# Patient Record
Sex: Male | Born: 1950
Health system: Southern US, Community
[De-identification: ages and names within clinical notes are randomized; demographics above are authoritative.]

## PROBLEM LIST (undated history)

## (undated) DIAGNOSIS — E291 Testicular hypofunction: Secondary | ICD-10-CM

## (undated) DIAGNOSIS — C61 Malignant neoplasm of prostate: Secondary | ICD-10-CM

## (undated) DIAGNOSIS — C801 Malignant (primary) neoplasm, unspecified: Secondary | ICD-10-CM

## (undated) DIAGNOSIS — N200 Calculus of kidney: Secondary | ICD-10-CM

## (undated) DIAGNOSIS — E669 Obesity, unspecified: Secondary | ICD-10-CM

## (undated) DIAGNOSIS — G5600 Carpal tunnel syndrome, unspecified upper limb: Secondary | ICD-10-CM

## (undated) DIAGNOSIS — I10 Essential (primary) hypertension: Secondary | ICD-10-CM

## (undated) DIAGNOSIS — N182 Chronic kidney disease, stage 2 (mild): Secondary | ICD-10-CM

## (undated) DIAGNOSIS — Z87442 Personal history of urinary calculi: Secondary | ICD-10-CM

## (undated) DIAGNOSIS — E785 Hyperlipidemia, unspecified: Secondary | ICD-10-CM

## (undated) DIAGNOSIS — E119 Type 2 diabetes mellitus without complications: Secondary | ICD-10-CM

## (undated) HISTORY — DX: Hyperlipidemia, unspecified: E78.5

## (undated) HISTORY — DX: Personal history of urinary calculi: Z87.442

## (undated) HISTORY — DX: Testicular hypofunction: E29.1

## (undated) HISTORY — DX: Essential (primary) hypertension: I10

## (undated) HISTORY — DX: Malignant neoplasm of prostate: C61

## (undated) HISTORY — DX: Malignant (primary) neoplasm, unspecified: C80.1

## (undated) HISTORY — DX: Calculus of kidney: N20.0

## (undated) HISTORY — DX: Obesity, unspecified: E66.9

## (undated) HISTORY — DX: Type 2 diabetes mellitus without complications: E11.9

## (undated) HISTORY — DX: Chronic kidney disease, stage 2 (mild): N18.2

## (undated) HISTORY — PX: COLONOSCOPY: SHX174

## (undated) HISTORY — PX: LITHOTRIPSY: SUR834

---
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
## 2008-05-22 DIAGNOSIS — R6882 Decreased libido: Secondary | ICD-10-CM | POA: Insufficient documentation

## 2008-06-05 DIAGNOSIS — E291 Testicular hypofunction: Secondary | ICD-10-CM | POA: Insufficient documentation

## 2009-06-16 DIAGNOSIS — I10 Essential (primary) hypertension: Secondary | ICD-10-CM | POA: Insufficient documentation

## 2009-09-11 ENCOUNTER — Ambulatory Visit: Payer: Self-pay | Admitting: Surgery

## 2009-09-17 ENCOUNTER — Ambulatory Visit: Payer: Self-pay | Admitting: Surgery

## 2009-09-18 LAB — PATHOLOGY REPORT

## 2012-09-20 ENCOUNTER — Emergency Department: Payer: Self-pay | Admitting: Emergency Medicine

## 2012-09-20 LAB — COMPREHENSIVE METABOLIC PANEL
Albumin: 4.3 g/dL (ref 3.4–5.0)
Alkaline Phosphatase: 65 U/L (ref 50–136)
Anion Gap: 5 — ABNORMAL LOW (ref 7–16)
BUN: 23 mg/dL — ABNORMAL HIGH (ref 7–18)
Bilirubin,Total: 0.5 mg/dL (ref 0.2–1.0)
Calcium, Total: 9.4 mg/dL (ref 8.5–10.1)
Chloride: 104 mmol/L (ref 98–107)
Co2: 29 mmol/L (ref 21–32)
Creatinine: 1.83 mg/dL — ABNORMAL HIGH (ref 0.60–1.30)
EGFR (African American): 46 — ABNORMAL LOW
EGFR (Non-African Amer.): 39 — ABNORMAL LOW
Glucose: 166 mg/dL — ABNORMAL HIGH (ref 65–99)
Osmolality: 283 (ref 275–301)
Potassium: 4.4 mmol/L (ref 3.5–5.1)
SGOT(AST): 43 U/L — ABNORMAL HIGH (ref 15–37)
SGPT (ALT): 30 U/L (ref 12–78)
Sodium: 138 mmol/L (ref 136–145)
Total Protein: 7.8 g/dL (ref 6.4–8.2)

## 2012-09-20 LAB — URINALYSIS, COMPLETE
Bacteria: NONE SEEN
Bilirubin,UR: NEGATIVE
Glucose,UR: NEGATIVE mg/dL (ref 0–75)
Ketone: NEGATIVE
Nitrite: NEGATIVE
Ph: 6 (ref 4.5–8.0)
Protein: 25
RBC,UR: 1018 /HPF (ref 0–5)
Specific Gravity: 1.01 (ref 1.003–1.030)
Squamous Epithelial: <1
WBC UR: 11 /HPF (ref 0–5)

## 2012-09-20 LAB — CBC
HCT: 45.5 % (ref 40.0–52.0)
HGB: 15.2 g/dL (ref 13.0–18.0)
MCH: 27 pg (ref 26.0–34.0)
MCHC: 33.3 g/dL (ref 32.0–36.0)
MCV: 81 fL (ref 80–100)
Platelet: 256 10*3/uL (ref 150–440)
RBC: 5.62 10*6/uL (ref 4.40–5.90)
RDW: 13.8 % (ref 11.5–14.5)
WBC: 11.6 10*3/uL — ABNORMAL HIGH (ref 3.8–10.6)

## 2012-09-20 LAB — LIPASE, BLOOD: Lipase: 71 U/L — ABNORMAL LOW (ref 73–393)

## 2012-09-21 ENCOUNTER — Ambulatory Visit: Payer: Self-pay | Admitting: Urology

## 2012-10-06 ENCOUNTER — Ambulatory Visit: Payer: Self-pay | Admitting: Urology

## 2012-10-08 DIAGNOSIS — N200 Calculus of kidney: Secondary | ICD-10-CM | POA: Insufficient documentation

## 2014-04-26 NOTE — Consult Note (Signed)
PATIENT NAME:  John Benson, John Benson MR#:  [id number] DATE OF BIRTH:  1950-07-31  DATE OF CONSULTATION:  09/20/2012  REFERRING PHYSICIAN:  Dr. Thomasene Lot CONSULTING PHYSICIAN:  Nicki Reaper C. Bernardo Heater, MD  PLACE OF CONSULTATION:  Emergency Department.  REASON FOR CONSULTATION:  Right ureteral calculus.   HISTORY OF PRESENT ILLNESS:  A 64 year old male who states early this morning he had acute onset of severe right flank pain that radiated to the right upper quadrant. Pain was described as intermittent sharp and stabbing and associated with nausea and vomiting. There were no identifiable precipitating, aggravating or alleviating factors. He denies frequency, urgency or dysuria. Due to severity of pain, he presented to the Emergency Department and a stone protocol CT of the abdomen and pelvis was performed which was remarkable for a 7 x 10 mm right UPJ stone with hydronephrosis, renal edema and perinephric stranding. He was also noted to have bilateral, nonobstructing renal calculi. He denies any previous history of urologic problems or stone disease. He has noted since December 2013, intermittent episodes of nausea and vomiting and occasional small amount of hematuria. His pain is presently controlled.   PAST MEDICAL HISTORY: 1.  Hypertension.  2.  Type 2 diabetes mellitus-after each on no medications. 3.  External hemorrhoidectomy 2011.  PAST SURGICAL HISTORY:  None.   MEDICATIONS ON ADMISSION:  None.  ALLERGIES:  TESTOSTERONE GEL (HIVES).   SOCIAL HISTORY:  The patient is married. No tobacco or alcohol use.   REVIEW OF SYSTEMS: GENERAL:  Denies fevers, chills  EYES:  No visual changes.  HEENT:  No tinnitus, compose nasal drip.  RESPIRATORY:  Denies cough, shortness of breath, wheezing.  CARDIOVASCULAR:  History of hypertension, on no medications. No chest pain, palpitations.  GASTROINTESTINAL: Positive nausea, vomiting, abdominal pain as per the HPI.  GENITOURINARY:  As per the HPI.  ENDOCRINE:  Diet-controlled type 2 diabetes. HEMATOLOGIC:  No history of bleeding or clotting disorders. INTEGUMENT:  No rashes.  MUSCULOSKELETAL:  No arthritis, joint pain.  NEUROLOGIC:  No seizure, TIA.  PSYCHIATRIC:  No anxiety or depression.   PHYSICAL EXAMINATION: GENERAL:  The patient is in no acute distress.  HEENT: Oral cavity, oropharynx is clear.  NECK:  Supple without adenopathy.  CARDIOVASCULAR:  Regular rate and rhythm without murmur.  LUNGS:  Clear to auscultation.  ABDOMEN:  Soft with mild right lower quadrant tenderness.  BACK:  Mild right CVA tenderness. NEUROLOGIC:  No focal deficits.  EXTREMITIES:  No edema.   LABORATORY DATA:  WBC 11.6, creatinine 1.83. UA is yellow, 1018 RBCs, 11 WBCs.  CT of the abdomen and pelvis was reviewed.   IMPRESSION: 1.  Right ureteral calculus.  2.  Renal colic secondary to above.   RECOMMENDATION:  The above findings were discussed in detail with Mr. Sharp. He was informed that the stone will be too large to pass. Options were discussed of shockwave lithotripsy and ureteroscopy. He has elected to schedule shockwave lithotripsy and this will be performed on 09/21/2012. He denies use aspirin or nonsteroidal anti-inflammatory drugs or other anticoagulating agents. Potential complications were discussed including incomplete or inadequate stone fragmentation which could require repeat procedure or stent placement, ureteroscopy. Small chance of pararenal bleeding requiring transfusion or embolization was discussed. He indicated all questions were answered to his satisfaction and desires to proceed.     ____________________________ Ronda Fairly Bernardo Heater, MD scs:ce D: 09/20/2012 16:02:05 ET T: 09/20/2012 17:13:32 ET JOB#: [id number]  cc: Nicki Reaper C. Bernardo Heater, MD, <Dictator> Ronda Fairly STOIOFF MD ELECTRONICALLY SIGNED  09/27/2012 10:16 

## 2014-06-24 ENCOUNTER — Encounter: Payer: Self-pay | Admitting: Family Medicine

## 2014-06-24 ENCOUNTER — Ambulatory Visit (INDEPENDENT_AMBULATORY_CARE_PROVIDER_SITE_OTHER): Payer: BLUE CROSS/BLUE SHIELD | Admitting: Family Medicine

## 2014-06-24 VITALS — BP 138/74 | HR 63 | Temp 97.6°F | Resp 18 | Ht 69.0 in | Wt 224.3 lb

## 2014-06-24 DIAGNOSIS — R972 Elevated prostate specific antigen [PSA]: Secondary | ICD-10-CM

## 2014-06-24 DIAGNOSIS — C61 Malignant neoplasm of prostate: Secondary | ICD-10-CM | POA: Insufficient documentation

## 2014-06-24 DIAGNOSIS — E119 Type 2 diabetes mellitus without complications: Secondary | ICD-10-CM | POA: Diagnosis not present

## 2014-06-24 DIAGNOSIS — I10 Essential (primary) hypertension: Secondary | ICD-10-CM | POA: Diagnosis not present

## 2014-06-24 LAB — GLUCOSE, POCT (MANUAL RESULT ENTRY): POC Glucose: 98 mg/dl (ref 70–99)

## 2014-06-24 LAB — POCT GLYCOSYLATED HEMOGLOBIN (HGB A1C): Hemoglobin A1C: 5.7

## 2014-06-24 NOTE — Progress Notes (Signed)
Name: John Benson   MRN: [id number]    DOB: April 06, 1950   Date:06/24/2014       Progress Note  Subjective  Chief Complaint  Chief Complaint  Patient presents with  . Diabetes  . Obesity  . Hypertension    Diabetes He presents for his follow-up diabetic visit. He has type 2 diabetes mellitus. No MedicAlert identification noted. Pertinent negatives for hypoglycemia include no dizziness, headaches, nervousness/anxiousness, seizures or tremors. Pertinent negatives for diabetes include no blurred vision, no chest pain, no fatigue, no polydipsia, no polyphagia, no weakness and no weight loss. Symptoms are stable. There are no diabetic complications. Risk factors for coronary artery disease include diabetes mellitus, dyslipidemia and obesity. His weight is decreasing steadily. He is following a diabetic diet. He participates in exercise weekly. His breakfast blood glucose range is generally 90-110 mg/dl. An ACE inhibitor/angiotensin II receptor blocker is not being taken.  Hypertension This is a chronic problem. The problem has been resolved since onset. The problem is controlled. Associated symptoms include anxiety. Pertinent negatives include no blurred vision, chest pain, headaches, neck pain, orthopnea, palpitations or shortness of breath. There are no associated agents to hypertension. Risk factors for coronary artery disease include diabetes mellitus, dyslipidemia and obesity. Past treatments include nothing. The current treatment provides significant improvement. Compliance problems include diet and exercise.   Hyperlipidemia This is a chronic problem. The current episode started more than 1 year ago. The problem is controlled. Recent lipid tests were reviewed and are high. Factors aggravating his hyperlipidemia include fatty foods. Pertinent negatives include no chest pain, focal weakness, myalgias or shortness of breath. He is currently on no antihyperlipidemic treatment. The current treatment  provides moderate improvement of lipids. Compliance problems include adherence to exercise and adherence to diet.  Risk factors for coronary artery disease include diabetes mellitus, dyslipidemia and hypertension.   Elevated PSA.  Patient has elevated PSA of 4.2. He had a urology appointment but was unable to keep it because of lack of insurance coverage which should begin in July. He currently is asymptomatic.   Past Medical History  Diagnosis Date  . Hypogonadism in male   . Hypertension   . Diabetes mellitus without complication   . Hyperlipidemia   . Obesity     History  Substance Use Topics  . Smoking status: Never Smoker   . Smokeless tobacco: Not on file  . Alcohol Use: No     Current outpatient prescriptions:  .  BAYER CONTOUR NEXT TEST test strip, TEST BLOOD SUGAR once daily or as directed by prescriber, Disp: , Rfl: 1  Allergies  Allergen Reactions  . Testosterone Itching and Rash    Review of Systems  Constitutional: Negative for fever, chills, weight loss and fatigue.  HENT: Negative for congestion, hearing loss, sore throat and tinnitus.   Eyes: Negative for blurred vision, double vision and redness.  Respiratory: Negative for cough, hemoptysis and shortness of breath.   Cardiovascular: Negative for chest pain, palpitations, orthopnea, claudication and leg swelling.  Gastrointestinal: Negative for heartburn, nausea, vomiting, diarrhea, constipation and blood in stool.  Genitourinary: Negative for dysuria, urgency, frequency and hematuria.  Musculoskeletal: Negative for myalgias, back pain, joint pain, falls and neck pain.  Skin: Negative for itching.  Neurological: Negative for dizziness, tingling, tremors, focal weakness, seizures, loss of consciousness, weakness and headaches.  Endo/Heme/Allergies: Negative for polydipsia and polyphagia. Does not bruise/bleed easily.  Psychiatric/Behavioral: Negative for depression and substance abuse. The patient is not  nervous/anxious and does  not have insomnia.      Objective  Filed Vitals:   06/24/14 1148  BP: 138/74  Pulse: 63  Temp: 97.6 F (36.4 C)  Resp: 18  Height: 5\' 9"  (1.753 m)  Weight: 224 lb 5 oz (101.747 kg)  SpO2: 97%     Physical Exam  Constitutional: He is oriented to person, place, and time and well-developed, well-nourished, and in no distress.  Obese  HENT:  Head: Normocephalic.  Eyes: EOM are normal. Pupils are equal, round, and reactive to light.  Neck: Normal range of motion. Neck supple. No thyromegaly present.  Cardiovascular: Normal rate, regular rhythm and normal heart sounds.   No murmur heard. Pulmonary/Chest: Effort normal and breath sounds normal. No respiratory distress. He has no wheezes.  Abdominal: Soft. Bowel sounds are normal.  Musculoskeletal: Normal range of motion. He exhibits no edema.  Lymphadenopathy:    He has no cervical adenopathy.  Neurological: He is alert and oriented to person, place, and time. No cranial nerve deficit. Gait normal. Coordination normal.  Skin: Skin is warm and dry. No rash noted.  Psychiatric: Affect and judgment normal.      Assessment & Plan  1. Type 2 diabetes mellitus without complication Well-controlled with diet and exercise alone - POCT HgB A1C - POCT Glucose (CBG)  2. Essential hypertension Well-controlled with diet and exercise alone  3. PSA elevation Needs referral to urologist  4. Obesity  Again encourage diet and exercise compliance4.

## 2014-06-24 NOTE — Patient Instructions (Signed)
F/U 4 MO 

## 2014-07-15 ENCOUNTER — Encounter: Payer: Self-pay | Admitting: *Deleted

## 2014-07-15 DIAGNOSIS — E291 Testicular hypofunction: Secondary | ICD-10-CM | POA: Insufficient documentation

## 2014-07-15 DIAGNOSIS — E119 Type 2 diabetes mellitus without complications: Secondary | ICD-10-CM | POA: Insufficient documentation

## 2014-07-15 DIAGNOSIS — B351 Tinea unguium: Secondary | ICD-10-CM | POA: Insufficient documentation

## 2014-07-15 DIAGNOSIS — E782 Mixed hyperlipidemia: Secondary | ICD-10-CM | POA: Insufficient documentation

## 2014-07-15 DIAGNOSIS — R739 Hyperglycemia, unspecified: Secondary | ICD-10-CM | POA: Insufficient documentation

## 2014-07-15 DIAGNOSIS — E785 Hyperlipidemia, unspecified: Secondary | ICD-10-CM | POA: Insufficient documentation

## 2014-07-15 DIAGNOSIS — E669 Obesity, unspecified: Secondary | ICD-10-CM | POA: Insufficient documentation

## 2014-07-15 DIAGNOSIS — Z1389 Encounter for screening for other disorder: Secondary | ICD-10-CM | POA: Insufficient documentation

## 2014-07-15 DIAGNOSIS — G5600 Carpal tunnel syndrome, unspecified upper limb: Secondary | ICD-10-CM

## 2014-07-15 DIAGNOSIS — N4340 Spermatocele of epididymis, unspecified: Secondary | ICD-10-CM | POA: Insufficient documentation

## 2014-07-15 HISTORY — DX: Carpal tunnel syndrome, unspecified upper limb: G56.00

## 2014-07-16 ENCOUNTER — Ambulatory Visit (INDEPENDENT_AMBULATORY_CARE_PROVIDER_SITE_OTHER): Payer: 59 | Admitting: Urology

## 2014-07-16 ENCOUNTER — Encounter: Payer: Self-pay | Admitting: Urology

## 2014-07-16 VITALS — BP 143/77 | HR 69 | Ht 69.5 in | Wt 226.2 lb

## 2014-07-16 DIAGNOSIS — R972 Elevated prostate specific antigen [PSA]: Secondary | ICD-10-CM

## 2014-07-16 NOTE — Addendum Note (Signed)
Addended by: Orlene Erm on: 07/16/2014 02:21 PM   Modules accepted: Orders

## 2014-07-16 NOTE — Progress Notes (Signed)
I have been asked to see the patient by Dr. Ashok Norris, for evaluation and management of elevated PSA.  History of present illness: Patient was found to have an elevated PSA which was drawn as part of a prostate cancer screening.  He has no family history of prostate cancer.  The patient denies any bone pain, new back pain, or lower extremity edema.  The patient denies any changes in his voiding symptoms over the last 6 months.  Specifically he denies dysuria or hematuria.  The patient has also been seen and treated for hypogonadism in the past but was allergic to testosterone (itching)  PSA History: 4.2 (within last year)  IPSS: 1, QoL 2 SHIM:  19  Review of systems: A 12 point comprehensive review of systems was obtained and is negative unless otherwise stated in the history of present illness.  Patient Active Problem List   Diagnosis Date Noted  . Carpal tunnel syndrome 07/15/2014  . Screening for gout 07/15/2014  . 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 Well controlled diabetes mellitus 07/15/2014  . Blood glucose elevated 07/15/2014  . HLD (hyperlipidemia) 07/15/2014  . Male hypogonadism 07/15/2014  . Adiposity 07/15/2014  . Spermatocele 07/15/2014  . Type 2 diabetes mellitus without complication 23/55/7322  . HTN (hypertension) 06/24/2014  . PSA elevation 06/24/2014  . Calculus of kidney 10/08/2012  . Benign essential HTN 06/16/2009  . Testicular hypofunction 06/05/2008  . Decreased libido 05/22/2008    Current Outpatient Prescriptions on File Prior to Visit  Medication Sig Dispense Refill  . BAYER CONTOUR NEXT TEST test strip TEST BLOOD SUGAR once daily or as directed by prescriber  1   No current facility-administered medications on file prior to visit.    Past Medical History  Diagnosis Date  . Hypogonadism in male   . Hypertension   . Diabetes mellitus without complication   . Hyperlipidemia   . Obesity     No past surgical history on file.  History   Substance Use Topics  . Smoking status: Never Smoker   . Smokeless tobacco: Not on file  . Alcohol Use: No    Family History  Problem Relation Age of Onset  . Diabetes Mother   . Cancer Mother   . Hypertension Father   . Dementia Father   . Prostate cancer Brother     PE: There were no vitals filed for this visit. Patient appears to be in no acute distress  patient is alert and oriented x3 Atraumatic normocephalic head No cervical or supraclavicular lymphadenopathy appreciated No increased work of breathing, no audible wheezes/rhonchi Abdomen is soft, nontender, nondistended, no CVA or suprapubic tenderness Prostate is normal in size, 40 g approximately, symmetric without nodularity Lower extremities are symmetric without appreciable edema Grossly neurologically intact No identifiable skin lesions  No results for input(s): WBC, HGB, HCT in the last 72 hours. No results for input(s): NA, K, CL, CO2, GLUCOSE, BUN, CREATININE, CALCIUM in the last 72 hours. No results for input(s): LABPT, INR in the last 72 hours. No results for input(s): LABURIN in the last 72 hours. No results found for this or any previous visit.  Imaging: none  Imp: 64 year old otherwise reasonably healthy patient with an elevated PSA. Ideal have the exact date when his last PSA was drawn but the patient thinks that this was an the last 6 months. Concerning to me, is that he also has been treated for testosterone supplementation. If the patient has low testosterone and an elevated PSA this increases  his risk for having prostate cancer. Recommendations: I discussed the patient's elevated PSA and explained to him the implications thereof. I explained to the patient the potential etiology of an elevated PSA of which prostate cancer is one of that is most concerning.  I also explained to him that younger healthier patient's like him are the ones that we treat most aggressively. However, our first step would be  to repeat the PSA today. Which we have done. I have also asked that we check a testosterone level. If the patient's PSA remains elevated, we discussed performing a prostate biopsy. I went over the prostate biopsy procedure in detail as well as the risks and benefits. I explained to the patient the risks of infection and the need for potentially requiring IV antibodies and hospital admission. If the patient's PSA is low then we might be able to repeat the PSA in 3-6 months. We will keep in touch with the patient in regards to his results.  CC: Dr. Starleen Blue, M.D. John Benson W

## 2014-08-21 ENCOUNTER — Telehealth: Payer: Self-pay | Admitting: Obstetrics and Gynecology

## 2014-08-21 LAB — PSA TOTAL (REFLEX TO FREE): Prostate Specific Ag, Serum: 5.3 ng/mL — ABNORMAL HIGH (ref 0.0–4.0)

## 2014-08-21 LAB — FPSA% REFLEX
% FREE PSA: 10.4 %
PSA, FREE: 0.55 ng/mL

## 2014-08-21 LAB — TESTOSTERONE,FREE AND TOTAL
Testosterone, Free: 5 pg/mL — ABNORMAL LOW (ref 6.6–18.1)
Testosterone: 136 ng/dL — ABNORMAL LOW (ref 348–1197)

## 2014-08-21 NOTE — Telephone Encounter (Signed)
Mail pt information. Attempted to contact pt to find out who rx ASA, was unable to reach pt. Made a note on paperwork for pt to contact [facility] with information needed.

## 2014-08-21 NOTE — Telephone Encounter (Signed)
Spoke with patient concerning last PSA result of 5.3 Free 10.4%.  Per Dr. Carlton Adam last visit note patient was recommended to have prostate biopsy if PSA did not decrease.  It has increased since last check.  Significance of results explained to patient and he is agreeable to scheduling biopsy.   I told him that we would mail him information on prostate biopsy, including prep and potential complications. Please send information to patient and forward to [facility] to schedule appt for biopsy. If during work hours call patient's cell#

## 2014-08-23 NOTE — Telephone Encounter (Signed)
LMOM

## 2014-08-23 NOTE — Telephone Encounter (Signed)
Will you please try and call patient again about the ASA.  I don't want to postpone his biopsy any longer than we have to.  Also I just wanted to be sure that you forwarded this message to [facility] to schedule.  Thanks

## 2014-08-27 NOTE — Telephone Encounter (Signed)
Working on Systems developer from PCP to stop ASA for bx.

## 2014-08-27 NOTE — Telephone Encounter (Signed)
Tried to call pt but his cell phone would not let me leave a message   Thanks, Sharyn Lull

## 2014-08-27 NOTE — Telephone Encounter (Signed)
Spoke with pt in reference to ASA. Pt stated Dr. Rutherford Nail prescribed the ASA.  Can you please schedule?

## 2014-08-28 ENCOUNTER — Telehealth: Payer: Self-pay | Admitting: Family Medicine

## 2014-08-28 NOTE — Telephone Encounter (Signed)
John Benson from [facility] (dr Louis Meckel) just giving you heads up that she will be faxing a form for patient to stop aspirin. Requesting that you sign and fax back possibly today. Thank you

## 2014-08-29 ENCOUNTER — Telehealth: Payer: Self-pay | Admitting: Urology

## 2014-08-29 NOTE — Telephone Encounter (Signed)
Called patient to notify him that I was able to get clearance from Dr. Rutherford Nail for him to stop his ASA 81mg  7days prior to a prostate biopsy and we could now get him scheduled for this procedure. Patient states that he does not want to have this done he did research online and does not want to proceed. I explained in detail how the procedure is preformed and that if he though he could not tolerate in the office that we could schedule for this to be done in the OR. I explained the importance of a prostate biopsy and the fact that it was to rule out prostate cancer and the concern we had for cancer based on his elevated PSA. Patient states that he understands the risks involved but would not like to proceed.  Please send a certified letter to the patient in regards to this Thanks

## 2014-08-29 NOTE — Telephone Encounter (Signed)
Has been signed and faxed

## 2014-09-03 ENCOUNTER — Ambulatory Visit: Payer: 59

## 2014-10-28 ENCOUNTER — Encounter: Payer: Self-pay | Admitting: Family Medicine

## 2014-10-28 ENCOUNTER — Other Ambulatory Visit: Payer: Self-pay | Admitting: Family Medicine

## 2014-10-28 ENCOUNTER — Ambulatory Visit (INDEPENDENT_AMBULATORY_CARE_PROVIDER_SITE_OTHER): Payer: 59 | Admitting: Family Medicine

## 2014-10-28 VITALS — BP 110/64 | HR 65 | Temp 97.2°F | Resp 18 | Ht 70.0 in | Wt 237.5 lb

## 2014-10-28 DIAGNOSIS — E669 Obesity, unspecified: Secondary | ICD-10-CM

## 2014-10-28 DIAGNOSIS — E1169 Type 2 diabetes mellitus with other specified complication: Secondary | ICD-10-CM | POA: Diagnosis not present

## 2014-10-28 DIAGNOSIS — E785 Hyperlipidemia, unspecified: Secondary | ICD-10-CM

## 2014-10-28 LAB — POCT UA - MICROALBUMIN: Microalbumin Ur, POC: 20 mg/L

## 2014-10-28 LAB — POCT GLYCOSYLATED HEMOGLOBIN (HGB A1C): Hemoglobin A1C: 5.9

## 2014-10-28 LAB — GLUCOSE, POCT (MANUAL RESULT ENTRY): POC Glucose: 115 mg/dl — AB (ref 70–99)

## 2014-10-28 NOTE — Progress Notes (Signed)
Name: John Benson   MRN: [id number]    DOB: 1950-03-01   Date:10/28/2014       Progress Note  Subjective  Chief Complaint  Chief Complaint  Patient presents with  . Diabetes    4 month follow up   . Hyperlipidemia    HPI  Diabetes  Patient presents for follow-up of diabetes which is present for over 5 years. Is currently on a regimen of diet and exercise . Patient states is compliant with their diet and exercise. There's been no hypoglycemic episodes and there has been no polyuria polydipsia polyphagia. His average fasting glucoses been in the low around low 100s with a high around mid 100s . There is no end organ disease.  Last diabetic eye exam was about a year ago.   Last visit with dietitian was over one year ago. Last microalbumin was obtained October 2015 and was 0 .   Hyperlipidemia  Patient has a history of hyperlipidemia for over 5 years.  Current medical regimen consist of diet and exercise usually. .  Compliance is usually. .  Diet and exercise are currently followed well .  Risk factors for cardiovascular disease include hyperlipidemia and obesity .   There have been no side effects from the medication.      Past Medical History  Diagnosis Date  . Hypogonadism in male   . Hypertension   . Diabetes mellitus without complication ([place])   . Hyperlipidemia   . Obesity   . History of kidney stones     Social History  Substance Use Topics  . Smoking status: Never Smoker   . Smokeless tobacco: Not on file  . Alcohol Use: No     Current outpatient prescriptions:  .  aspirin (ASPIRIN ADULT LOW STRENGTH) 81 MG EC tablet, Take by mouth., Disp: , Rfl:  .  BAYER CONTOUR NEXT TEST test strip, TEST BLOOD SUGAR once daily or as directed by prescriber, Disp: , Rfl: 1  Allergies  Allergen Reactions  . Testosterone Itching and Rash    Review of Systems  Constitutional: Negative for fever, chills and weight loss.  HENT: Negative for congestion, hearing loss, sore  throat and tinnitus.   Eyes: Negative for blurred vision, double vision and redness.  Respiratory: Negative for cough, hemoptysis and shortness of breath.   Cardiovascular: Negative for chest pain, palpitations, orthopnea, claudication and leg swelling.  Gastrointestinal: Negative for heartburn, nausea, vomiting, diarrhea, constipation and blood in stool.  Genitourinary: Negative for dysuria, urgency, frequency and hematuria.  Musculoskeletal: Negative for myalgias, back pain, joint pain, falls and neck pain.  Skin: Negative for itching.  Neurological: Negative for dizziness, tingling, tremors, focal weakness, seizures, loss of consciousness, weakness and headaches.  Endo/Heme/Allergies: Does not bruise/bleed easily.  Psychiatric/Behavioral: Negative for depression and substance abuse. The patient is not nervous/anxious and does not have insomnia.      Objective  Filed Vitals:   10/28/14 0815  BP: 110/64  Pulse: 65  Temp: 97.2 F (36.2 C)  TempSrc: Oral  Resp: 18  Height: 5\' 10"  (1.778 m)  Weight: 237 lb 8 oz (107.729 kg)  SpO2: 96%     Physical Exam  Constitutional: He is oriented to person, place, and time and well-developed, well-nourished, and in no distress.  HENT:  Head: Normocephalic.  Eyes: EOM are normal. Pupils are equal, round, and reactive to light.  Neck: Normal range of motion. Neck supple. No thyromegaly present.  Cardiovascular: Normal rate, regular rhythm and normal heart sounds.  No murmur heard. Pulmonary/Chest: Effort normal and breath sounds normal. No respiratory distress. He has no wheezes.  Abdominal: Soft. Bowel sounds are normal.  Musculoskeletal: Normal range of motion. He exhibits no edema.  Lymphadenopathy:    He has no cervical adenopathy.  Neurological: He is alert and oriented to person, place, and time. No cranial nerve deficit. Gait normal. Coordination normal.  Skin: Skin is warm and dry. No rash noted.  Psychiatric: Affect and judgment  normal.      Assessment & Plan  1. Type 2 diabetes mellitus with other specified complication ([name]) Diet and exercise controlled - POCT HgB A1C - POCT Glucose (CBG) - POCT UA - Microalbumin - Comprehensive Metabolic Panel (CMET) - TSH  2. Obesity Encourage exercise as well as following his diet more stri - TSH  3. Hyperlipemia  - Lipid panel

## 2014-10-29 LAB — COMPREHENSIVE METABOLIC PANEL
ALT: 21 IU/L (ref 0–44)
AST: 33 IU/L (ref 0–40)
Albumin/Globulin Ratio: 1.7 (ref 1.1–2.5)
Albumin: 4.5 g/dL (ref 3.6–4.8)
Alkaline Phosphatase: 51 IU/L (ref 39–117)
BUN/Creatinine Ratio: 12 (ref 10–22)
BUN: 16 mg/dL (ref 8–27)
Bilirubin Total: 0.5 mg/dL (ref 0.0–1.2)
CO2: 24 mmol/L (ref 18–29)
Calcium: 9.7 mg/dL (ref 8.6–10.2)
Chloride: 101 mmol/L (ref 97–106)
Creatinine, Ser: 1.34 mg/dL — ABNORMAL HIGH (ref 0.76–1.27)
GFR calc Af Amer: 66 mL/min/{1.73_m2} (ref >59)
GFR calc non Af Amer: 57 mL/min/{1.73_m2} — ABNORMAL LOW (ref >59)
Globulin, Total: 2.7 g/dL (ref 1.5–4.5)
Glucose: 118 mg/dL — ABNORMAL HIGH (ref 65–99)
Potassium: 5.1 mmol/L (ref 3.5–5.2)
Sodium: 141 mmol/L (ref 136–144)
Total Protein: 7.2 g/dL (ref 6.0–8.5)

## 2014-10-29 LAB — LIPID PANEL
Chol/HDL Ratio: 3.9 ratio units (ref 0.0–5.0)
Cholesterol, Total: 260 mg/dL — ABNORMAL HIGH (ref 100–199)
HDL: 67 mg/dL (ref >39)
LDL Calculated: 169 mg/dL — ABNORMAL HIGH (ref 0–99)
Triglycerides: 120 mg/dL (ref 0–149)
VLDL Cholesterol Cal: 24 mg/dL (ref 5–40)

## 2014-10-29 LAB — TSH: TSH: 1.24 u[IU]/mL (ref 0.450–4.500)

## 2015-04-29 ENCOUNTER — Ambulatory Visit: Payer: 59 | Admitting: Family Medicine

## 2015-05-06 ENCOUNTER — Ambulatory Visit: Payer: 59 | Admitting: Family Medicine

## 2015-06-30 ENCOUNTER — Encounter: Payer: Self-pay | Admitting: Family Medicine

## 2015-06-30 ENCOUNTER — Ambulatory Visit (INDEPENDENT_AMBULATORY_CARE_PROVIDER_SITE_OTHER): Payer: 59 | Admitting: Family Medicine

## 2015-06-30 VITALS — BP 140/70 | HR 69 | Temp 97.9°F | Resp 18 | Ht 70.0 in | Wt 256.9 lb

## 2015-06-30 DIAGNOSIS — E1165 Type 2 diabetes mellitus with hyperglycemia: Secondary | ICD-10-CM

## 2015-06-30 DIAGNOSIS — E669 Obesity, unspecified: Secondary | ICD-10-CM | POA: Diagnosis not present

## 2015-06-30 DIAGNOSIS — E785 Hyperlipidemia, unspecified: Secondary | ICD-10-CM | POA: Diagnosis not present

## 2015-06-30 DIAGNOSIS — I10 Essential (primary) hypertension: Secondary | ICD-10-CM

## 2015-06-30 DIAGNOSIS — R972 Elevated prostate specific antigen [PSA]: Secondary | ICD-10-CM

## 2015-06-30 DIAGNOSIS — IMO0001 Reserved for inherently not codable concepts without codable children: Secondary | ICD-10-CM

## 2015-06-30 LAB — POCT GLYCOSYLATED HEMOGLOBIN (HGB A1C): Hemoglobin A1C: 7.2

## 2015-06-30 LAB — GLUCOSE, POCT (MANUAL RESULT ENTRY): POC Glucose: 133 mg/dl — AB (ref 70–99)

## 2015-07-01 NOTE — Progress Notes (Signed)
Date:  06/30/2015   Name:  John Benson   DOB:  04/01/1950   MRN:  [id number]  PCP:  Ashok Norris, MD    Chief Complaint: Diabetes and Hyperlipidemia   History of Present Illness:  This is a 65 y.o. male seen for 8 month f/u. Hx T2DM with diet control, on asa and mvit only. Hx HTN and HLD on no meds. Weight up 19# since last visit, admits not exercising regularly and eating poorly. Had elevated PSA, saw urology last year.  Review of Systems:  Review of Systems  Constitutional: Negative for fever and fatigue.  Respiratory: Negative for cough and shortness of breath.   Cardiovascular: Negative for chest pain and leg swelling.  Endocrine: Negative for polyuria.  Genitourinary: Negative for difficulty urinating.  Neurological: Negative for syncope and light-headedness.    Patient Active Problem List   Diagnosis Date Noted  . Carpal tunnel syndrome 07/15/2014  . Screening for gout 07/15/2014  . 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 Well controlled diabetes mellitus ([name]) 07/15/2014  . Blood glucose elevated 07/15/2014  . HLD (hyperlipidemia) 07/15/2014  . Male hypogonadism 07/15/2014  . Obesity, Class II, BMI 35-39.9 07/15/2014  . Spermatocele 07/15/2014  . Type 2 diabetes mellitus without complication ([name]) [id number]  . HTN (hypertension) 06/24/2014  . PSA elevation 06/24/2014  . Calculus of kidney 10/08/2012  . Benign essential HTN 06/16/2009  . Testicular hypofunction 06/05/2008  . Decreased libido 05/22/2008    Prior to Admission medications   Medication Sig Start Date End Date Taking? Authorizing Provider  aspirin (ASPIRIN ADULT LOW STRENGTH) 81 MG EC tablet Take by mouth. 11/01/13   Historical Provider, MD  BAYER CONTOUR NEXT TEST test strip TEST BLOOD SUGAR once daily or as directed by prescriber 06/12/14   Historical Provider, MD    Allergies  Allergen Reactions  . Testosterone Itching and Rash    Past Surgical History  Procedure Laterality Date  .  Lithotripsy      Social History  Substance Use Topics  . Smoking status: Never Smoker   . Smokeless tobacco: None  . Alcohol Use: No    Family History  Problem Relation Age of Onset  . Diabetes Mother   . Cancer Mother     breast  . Hypertension Father   . Dementia Father   . Diabetes Brother   . Kidney cancer Neg Hx   . Prostate cancer Neg Hx   . Pancreatic disease Brother     Medication list has been reviewed and updated.  Physical Examination: BP 140/70 mmHg  Pulse 69  Temp(Src) 97.9 F (36.6 C) (Oral)  Resp 18  Ht 5\' 10"  (1.778 m)  Wt 256 lb 14.4 oz (116.529 kg)  BMI 36.86 kg/m2  SpO2 98%  Physical Exam  Constitutional: He appears well-developed and well-nourished.  Cardiovascular: Normal rate, regular rhythm and normal heart sounds.   Pulmonary/Chest: Effort normal and breath sounds normal.  Musculoskeletal: He exhibits no edema.  Neurological: He is alert.  Skin: Skin is warm and dry.  Psychiatric: He has a normal mood and affect. His behavior is normal.  Nursing note and vitals reviewed.   Assessment and Plan:  1. Uncontrolled type 2 diabetes mellitus without complication, without long-term current use of insulin ([name]) A1c 7.2% today (5.9% in October) likely due to weight gain, recommended exercise/weight loss, consider metformin if fails to improve - POCT Glucose (CBG) - POCT HgB A1C  2. Essential hypertension Marginal control off meds, consider ACEI if persists  3. PSA elevation Followed by urology  4. HLD (hyperlipidemia) Marginal control in October, consider Lipitor next visit (10 yr CVR 19%)  5. Obesity, Class II, BMI 35-39.9 Exercise/weight loss discussed  Return in about 3 months (around 09/30/2015).  [name]. [facility]  07/01/2015

## 2015-09-30 ENCOUNTER — Ambulatory Visit (INDEPENDENT_AMBULATORY_CARE_PROVIDER_SITE_OTHER): Payer: 59 | Admitting: Family Medicine

## 2015-09-30 ENCOUNTER — Encounter: Payer: Self-pay | Admitting: Family Medicine

## 2015-09-30 ENCOUNTER — Ambulatory Visit: Payer: 59 | Admitting: Family Medicine

## 2015-09-30 VITALS — BP 144/80 | HR 86 | Temp 98.0°F | Resp 16 | Wt 257.0 lb

## 2015-09-30 DIAGNOSIS — R972 Elevated prostate specific antigen [PSA]: Secondary | ICD-10-CM | POA: Diagnosis not present

## 2015-09-30 DIAGNOSIS — E785 Hyperlipidemia, unspecified: Secondary | ICD-10-CM

## 2015-09-30 DIAGNOSIS — E119 Type 2 diabetes mellitus without complications: Secondary | ICD-10-CM

## 2015-09-30 DIAGNOSIS — I10 Essential (primary) hypertension: Secondary | ICD-10-CM | POA: Diagnosis not present

## 2015-09-30 DIAGNOSIS — E669 Obesity, unspecified: Secondary | ICD-10-CM

## 2015-09-30 DIAGNOSIS — Z23 Encounter for immunization: Secondary | ICD-10-CM | POA: Diagnosis not present

## 2015-09-30 LAB — COMPLETE METABOLIC PANEL WITH GFR
ALT: 30 U/L (ref 9–46)
AST: 28 U/L (ref 10–35)
Albumin: 4.3 g/dL (ref 3.6–5.1)
Alkaline Phosphatase: 45 U/L (ref 40–115)
BUN: 20 mg/dL (ref 7–25)
CO2: 25 mmol/L (ref 20–31)
Calcium: 9.5 mg/dL (ref 8.6–10.3)
Chloride: 104 mmol/L (ref 98–110)
Creat: 1.37 mg/dL — ABNORMAL HIGH (ref 0.70–1.25)
GFR, Est African American: 63 mL/min (ref >=60)
GFR, Est Non African American: 55 mL/min — ABNORMAL LOW (ref >=60)
Glucose, Bld: 155 mg/dL — ABNORMAL HIGH (ref 65–99)
Potassium: 4.8 mmol/L (ref 3.5–5.3)
Sodium: 139 mmol/L (ref 135–146)
Total Bilirubin: 0.5 mg/dL (ref 0.2–1.2)
Total Protein: 6.8 g/dL (ref 6.1–8.1)

## 2015-09-30 LAB — PSA: PSA: 7.9 ng/mL — ABNORMAL HIGH (ref <=4.0)

## 2015-09-30 NOTE — Progress Notes (Signed)
BP (!) 144/80   Pulse 86   Temp 98 F (36.7 C) (Oral)   Resp 16   Wt 257 lb (116.6 kg)   SpO2 97%   BMI 36.88 kg/m    Subjective:    Patient ID: John Benson, male    DOB: 02-Aug-1950, 65 y.o.   MRN: [id number]  HPI: John Benson is a 65 y.o. male  Chief Complaint  Patient presents with  . Follow-up   Patient is new to me; his usual provider is out of the office for an extended time  Borderline diabetes; dealing with that for a few years; trying diet control; mother had diabetes and father had diabetes; brother has diabetes too; no sodas; really limits bread; peach tea unsweetened or half and half, half sweet and half unsweetened  Hypertension; father had hypertension  Obesity; he has started walking, then gained back; started back to warm-up; 30 minutes on treadmill; no chest pain or SHOB with exercise  Elevated PSA noted in problem list; referred to urologist upstairs and everything was fine; check PSA here  Depression screen [facility] 2/9 09/30/2015 06/30/2015 10/28/2014 06/24/2014  Decreased Interest 0 0 0 0  Down, Depressed, Hopeless 0 0 0 0  PHQ - 2 Score 0 0 0 0   Relevant past medical, surgical, family and social history reviewed Past Medical History:  Diagnosis Date  . Diabetes mellitus without complication ([place])   . History of kidney stones   . Hyperlipidemia   . Hypertension   . Hypogonadism in male   . Obesity    Past Surgical History:  Procedure Laterality Date  . LITHOTRIPSY     Family History  Problem Relation Age of Onset  . Diabetes Mother   . Cancer Mother     breast  . Hypertension Father   . Dementia Father   . Diabetes Brother   . Kidney cancer Neg Hx   . Prostate cancer Neg Hx   . Pancreatic disease Brother    Social History  Substance Use Topics  . Smoking status: Never Smoker  . Smokeless tobacco: Not on file  . Alcohol use No   Interim medical history since last visit reviewed. Allergies and medications reviewed  Review of  Systems Per HPI unless specifically indicated above     Objective:    BP (!) 144/80   Pulse 86   Temp 98 F (36.7 C) (Oral)   Resp 16   Wt 257 lb (116.6 kg)   SpO2 97%   BMI 36.88 kg/m   Wt Readings from Last 3 Encounters:  09/30/15 257 lb (116.6 kg)  06/30/15 256 lb 14.4 oz (116.5 kg)  10/28/14 237 lb 8 oz (107.7 kg)    Physical Exam  Constitutional: He appears well-developed and well-nourished. No distress.  HENT:  Head: Normocephalic and atraumatic.  Eyes: EOM are normal. No scleral icterus.  Neck: No thyromegaly present.  Cardiovascular: Normal rate and regular rhythm.   Pulmonary/Chest: Effort normal and breath sounds normal.  Abdominal: Soft. Bowel sounds are normal. He exhibits no distension.  Musculoskeletal: He exhibits no edema.  Neurological: He is alert. Coordination normal.  Skin: Skin is warm and dry. No pallor.  Psychiatric: He has a normal mood and affect. His behavior is normal. Judgment and thought content normal.   Diabetic Foot Form - Detailed   Diabetic Foot Exam - detailed Diabetic Foot exam was performed with the following findings:  Yes 09/30/2015  9:13 AM  Visual Foot Exam  completed.:  Yes  Are the toenails ingrown?:  No Normal Range of Motion:  Yes Pulse Foot Exam completed.:  Yes  Right Dorsalis Pedis:  Present Left Dorsalis Pedis:  Present  Sensory Foot Exam Completed.:  Yes Semmes-Weinstein Monofilament Test R Site 1-Great Toe:  Pos L Site 1-Great Toe:  Pos  R Site 4:  Pos L Site 4:  Pos  R Site 5:  Pos L Site 5:  Pos    Comments:  Scale on soles and sides of both feet c/w athlete's foot infection     Results for orders placed or performed in visit on 06/30/15  POCT Glucose (CBG)  Result Value Ref Range   POC Glucose 133 (A) 70 - 99 mg/dl  POCT HgB A1C  Result Value Ref Range   Hemoglobin A1C 7.2       Assessment & Plan:   Problem List Items Addressed This Visit      Cardiovascular and Mediastinum   HTN (hypertension)     Try DASH guidelines and weight loss        Endocrine   Type 2 diabetes mellitus without complication ([place]) - Primary    Foot exam by MD today; check labs today; encourage weight loss and healthy eating      Relevant Orders   COMPLETE METABOLIC PANEL WITH GFR   Hemoglobin A1c   Microalbumin / creatinine urine ratio     Other   PSA elevation    Evaluated by urologist; will check PSA here      Relevant Orders   PSA   Obesity, Class II, BMI 35-39.9    Aim for 20-30 pounds of weight loss over the coming year      HLD (hyperlipidemia)    Check lipids, avoid saturated fats      Relevant Orders   Lipid Panel w/Direct LDL:HDL Ratio    Other Visit Diagnoses    Needs flu shot       Relevant Orders   Flu Vaccine QUAD 36+ mos PF IM (Fluarix & Fluzone Quad PF) (Completed)      Follow up plan: Return in about 6 months (around 03/29/2016) for follow-up and fasting labs.  An after-visit summary was printed and given to the patient at [place].  Please see the patient instructions which may contain other information and recommendations beyond what is mentioned above in the assessment and plan.  Meds ordered this encounter  Medications  . Multiple Vitamin (MULTIVITAMIN) tablet    Sig: Take 1 tablet by mouth daily.   Orders Placed This Encounter  Procedures  . Flu Vaccine QUAD 36+ mos PF IM (Fluarix & Fluzone Quad PF)  . PSA  . COMPLETE METABOLIC PANEL WITH GFR  . Hemoglobin A1c  . Lipid Panel w/Direct LDL:HDL Ratio  . Microalbumin / creatinine urine ratio

## 2015-09-30 NOTE — Assessment & Plan Note (Signed)
Evaluated by urologist; will check PSA here

## 2015-09-30 NOTE — Patient Instructions (Addendum)
Your goal blood pressure is less than 140 mmHg on top. Try to follow the DASH guidelines (DASH stands for Dietary Approaches to Stop Hypertension) Try to limit the sodium in your diet.  Ideally, consume less than 1.5 grams (less than 1,500mg ) per day. Do not add salt when cooking or at the table.  Check the sodium amount on labels when shopping, and choose items lower in sodium when given a choice. Avoid or limit foods that already contain a lot of sodium. Eat a diet rich in fruits and vegetables and whole grains.  Try to limit saturated fats in your diet (bologna, hot dogs, barbeque, cheeseburgers, hamburgers, steak, bacon, sausage, cheese, etc.) and get more fresh fruits, vegetables, and whole grains  Please do see your eye doctor regularly, and have your eyes examined every year (or more often per his or her recommendation) Check your feet every night and let me know right away of any sores, infections, numbness, etc. Try to limit sweets, white bread, white rice, white potatoes It is okay with me for you to not check your fingerstick blood sugars (per [employer] of Endocrinology Best Practices), unless you are interested and feel it would be helpful for you  Check out the information at familydoctor.org entitled "Nutrition for Weight Loss: What You Need to Know about Fad Diets" Try to lose between 1-2 pounds per week by taking in fewer calories and burning off more calories You can succeed by limiting portions, limiting foods dense in calories and fat, becoming more active, and drinking 8 glasses of water a day (64 ounces) Don't skip meals, especially breakfast, as skipping meals may alter your metabolism Do not use over-the-counter weight loss pills or gimmicks that claim rapid weight loss A healthy BMI (or body mass index) is between 18.5 and 24.9 You can calculate your ideal BMI at the [place] website ClubMonetize.fr   DASH Eating  Plan DASH stands for "Dietary Approaches to Stop Hypertension." The DASH eating plan is a healthy eating plan that has been shown to reduce high blood pressure (hypertension). Additional health benefits may include reducing the risk of type 2 diabetes mellitus, heart disease, and stroke. The DASH eating plan may also help with weight loss. WHAT DO I NEED TO KNOW ABOUT THE DASH EATING PLAN? For the DASH eating plan, you will follow these general guidelines:  Choose foods with a percent daily value for sodium of less than 5% (as listed on the food label).  Use salt-free seasonings or herbs instead of table salt or sea salt.  Check with your health care provider or pharmacist before using salt substitutes.  Eat lower-sodium products, often labeled as "lower sodium" or "no salt added."  Eat fresh foods.  Eat more vegetables, fruits, and low-fat dairy products.  Choose whole grains. Look for the word "whole" as the first word in the ingredient list.  Choose fish and skinless chicken or [place] more often than red meat. Limit fish, poultry, and meat to 6 oz (170 g) each day.  Limit sweets, desserts, sugars, and sugary drinks.  Choose heart-healthy fats.  Limit cheese to 1 oz (28 g) per day.  Eat more home-cooked food and less restaurant, buffet, and fast food.  Limit fried foods.  Cook foods using methods other than frying.  Limit canned vegetables. If you do use them, rinse them well to decrease the sodium.  When eating at a restaurant, ask that your food be prepared with less salt, or no salt if possible. WHAT  FOODS CAN I EAT? Seek help from a dietitian for individual calorie needs. Grains Whole grain or whole wheat bread. Brown rice. Whole grain or whole wheat pasta. Quinoa, bulgur, and whole grain cereals. Low-sodium cereals. Corn or whole wheat flour tortillas. Whole grain cornbread. Whole grain crackers. Low-sodium crackers. Vegetables Fresh or frozen vegetables (raw, steamed,  roasted, or grilled). Low-sodium or reduced-sodium tomato and vegetable juices. Low-sodium or reduced-sodium tomato sauce and paste. Low-sodium or reduced-sodium canned vegetables.  Fruits All fresh, canned (in natural juice), or frozen fruits. Meat and Other Protein Products Ground beef (85% or leaner), grass-fed beef, or beef trimmed of fat. Skinless chicken or [place]. Ground chicken or [place]. Pork trimmed of fat. All fish and seafood. Eggs. Dried beans, peas, or lentils. Unsalted nuts and seeds. Unsalted canned beans. Dairy Low-fat dairy products, such as skim or 1% milk, 2% or reduced-fat cheeses, low-fat ricotta or cottage cheese, or plain low-fat yogurt. Low-sodium or reduced-sodium cheeses. Fats and Oils Tub margarines without trans fats. Light or reduced-fat mayonnaise and salad dressings (reduced sodium). Avocado. Safflower, olive, or canola oils. Natural peanut or almond butter. Other Unsalted popcorn and pretzels. The items listed above may not be a complete list of recommended foods or beverages. Contact your dietitian for more options. WHAT FOODS ARE NOT RECOMMENDED? Grains White bread. White pasta. White rice. Refined cornbread. Bagels and croissants. Crackers that contain trans fat. Vegetables Creamed or fried vegetables. Vegetables in a cheese sauce. Regular canned vegetables. Regular canned tomato sauce and paste. Regular tomato and vegetable juices. Fruits Dried fruits. Canned fruit in light or heavy syrup. Fruit juice. Meat and Other Protein Products Fatty cuts of meat. Ribs, chicken wings, bacon, sausage, bologna, salami, chitterlings, fatback, hot dogs, bratwurst, and packaged luncheon meats. Salted nuts and seeds. Canned beans with salt. Dairy Whole or 2% milk, cream, half-and-half, and cream cheese. Whole-fat or sweetened yogurt. Full-fat cheeses or blue cheese. Nondairy creamers and whipped toppings. Processed cheese, cheese spreads, or cheese curds. Condiments Onion  and garlic salt, seasoned salt, table salt, and sea salt. Canned and packaged gravies. Worcestershire sauce. Tartar sauce. Barbecue sauce. Teriyaki sauce. Soy sauce, including reduced sodium. Steak sauce. Fish sauce. Oyster sauce. Cocktail sauce. Horseradish. Ketchup and mustard. Meat flavorings and tenderizers. Bouillon cubes. Hot sauce. Tabasco sauce. Marinades. Taco seasonings. Relishes. Fats and Oils Butter, stick margarine, lard, shortening, ghee, and bacon fat. Coconut, palm kernel, or palm oils. Regular salad dressings. Other Pickles and olives. Salted popcorn and pretzels. The items listed above may not be a complete list of foods and beverages to avoid. Contact your dietitian for more information. WHERE CAN I FIND MORE INFORMATION? National Heart, Lung, and Blood Institute: travelstabloid.com   This information is not intended to replace advice given to you by your health care provider. Make sure you discuss any questions you have with your health care provider.   Document Released: 12/10/2010 Document Revised: 01/11/2014 Document Reviewed: 10/25/2012 Elsevier Interactive Patient Education [employer].

## 2015-09-30 NOTE — Assessment & Plan Note (Signed)
Try DASH guidelines and weight loss 

## 2015-09-30 NOTE — Assessment & Plan Note (Signed)
Aim for 20-30 pounds of weight loss over the coming year

## 2015-09-30 NOTE — Assessment & Plan Note (Signed)
Foot exam by MD today; check labs today; encourage weight loss and healthy eating

## 2015-09-30 NOTE — Assessment & Plan Note (Signed)
Check lipids, avoid saturated fats 

## 2015-10-01 ENCOUNTER — Telehealth: Payer: Self-pay | Admitting: Family Medicine

## 2015-10-01 DIAGNOSIS — R972 Elevated prostate specific antigen [PSA]: Secondary | ICD-10-CM

## 2015-10-01 LAB — LIPID PANEL W/DIRECT LDL/HDL RATIO
Cholesterol: 231 mg/dL — ABNORMAL HIGH (ref 125–200)
Direct LDL: 164 mg/dL — ABNORMAL HIGH (ref <130)
HDL: 45 mg/dL (ref >=40)
LDL:HDL Ratio: 3.6 Ratio
Total Chol/HDL Ratio: 5.1 Ratio — ABNORMAL HIGH (ref <=5.0)
Triglycerides: 145 mg/dL (ref <150)

## 2015-10-01 LAB — HEMOGLOBIN A1C
Hgb A1c MFr Bld: 7.3 % — ABNORMAL HIGH (ref <5.7)
Mean Plasma Glucose: 163 mg/dL

## 2015-10-01 LAB — MICROALBUMIN / CREATININE URINE RATIO
Creatinine, Urine: 201 mg/dL (ref 20–370)
Microalb Creat Ratio: 5 mcg/mg creat (ref <30)
Microalb, Ur: 1 mg/dL

## 2015-10-01 NOTE — Telephone Encounter (Signed)
I called about labs Left message Discuss: elevated PSA, cholesterol, A1c, renal function

## 2015-10-02 MED ORDER — ATORVASTATIN CALCIUM 10 MG PO TABS: 10.0000 mg | ORAL_TABLET | Freq: Every day | ORAL | 0 refills | Status: DC

## 2015-10-02 MED ORDER — METFORMIN HCL 500 MG PO TABS: 500.0000 mg | ORAL_TABLET | Freq: Every day | ORAL | 0 refills | Status: DC

## 2015-10-02 NOTE — Telephone Encounter (Signed)
I called again, left msg, calling about his labs; asked him to please call me to discuss ------------------------- I tried another number, talked with him at length A1c is too high; dietary changes encouraged; start low-dose med LDL too high; he agrees to dietary changes plus low-dose statin (our compromise) Cr above normal; avoid NSAIDs Return in 3 months instead of 6 months; once numbers are controlled, then 6 month f/u appropriate; he agrees

## 2015-10-02 NOTE — Assessment & Plan Note (Signed)
Refer back to urologist

## 2015-10-08 ENCOUNTER — Ambulatory Visit: Payer: 59

## 2016-03-29 ENCOUNTER — Ambulatory Visit: Payer: 59 | Admitting: Family Medicine

## 2016-04-02 ENCOUNTER — Encounter: Payer: Self-pay | Admitting: Family Medicine

## 2016-04-02 ENCOUNTER — Ambulatory Visit (INDEPENDENT_AMBULATORY_CARE_PROVIDER_SITE_OTHER): Payer: 59 | Admitting: Family Medicine

## 2016-04-02 VITALS — BP 120/72 | HR 72 | Temp 97.7°F | Resp 18 | Ht 70.0 in | Wt 240.0 lb

## 2016-04-02 DIAGNOSIS — Z0000 Encounter for general adult medical examination without abnormal findings: Secondary | ICD-10-CM

## 2016-04-02 LAB — CBC WITH DIFFERENTIAL/PLATELET
Basophils Absolute: 79 cells/uL (ref 0–200)
Basophils Relative: 1 %
Eosinophils Absolute: 79 cells/uL (ref 15–500)
Eosinophils Relative: 1 %
HCT: 46.5 % (ref 38.5–50.0)
Hemoglobin: 15.7 g/dL (ref 13.2–17.1)
Lymphocytes Relative: 29 %
Lymphs Abs: 2291 cells/uL (ref 850–3900)
MCH: 27.5 pg (ref 27.0–33.0)
MCHC: 33.8 g/dL (ref 32.0–36.0)
MCV: 81.4 fL (ref 80.0–100.0)
MPV: 9.3 fL (ref 7.5–12.5)
Monocytes Absolute: 553 cells/uL (ref 200–950)
Monocytes Relative: 7 %
Neutro Abs: 4898 cells/uL (ref 1500–7800)
Neutrophils Relative %: 62 %
Platelets: 272 10*3/uL (ref 140–400)
RBC: 5.71 MIL/uL (ref 4.20–5.80)
RDW: 14.5 % (ref 11.0–15.0)
WBC: 7.9 10*3/uL (ref 3.8–10.8)

## 2016-04-02 LAB — COMPLETE METABOLIC PANEL WITH GFR
ALT: 22 U/L (ref 9–46)
AST: 33 U/L (ref 10–35)
Albumin: 4.3 g/dL (ref 3.6–5.1)
Alkaline Phosphatase: 46 U/L (ref 40–115)
BUN: 13 mg/dL (ref 7–25)
CO2: 27 mmol/L (ref 20–31)
Calcium: 9.7 mg/dL (ref 8.6–10.3)
Chloride: 102 mmol/L (ref 98–110)
Creat: 1.47 mg/dL — ABNORMAL HIGH (ref 0.70–1.25)
GFR, Est African American: 58 mL/min — ABNORMAL LOW (ref >=60)
GFR, Est Non African American: 50 mL/min — ABNORMAL LOW (ref >=60)
Glucose, Bld: 106 mg/dL — ABNORMAL HIGH (ref 65–99)
Potassium: 4.9 mmol/L (ref 3.5–5.3)
Sodium: 138 mmol/L (ref 135–146)
Total Bilirubin: 0.5 mg/dL (ref 0.2–1.2)
Total Protein: 7.2 g/dL (ref 6.1–8.1)

## 2016-04-02 LAB — LIPID PANEL
Cholesterol: 252 mg/dL — ABNORMAL HIGH (ref <200)
HDL: 55 mg/dL (ref >40)
LDL Cholesterol: 169 mg/dL — ABNORMAL HIGH (ref <100)
Total CHOL/HDL Ratio: 4.6 Ratio (ref <5.0)
Triglycerides: 141 mg/dL (ref <150)
VLDL: 28 mg/dL (ref <30)

## 2016-04-02 NOTE — Progress Notes (Signed)
Name: John Benson   MRN: [id number]    DOB: 1950/06/25   Date:04/02/2016       Progress Note  Subjective  Chief Complaint  Chief Complaint  Patient presents with  . Annual Exam    HPI  Pt. Presents for Complete Physical Exam.  He is overall doing well. Colonoscopy was completed at ages 69 and 66 y.o, he was advised to repeat at age 40. He is due for PSA and rectal exam. Last PSA in September 2017 was elevated and was referred to Urology but did not want to follow up on the prostate biopsy.   Past Medical History:  Diagnosis Date  . Diabetes mellitus without complication ([place])   . History of kidney stones   . Hyperlipidemia   . Hypertension   . Hypogonadism in male   . Obesity     Past Surgical History:  Procedure Laterality Date  . LITHOTRIPSY      Family History  Problem Relation Age of Onset  . Diabetes Mother   . Cancer Mother     breast  . Hypertension Father   . Dementia Father   . Diabetes Brother   . Kidney cancer Neg Hx   . Prostate cancer Neg Hx   . Pancreatic disease Brother     Social History   Social History  . Marital status: Married    Spouse name: N/A  . Number of children: N/A  . Years of education: N/A   Occupational History  . Not on file.   Social History Main Topics  . Smoking status: Never Smoker  . Smokeless tobacco: Never Used  . Alcohol use No  . Drug use: No  . Sexual activity: Not on file   Other Topics Concern  . Not on file   Social History Narrative  . No narrative on file     Current Outpatient Prescriptions:  .  aspirin (ASPIRIN ADULT LOW STRENGTH) 81 MG EC tablet, Take by mouth., Disp: , Rfl:  .  BAYER CONTOUR NEXT TEST test strip, TEST BLOOD SUGAR once daily or as directed by prescriber, Disp: , Rfl: 1 .  Multiple Vitamin (MULTIVITAMIN) tablet, Take 1 tablet by mouth daily., Disp: , Rfl:  .  atorvastatin (LIPITOR) 10 MG tablet, Take 1 tablet (10 mg total) by mouth at bedtime. For cholesterol (Patient not  taking: Reported on 04/02/2016), Disp: 90 tablet, Rfl: 0 .  metFORMIN (GLUCOPHAGE) 500 MG tablet, Take 1 tablet (500 mg total) by mouth daily with breakfast. To help control sugars (Patient not taking: Reported on 04/02/2016), Disp: 90 tablet, Rfl: 0  Allergies  Allergen Reactions  . Testosterone Itching and Rash     Review of Systems  Constitutional: Negative for chills, fever, malaise/fatigue and weight loss.  HENT: Negative for congestion, ear pain and sore throat.   Eyes: Negative for blurred vision and double vision.  Respiratory: Negative for cough and shortness of breath.   Cardiovascular: Negative for chest pain and leg swelling.  Gastrointestinal: Negative for abdominal pain, blood in stool, constipation, diarrhea, nausea and vomiting.  Genitourinary: Negative for dysuria, frequency (drinks a lot of coffee and water and goes to the bathroom proportionally) and hematuria.  Musculoskeletal: Negative for back pain and neck pain.  Skin: Negative for rash.  Neurological: Negative for dizziness and headaches.  Psychiatric/Behavioral: Negative for depression. The patient is not nervous/anxious and does not have insomnia.     Objective  Vitals:   04/02/16 1059  BP: 120/72  Pulse:  72  Resp: 18  Temp: 97.7 F (36.5 C)  TempSrc: Oral  SpO2: 99%  Weight: 240 lb (108.9 kg)  Height: 5\' 10"  (1.778 m)    Physical Exam  Constitutional: He is oriented to person, place, and time and well-developed, well-nourished, and in no distress.  HENT:  Head: Normocephalic and atraumatic.  Right Ear: External ear normal.  Left Ear: External ear normal.  Mouth/Throat: Oropharynx is clear and moist. No oropharyngeal exudate.  Eyes: Conjunctivae are normal. Pupils are equal, round, and reactive to light.  Neck: Normal range of motion. Neck supple. No thyromegaly present.  Cardiovascular: Normal rate, regular rhythm and normal heart sounds.   No murmur heard. Pulmonary/Chest: Effort normal and  breath sounds normal. He has no wheezes.  Abdominal: Soft. Bowel sounds are normal. There is no tenderness.  Genitourinary: Prostate normal. Rectal exam shows no external hemorrhoid and no internal hemorrhoid. Prostate is not enlarged and not tender.  Musculoskeletal: He exhibits edema (R>L trace pitting edema in lower extremities).  Neurological: He is alert and oriented to person, place, and time.  Psychiatric: Mood, memory, affect and judgment normal.  Nursing note and vitals reviewed.      Assessment & Plan  1. Annual physical exam Obtain age-appropriate laboratory screenings - CBC with Differential/Platelet - Lipid panel - COMPLETE METABOLIC PANEL WITH GFR - TSH - VITAMIN D 25 Hydroxy (Vit-D Deficiency, Fractures) - PSA   [facility] 04/02/2016 11:48 AM

## 2016-04-03 LAB — TSH: TSH: 0.78 mIU/L (ref 0.40–4.50)

## 2016-04-03 LAB — VITAMIN D 25 HYDROXY (VIT D DEFICIENCY, FRACTURES): Vit D, 25-Hydroxy: 16 ng/mL — ABNORMAL LOW (ref 30–100)

## 2016-04-03 LAB — PSA: PSA: 9.4 ng/mL — ABNORMAL HIGH (ref <=4.0)

## 2016-04-08 ENCOUNTER — Other Ambulatory Visit: Payer: Self-pay | Admitting: Family Medicine

## 2016-04-08 ENCOUNTER — Telehealth: Payer: Self-pay

## 2016-04-08 DIAGNOSIS — R972 Elevated prostate specific antigen [PSA]: Secondary | ICD-10-CM

## 2016-04-08 MED ORDER — VITAMIN D (ERGOCALCIFEROL) 1.25 MG (50000 UNIT) PO CAPS: 50000.0000 [IU] | ORAL_CAPSULE | ORAL | 0 refills | Status: DC

## 2016-04-08 NOTE — Telephone Encounter (Signed)
Patient has been notified of lab results and a prescription for vitamin D3 50,000 units take 1 capsule once a week x12 weeks has been sent to [facility]. [place] per Dr. Manuella Ghazi patient has been notified and verbalized understanding

## 2016-04-16 ENCOUNTER — Encounter: Payer: Self-pay | Admitting: Family Medicine

## 2016-04-16 ENCOUNTER — Ambulatory Visit (INDEPENDENT_AMBULATORY_CARE_PROVIDER_SITE_OTHER): Payer: 59 | Admitting: Family Medicine

## 2016-04-16 VITALS — BP 122/70 | HR 63 | Temp 97.6°F | Resp 16 | Ht 69.0 in | Wt 248.1 lb

## 2016-04-16 DIAGNOSIS — E7800 Pure hypercholesterolemia, unspecified: Secondary | ICD-10-CM | POA: Diagnosis not present

## 2016-04-16 DIAGNOSIS — E119 Type 2 diabetes mellitus without complications: Secondary | ICD-10-CM

## 2016-04-16 LAB — POCT GLYCOSYLATED HEMOGLOBIN (HGB A1C): Hemoglobin A1C: 7.2

## 2016-04-16 MED ORDER — ATORVASTATIN CALCIUM 20 MG PO TABS: 20.0000 mg | ORAL_TABLET | Freq: Every day | ORAL | 0 refills | Status: DC

## 2016-04-16 NOTE — Progress Notes (Signed)
Name: John Benson   MRN: [id number]    DOB: 1950/10/31   Date:04/16/2016       Progress Note  Subjective  Chief Complaint  Chief Complaint  Patient presents with  . Follow-up    2 wk    Diabetes  He presents for his follow-up diabetic visit. He has type 2 diabetes mellitus. His disease course has been stable. Pertinent negatives for diabetes include no chest pain, no fatigue, no foot paresthesias, no polydipsia and no polyuria. There are no diabetic complications. Current diabetic treatment includes diet. He is following a diabetic diet. Frequency home blood tests: pt. not checking his blood glucose at home. An ACE inhibitor/angiotensin II receptor blocker is not being taken.  Hyperlipidemia  This is a chronic problem. The problem is uncontrolled. Recent lipid tests were reviewed and are high. Pertinent negatives include no chest pain, leg pain, myalgias or shortness of breath. He is currently on no antihyperlipidemic treatment (He is not taking ATorvastatin, trying to control his cholesterol with diet alone.).      Past Medical History:  Diagnosis Date  . Diabetes mellitus without complication ([place])   . History of kidney stones   . Hyperlipidemia   . Hypertension   . Hypogonadism in male   . Obesity     Past Surgical History:  Procedure Laterality Date  . LITHOTRIPSY      Family History  Problem Relation Age of Onset  . Diabetes Mother   . Cancer Mother     breast  . Hypertension Father   . Dementia Father   . Diabetes Brother   . Pancreatic disease Brother   . Kidney cancer Neg Hx   . Prostate cancer Neg Hx     Social History   Social History  . Marital status: Married    Spouse name: N/A  . Number of children: N/A  . Years of education: N/A   Occupational History  . Not on file.   Social History Main Topics  . Smoking status: Never Smoker  . Smokeless tobacco: Never Used  . Alcohol use No  . Drug use: No  . Sexual activity: Not on file   Other  Topics Concern  . Not on file   Social History Narrative  . No narrative on file     Current Outpatient Prescriptions:  .  aspirin (ASPIRIN ADULT LOW STRENGTH) 81 MG EC tablet, Take by mouth., Disp: , Rfl:  .  Multiple Vitamin (MULTIVITAMIN) tablet, Take 1 tablet by mouth daily., Disp: , Rfl:  .  Vitamin D, Ergocalciferol, (DRISDOL) 50000 units CAPS capsule, Take 1 capsule (50,000 Units total) by mouth once a week. For 12 weeks, Disp: 12 capsule, Rfl: 0 .  atorvastatin (LIPITOR) 10 MG tablet, Take 1 tablet (10 mg total) by mouth at bedtime. For cholesterol (Patient not taking: Reported on 04/02/2016), Disp: 90 tablet, Rfl: 0 .  BAYER CONTOUR NEXT TEST test strip, TEST BLOOD SUGAR once daily or as directed by prescriber, Disp: , Rfl: 1 .  metFORMIN (GLUCOPHAGE) 500 MG tablet, Take 1 tablet (500 mg total) by mouth daily with breakfast. To help control sugars (Patient not taking: Reported on 04/02/2016), Disp: 90 tablet, Rfl: 0  Allergies  Allergen Reactions  . Testosterone Itching and Rash     Review of Systems  Constitutional: Negative for fatigue.  Respiratory: Negative for shortness of breath.   Cardiovascular: Negative for chest pain.  Musculoskeletal: Negative for myalgias.  Endo/Heme/Allergies: Negative for polydipsia.  Objective  Vitals:   04/16/16 0952  BP: 122/70  Pulse: 63  Resp: 16  Temp: 97.6 F (36.4 C)  TempSrc: Oral  SpO2: 98%  Weight: 248 lb 1.6 oz (112.5 kg)  Height: 5\' 9"  (1.753 m)    Physical Exam  Constitutional: He is oriented to person, place, and time and well-developed, well-nourished, and in no distress.  Cardiovascular: Normal rate, regular rhythm and normal heart sounds.   No murmur heard. Pulmonary/Chest: Effort normal and breath sounds normal. He has no wheezes.  Abdominal: Soft. Bowel sounds are normal. There is no tenderness.  Musculoskeletal: He exhibits no edema.  Neurological: He is alert and oriented to person, place, and time.   Skin: He is not diaphoretic.  Psychiatric: Mood, memory, affect and judgment normal.  Nursing note and vitals reviewed.       Assessment & Plan  1. Type 2 diabetes mellitus without complication, without long-term current use of insulin ([name]) Point-of-care A1c is 7.2%, well-controlled diabetes, he would like to avoid pharmacotherapy at this time, reassess in 3 months - POCT HgB A1C  2. Pure hypercholesterolemia Poorly-controlled total and LDL cholesterol, start on atorvastatin 20 mg at bedtime, reassess in 3 months - atorvastatin (LIPITOR) 20 MG tablet; Take 1 tablet (20 mg total) by mouth daily at 6 PM. For cholesterol  Dispense: 90 tablet; Refill: 0  John Benson [facility] 04/16/2016 10:53 AM

## 2016-05-03 ENCOUNTER — Other Ambulatory Visit: Payer: Self-pay

## 2016-05-03 MED ORDER — BAYER CONTOUR NEXT TEST VI STRP: ORAL_STRIP | 1 refills | Status: DC

## 2016-05-03 NOTE — Telephone Encounter (Signed)
Got a fax from this patient pharmacy requesting a refill on this patient's Contour Next Test Strips.  Refill request was sent to Dr. Okey Dupre A. Manuella Ghazi for approval and submission.

## 2016-07-22 ENCOUNTER — Encounter: Payer: Self-pay | Admitting: Family Medicine

## 2016-07-22 ENCOUNTER — Ambulatory Visit (INDEPENDENT_AMBULATORY_CARE_PROVIDER_SITE_OTHER): Payer: 59 | Admitting: Family Medicine

## 2016-07-22 VITALS — BP 118/74 | HR 62 | Temp 97.6°F | Resp 16 | Ht 69.0 in | Wt 216.7 lb

## 2016-07-22 DIAGNOSIS — E7800 Pure hypercholesterolemia, unspecified: Secondary | ICD-10-CM | POA: Diagnosis not present

## 2016-07-22 DIAGNOSIS — E559 Vitamin D deficiency, unspecified: Secondary | ICD-10-CM | POA: Insufficient documentation

## 2016-07-22 DIAGNOSIS — E119 Type 2 diabetes mellitus without complications: Secondary | ICD-10-CM

## 2016-07-22 LAB — GLUCOSE, POCT (MANUAL RESULT ENTRY): POC Glucose: 96 mg/dl (ref 70–99)

## 2016-07-22 LAB — POCT GLYCOSYLATED HEMOGLOBIN (HGB A1C): Hemoglobin A1C: 6.1

## 2016-07-22 MED ORDER — ATORVASTATIN CALCIUM 20 MG PO TABS: 20.0000 mg | ORAL_TABLET | Freq: Every day | ORAL | 0 refills | Status: DC

## 2016-07-22 NOTE — Progress Notes (Signed)
Name: John Benson   MRN: [id number]    DOB: September 25, 1950   Date:07/22/2016       Progress Note  Subjective  Chief Complaint  Chief Complaint  Patient presents with  . Follow-up    3 mo  . Diabetes  . Hyperlipidemia    Diabetes  He presents for his follow-up diabetic visit. He has type 2 diabetes mellitus. His disease course has been stable. There are no hypoglycemic associated symptoms. Pertinent negatives for hypoglycemia include no dizziness or headaches. Pertinent negatives for diabetes include no fatigue, no foot paresthesias, no polydipsia and no polyuria. Risk factors for coronary artery disease include dyslipidemia. Current diabetic treatment includes diet. His weight is stable. He is following a generally healthy diet. He participates in exercise daily. Frequency home blood tests: does not check his blood glucose. An ACE inhibitor/angiotensin II receptor blocker is not being taken.  Hyperlipidemia  This is a chronic problem. The problem is uncontrolled. Recent lipid tests were reviewed and are high. He is currently on no antihyperlipidemic treatment (stopped taking Atorvastatin wanted to control his cholesterol with diet).   Patient was also noted to be deficient in vitamin D, his last level was 16 ng per mL, he has finished taking 12 weeks of vitamin D 50,000 units, will recheck levels today   Past Medical History:  Diagnosis Date  . Diabetes mellitus without complication ([place])   . History of kidney stones   . Hyperlipidemia   . Hypertension   . Hypogonadism in male   . Obesity     Past Surgical History:  Procedure Laterality Date  . LITHOTRIPSY      Family History  Problem Relation Age of Onset  . Diabetes Mother   . Cancer Mother        breast  . Hypertension Father   . Dementia Father   . Diabetes Brother   . Pancreatic disease Brother   . Kidney cancer Neg Hx   . Prostate cancer Neg Hx     Social History   Social History  . Marital status: Married     Spouse name: N/A  . Number of children: N/A  . Years of education: N/A   Occupational History  . Not on file.   Social History Main Topics  . Smoking status: Never Smoker  . Smokeless tobacco: Never Used  . Alcohol use No  . Drug use: No  . Sexual activity: Not on file   Other Topics Concern  . Not on file   Social History Narrative  . No narrative on file     Current Outpatient Prescriptions:  .  aspirin (ASPIRIN ADULT LOW STRENGTH) 81 MG EC tablet, Take by mouth., Disp: , Rfl:  .  BAYER CONTOUR NEXT TEST test strip, Use as instructed to check BG TID, Disp: 100 each, Rfl: 1 .  Multiple Vitamin (MULTIVITAMIN) tablet, Take 1 tablet by mouth daily., Disp: , Rfl:  .  atorvastatin (LIPITOR) 20 MG tablet, Take 1 tablet (20 mg total) by mouth daily at 6 PM. For cholesterol (Patient not taking: Reported on 07/22/2016), Disp: 90 tablet, Rfl: 0 .  metFORMIN (GLUCOPHAGE) 500 MG tablet, Take 1 tablet (500 mg total) by mouth daily with breakfast. To help control sugars (Patient not taking: Reported on 04/02/2016), Disp: 90 tablet, Rfl: 0 .  Vitamin D, Ergocalciferol, (DRISDOL) 50000 units CAPS capsule, Take 1 capsule (50,000 Units total) by mouth once a week. For 12 weeks (Patient not taking: Reported on 07/22/2016), Disp:  12 capsule, Rfl: 0  Allergies  Allergen Reactions  . Testosterone Itching and Rash     Review of Systems  Constitutional: Negative for fatigue.  Neurological: Negative for dizziness and headaches.  Endo/Heme/Allergies: Negative for polydipsia.      Objective  Vitals:   07/22/16 0825  BP: 118/74  Pulse: 62  Resp: 16  Temp: 97.6 F (36.4 C)  TempSrc: Oral  SpO2: 98%  Weight: 216 lb 11.2 oz (98.3 kg)  Height: 5\' 9"  (1.753 m)    Physical Exam  Constitutional: He is oriented to person, place, and time and well-developed, well-nourished, and in no distress.  Cardiovascular: Normal rate, regular rhythm and normal heart sounds.   No murmur  heard. Pulmonary/Chest: Effort normal and breath sounds normal. He has no wheezes.  Abdominal: Soft. Bowel sounds are normal. There is no tenderness.  Musculoskeletal: He exhibits no edema.       Right ankle: He exhibits no swelling.       Left ankle: He exhibits no swelling.  Neurological: He is alert and oriented to person, place, and time.  Skin: He is not diaphoretic.  Psychiatric: Mood, memory, affect and judgment normal.  Nursing note and vitals reviewed.   Recent Results (from the past 2160 hour(s))  POCT Glucose (CBG)     Status: Normal   Collection Time: 07/22/16  8:35 AM  Result Value Ref Range   POC Glucose 96 70 - 99 mg/dl  POCT HgB A1C     Status: Abnormal   Collection Time: 07/22/16  8:42 AM  Result Value Ref Range   Hemoglobin A1C 6.1      Assessment & Plan  1. Type 2 diabetes mellitus without complication, without long-term current use of insulin ([name]) Point-of-care A1c 6.1%, diet-controlled diabetes, no longer taking metformin. Advised to continue on dietary and lifestyle changes, no pharmacotherapy indicated at this stage - POCT HgB A1C - POCT Glucose (CBG)  2. Pure hypercholesterolemia Advised to restart taking atorvastatin for primary prevention since he has diabetes, obtain FLP - Lipid panel - atorvastatin (LIPITOR) 20 MG tablet; Take 1 tablet (20 mg total) by mouth daily at 6 PM. For cholesterol  Dispense: 90 tablet; Refill: 0  3. Vitamin D deficiency  - VITAMIN D 25 Hydroxy (Vit-D Deficiency, Fractures)   Kaeden Depaz [facility] 07/22/2016 8:46 AM

## 2016-07-23 LAB — VITAMIN D 25 HYDROXY (VIT D DEFICIENCY, FRACTURES): Vit D, 25-Hydroxy: 33 ng/mL (ref 30–100)

## 2016-07-23 LAB — LIPID PANEL
Cholesterol: 206 mg/dL — ABNORMAL HIGH (ref <200)
HDL: 61 mg/dL (ref >40)
LDL Cholesterol: 133 mg/dL — ABNORMAL HIGH (ref <100)
Total CHOL/HDL Ratio: 3.4 Ratio (ref <5.0)
Triglycerides: 58 mg/dL (ref <150)
VLDL: 12 mg/dL (ref <30)

## 2016-10-22 ENCOUNTER — Encounter: Payer: Self-pay | Admitting: Family Medicine

## 2016-10-22 ENCOUNTER — Ambulatory Visit (INDEPENDENT_AMBULATORY_CARE_PROVIDER_SITE_OTHER): Payer: 59 | Admitting: Family Medicine

## 2016-10-22 VITALS — BP 118/64 | HR 60 | Temp 97.4°F | Resp 14 | Ht 69.0 in | Wt 216.3 lb

## 2016-10-22 DIAGNOSIS — E7800 Pure hypercholesterolemia, unspecified: Secondary | ICD-10-CM | POA: Diagnosis not present

## 2016-10-22 DIAGNOSIS — Z23 Encounter for immunization: Secondary | ICD-10-CM | POA: Diagnosis not present

## 2016-10-22 DIAGNOSIS — E119 Type 2 diabetes mellitus without complications: Secondary | ICD-10-CM

## 2016-10-22 LAB — LIPID PANEL
Cholesterol: 198 mg/dL (ref <200)
HDL: 70 mg/dL (ref >40)
LDL Cholesterol (Calc): 114 mg/dL (calc) — ABNORMAL HIGH
Non-HDL Cholesterol (Calc): 128 mg/dL (calc) (ref <130)
Total CHOL/HDL Ratio: 2.8 (calc) (ref <5.0)
Triglycerides: 58 mg/dL (ref <150)

## 2016-10-22 LAB — GLUCOSE, POCT (MANUAL RESULT ENTRY): POC Glucose: 90 mg/dl (ref 70–99)

## 2016-10-22 LAB — POCT GLYCOSYLATED HEMOGLOBIN (HGB A1C): Hemoglobin A1C: 5.4

## 2016-10-22 NOTE — Progress Notes (Signed)
Name: John Benson   MRN: [id number]    DOB: 1950-09-23   Date:10/22/2016       Progress Note  Subjective  Chief Complaint  Chief Complaint  Patient presents with  . Follow-up    3 months     Diabetes  He presents for his follow-up diabetic visit. He has type 2 diabetes mellitus. His disease course has been stable. Pertinent negatives for diabetes include no chest pain, no fatigue, no foot paresthesias, no polydipsia and no polyuria. There are no hypoglycemic complications. Pertinent negatives for diabetic complications include no CVA, heart disease or peripheral neuropathy. Current diabetic treatment includes diet. He is following a diabetic and generally healthy diet. He participates in exercise daily (1 hr of walking). Blood glucose monitoring compliance: does not check his blood glucose at home. An ACE inhibitor/angiotensin II receptor blocker is not being taken.  Hyperlipidemia  This is a recurrent problem. The problem is uncontrolled. Recent lipid tests were reviewed and are high. Pertinent negatives include no chest pain, leg pain, myalgias or shortness of breath. He is currently on no antihyperlipidemic treatment (has stopped taking Atorvastatin).     Past Medical History:  Diagnosis Date  . Diabetes mellitus without complication (John Benson)   . History of kidney stones   . Hyperlipidemia   . Hypertension   . Hypogonadism in male   . Obesity     Past Surgical History:  Procedure Laterality Date  . LITHOTRIPSY      Family History  Problem Relation Age of Onset  . Diabetes Mother   . Cancer Mother        breast  . Hypertension Father   . Dementia Father   . Diabetes Brother   . Pancreatic disease Brother   . Kidney cancer Neg Hx   . Prostate cancer Neg Hx     Social History   Social History  . Marital status: Married    Spouse name: N/A  . Number of children: N/A  . Years of education: N/A   Occupational History  . Not on file.   Social History Main Topics   . Smoking status: Never Smoker  . Smokeless tobacco: Never Used  . Alcohol use No  . Drug use: No  . Sexual activity: No   Other Topics Concern  . Not on file   Social History Narrative  . No narrative on file     Current Outpatient Prescriptions:  .  aspirin (ASPIRIN ADULT LOW STRENGTH) 81 MG EC tablet, Take by mouth., Disp: , Rfl:  .  Garlic 4481 MG CAPS, Take 1,200 capsules by mouth every morning., Disp: , Rfl:  .  Multiple Vitamin (MULTIVITAMIN) tablet, Take 1 tablet by mouth daily., Disp: , Rfl:  .  omega-3 acid ethyl esters (LOVAZA) 1 g capsule, Take 1 g by mouth daily., Disp: , Rfl:  .  atorvastatin (LIPITOR) 20 MG tablet, Take 1 tablet (20 mg total) by mouth daily at 6 PM. For cholesterol (Patient not taking: Reported on 10/22/2016), Disp: 90 tablet, Rfl: 0 .  BAYER CONTOUR NEXT TEST test strip, Use as instructed to check BG TID (Patient not taking: Reported on 10/22/2016), Disp: 100 each, Rfl: 1 .  metFORMIN (GLUCOPHAGE) 500 MG tablet, Take 1 tablet (500 mg total) by mouth daily with breakfast. To help control sugars (Patient not taking: Reported on 04/02/2016), Disp: 90 tablet, Rfl: 0 .  Vitamin D, Ergocalciferol, (DRISDOL) 50000 units CAPS capsule, Take 1 capsule (50,000 Units total) by mouth once  a week. For 12 weeks (Patient not taking: Reported on 07/22/2016), Disp: 12 capsule, Rfl: 0  Allergies  Allergen Reactions  . Testosterone Itching and Rash     Review of Systems  Constitutional: Negative for fatigue.  Respiratory: Negative for shortness of breath.   Cardiovascular: Negative for chest pain.  Musculoskeletal: Negative for myalgias.  Endo/Heme/Allergies: Negative for polydipsia.      Objective  Vitals:   10/22/16 0833  BP: 118/64  Pulse: 60  Resp: 14  Temp: (!) 97.4 F (36.3 C)  TempSrc: Oral  SpO2: 99%  Weight: 216 lb 4.8 oz (98.1 kg)  Height: 5\' 9"  (1.753 m)    Physical Exam  Constitutional: He is oriented to person, place, and time and  well-developed, well-nourished, and in no distress.  HENT:  Head: Normocephalic and atraumatic.  Cardiovascular: Normal rate, regular rhythm and normal heart sounds.   No murmur heard. Pulmonary/Chest: Effort normal and breath sounds normal. He has no wheezes.  Abdominal: Soft. Bowel sounds are normal. There is no tenderness.  Neurological: He is alert and oriented to person, place, and time.  Psychiatric: Mood, memory, affect and judgment normal.  Nursing note and vitals reviewed.      Recent Results (from the past 2160 hour(s))  POCT HgB A1C     Status: Normal   Collection Time: 10/22/16  8:37 AM  Result Value Ref Range   Hemoglobin A1C 5.4   POCT Glucose (CBG)     Status: Normal   Collection Time: 10/22/16  8:37 AM  Result Value Ref Range   POC Glucose 90 70 - 99 mg/dl     Assessment & Plan  1. Needs flu shot  - Flu Vaccine QUAD 6+ mos PF IM (Fluarix Quad PF)  2. Type 2 diabetes mellitus without complication, without long-term current use of insulin ([name]) Point-of-care A1c is 5.4%, diet controlled diabetes, advised to continue on present management - POCT HgB A1C - POCT Glucose (CBG) - Urine Microalbumin w/creat. ratio  3. Pure hypercholesterolemia Although he has stopped atorvastatin, it may be beneficial for primary prevention. We'll obtain FLP and start on statin again - Lipid panel   John Benson [facility] 10/22/2016 8:49 AM

## 2016-10-23 LAB — MICROALBUMIN / CREATININE URINE RATIO
Creatinine, Urine: 127 mg/dL (ref 20–320)
Microalb Creat Ratio: 5 mcg/mg creat (ref <30)
Microalb, Ur: 0.6 mg/dL

## 2016-10-27 ENCOUNTER — Other Ambulatory Visit: Payer: Self-pay

## 2016-10-27 DIAGNOSIS — E7800 Pure hypercholesterolemia, unspecified: Secondary | ICD-10-CM

## 2016-10-27 MED ORDER — ATORVASTATIN CALCIUM 40 MG PO TABS: ORAL_TABLET | ORAL | 0 refills | Status: DC

## 2016-11-01 NOTE — Progress Notes (Signed)
Patient has not called back in to the [facility] 

## 2017-01-25 ENCOUNTER — Encounter: Payer: Self-pay | Admitting: Family Medicine

## 2017-01-25 ENCOUNTER — Ambulatory Visit: Payer: 59 | Admitting: Family Medicine

## 2017-01-25 VITALS — BP 120/70 | HR 62 | Temp 97.5°F | Resp 16 | Ht 69.0 in | Wt 221.6 lb

## 2017-01-25 DIAGNOSIS — E559 Vitamin D deficiency, unspecified: Secondary | ICD-10-CM | POA: Diagnosis not present

## 2017-01-25 DIAGNOSIS — E119 Type 2 diabetes mellitus without complications: Secondary | ICD-10-CM

## 2017-01-25 DIAGNOSIS — E7800 Pure hypercholesterolemia, unspecified: Secondary | ICD-10-CM | POA: Diagnosis not present

## 2017-01-25 DIAGNOSIS — R972 Elevated prostate specific antigen [PSA]: Secondary | ICD-10-CM

## 2017-01-25 LAB — POCT GLYCOSYLATED HEMOGLOBIN (HGB A1C): Hemoglobin A1C: 6

## 2017-01-25 LAB — GLUCOSE, POCT (MANUAL RESULT ENTRY): POC Glucose: 103 mg/dl — AB (ref 70–99)

## 2017-01-25 NOTE — Progress Notes (Signed)
Name: John Benson   MRN: [id number]    DOB: July 21, 1950   Date:01/25/2017       Progress Note  Subjective  Chief Complaint  Chief Complaint  Patient presents with  . Diabetes    follow up  . Hyperlipidemia    HPI  Diabetes  He presents for his follow-up diabetic visit. He has type 2 diabetes mellitus. His disease course has been stable. Pertinent negatives for diabetes include no chest pain, no fatigue, no foot paresthesias, no polydipsia and no polyuria. There are no hypoglycemic complications. Pertinent negatives for diabetic complications include no CVA, heart disease or peripheral neuropathy; does have hyperlipidemia. Current diabetic treatment includes diet controlled only. He is following a diabetic and generally healthy diet - though he did slip a little over the holidays. He participates in exercise daily (1 hr of walking, and some calisthenics). Blood glucose monitoring compliance: does not check his blood glucose at home. Not on ACEI or ARB.  A1C today is 6.0% - up from 5.4% at last visit - still well within goal.  Hyperlipidemia  This is a recurrent problem. The problem is uncontrolled. Recent lipid tests were reviewed and are high. Pertinent negatives include no chest pain, leg pain, myalgias or shortness of breath. He is currently on no antihyperlipidemic treatment - has stopped taking Atorvastatin and wanted to work on controlling this with his diet. He is taking OTC Omega 3 supplement and 81mg  ASA daily. We will recheck today.  Elevated PSA: He has history of elevated PSA - his last visit to urlogy he canceled because he felt that it had to do with his diet of eating too many peanuts.  John Benson had recommended in 2016 that they move to do a prostate biopsy if PSA remained elevated.  Pt would like to recheck PSA with our office, if still elevated, he is willing to see urology again.  Vitamin D Deficiency: Pt has hx vitamin D deficiency - last check was 62 in July 2018, but  this was just after he had completed a several week course of 50,000IU.  He has not been taking OTC daily supplementation.  Patient Active Problem List   Diagnosis Date Noted  . Vitamin D deficiency 07/22/2016  . Carpal tunnel syndrome 07/15/2014  . 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 HLD (hyperlipidemia) 07/15/2014  . Male hypogonadism 07/15/2014  . Obesity, Class II, BMI 35-39.9 07/15/2014  . Spermatocele 07/15/2014  . Type 2 diabetes mellitus without complication ([place]) 82/50/5397  . PSA elevation 06/24/2014  . Calculus of kidney 10/08/2012  . Decreased libido 05/22/2008    Past Surgical History:  Procedure Laterality Date  . LITHOTRIPSY      Family History  Problem Relation Age of Onset  . Diabetes Mother   . Cancer Mother        breast  . Hypertension Father   . Dementia Father   . Diabetes Brother   . Pancreatic disease Brother   . Kidney cancer Neg Hx   . Prostate cancer Neg Hx     Social History   Socioeconomic History  . Marital status: Married    Spouse name: Not on file  . Number of children: Not on file  . Years of education: Not on file  . Highest education level: Not on file  Social Needs  . Financial resource strain: Not on file  . Food insecurity - worry: Not on file  . Food insecurity - inability: Not on file  . Transportation needs -  medical: Not on file  . Transportation needs - non-medical: Not on file  Occupational History  . Not on file  Tobacco Use  . Smoking status: Never Smoker  . Smokeless tobacco: Never Used  Substance and Sexual Activity  . Alcohol use: No    Alcohol/week: 0.0 oz  . Drug use: No  . Sexual activity: No  Other Topics Concern  . Not on file  Social History Narrative  . Not on file     Current Outpatient Medications:  .  aspirin (ASPIRIN ADULT LOW STRENGTH) 81 MG EC tablet, Take by mouth., Disp: , Rfl:  .  atorvastatin (LIPITOR) 40 MG tablet, Take 1 tablet ( 40 mg total ) by mouth daily at 6pm., Disp: 90  tablet, Rfl: 0 .  BAYER CONTOUR NEXT TEST test strip, Use as instructed to check BG TID, Disp: 100 each, Rfl: 1 .  Garlic 2993 MG CAPS, Take 1,200 capsules by mouth every morning., Disp: , Rfl:  .  Multiple Vitamin (MULTIVITAMIN) tablet, Take 1 tablet by mouth daily., Disp: , Rfl:  .  omega-3 acid ethyl esters (LOVAZA) 1 g capsule, Take 1 g by mouth daily., Disp: , Rfl:   Allergies  Allergen Reactions  . Testosterone Itching and Rash    ROS Constitutional: Negative for fever or weight change.  Respiratory: Negative for cough and shortness of breath.   Cardiovascular: Negative for chest pain or palpitations.  Gastrointestinal: Negative for abdominal pain, no bowel changes.  Musculoskeletal: Negative for gait problem or joint swelling.  Skin: Negative for rash.  Neurological: Negative for dizziness or headache.  No other specific complaints in a complete review of systems (except as listed in HPI above).  Objective  Vitals:   01/25/17 0836  BP: 120/70  Pulse: 62  Resp: 16  Temp: (!) 97.5 F (36.4 C)  TempSrc: Oral  SpO2: 97%  Weight: 221 lb 9.6 oz (100.5 kg)  Height: 5\' 9"  (1.753 m)   Body mass index is 32.72 kg/m.  Physical Exam Constitutional: Patient appears well-developed and well-nourished. No distress.  HENT: Head: Normocephalic and atraumatic. Neck: Normal range of motion. Neck supple. No JVD present. No thyromegaly present.  Cardiovascular: Normal rate, regular rhythm and normal heart sounds.  No murmur heard. No BLE edema. Pulmonary/Chest: Effort normal and breath sounds normal. No respiratory distress. Neurological: he is alert and oriented to person, place, and time. No cranial nerve deficit. Coordination, balance, strength, speech and gait are normal.  Skin: Skin is warm and dry. No rash noted. No erythema.  Psychiatric: Patient has a normal mood and affect. behavior is normal. Judgment and thought content normal.  Results for orders placed or performed in  visit on 01/25/17 (from the past 72 hour(s))  POCT Glucose (CBG)     Status: Abnormal   Collection Time: 01/25/17  8:39 AM  Result Value Ref Range   POC Glucose 103 (A) 70 - 99 mg/dl  POCT HgB A1C     Status: None   Collection Time: 01/25/17  8:40 AM  Result Value Ref Range   Hemoglobin A1C 6.0    PHQ2/9: Depression screen [facility] 2/9 10/22/2016 07/22/2016 04/16/2016 09/30/2015 06/30/2015  Decreased Interest 0 0 0 0 0  Down, Depressed, Hopeless 0 0 0 0 0  PHQ - 2 Score 0 0 0 0 0   Fall Risk: Fall Risk  10/22/2016 07/22/2016 04/16/2016 09/30/2015 06/30/2015  Falls in the past year? No No No Yes No  Number falls in past yr: - - -  1 -  Injury with Fall? - - - No -   Assessment & Plan  1. Type 2 diabetes mellitus without complication, without long-term current use of insulin ([name]) - POCT Glucose (CBG) - POCT HgB A1C - COMPLETE METABOLIC PANEL WITH GFR - Diet controlled, stable.  2. Pure hypercholesterolemia - Lipid panel  3. PSA elevation - PSA - we will check today, if still elevated he is willing to see urology again.  4. Vitamin D deficiency - VITAMIN D 25 Hydroxy (Vit-D Deficiency, Fractures)

## 2017-01-26 ENCOUNTER — Other Ambulatory Visit: Payer: Self-pay | Admitting: Family Medicine

## 2017-01-26 DIAGNOSIS — R972 Elevated prostate specific antigen [PSA]: Secondary | ICD-10-CM

## 2017-01-26 DIAGNOSIS — E7800 Pure hypercholesterolemia, unspecified: Secondary | ICD-10-CM

## 2017-01-26 LAB — LIPID PANEL
Cholesterol: 193 mg/dL (ref <200)
HDL: 76 mg/dL (ref >40)
LDL Cholesterol (Calc): 101 mg/dL (calc) — ABNORMAL HIGH
Non-HDL Cholesterol (Calc): 117 mg/dL (calc) (ref <130)
Total CHOL/HDL Ratio: 2.5 (calc) (ref <5.0)
Triglycerides: 74 mg/dL (ref <150)

## 2017-01-26 LAB — VITAMIN D 25 HYDROXY (VIT D DEFICIENCY, FRACTURES): Vit D, 25-Hydroxy: 32 ng/mL (ref 30–100)

## 2017-01-26 LAB — COMPLETE METABOLIC PANEL WITH GFR
AG Ratio: 1.6 (calc) (ref 1.0–2.5)
ALT: 20 U/L (ref 9–46)
AST: 24 U/L (ref 10–35)
Albumin: 4.1 g/dL (ref 3.6–5.1)
Alkaline phosphatase (APISO): 44 U/L (ref 40–115)
BUN/Creatinine Ratio: 13 (calc) (ref 6–22)
BUN: 18 mg/dL (ref 7–25)
CO2: 29 mmol/L (ref 20–32)
Calcium: 9.7 mg/dL (ref 8.6–10.3)
Chloride: 105 mmol/L (ref 98–110)
Creat: 1.34 mg/dL — ABNORMAL HIGH (ref 0.70–1.25)
GFR, Est African American: 64 mL/min/{1.73_m2} (ref >=60)
GFR, Est Non African American: 56 mL/min/{1.73_m2} — ABNORMAL LOW (ref >=60)
Globulin: 2.5 g/dL (calc) (ref 1.9–3.7)
Glucose, Bld: 106 mg/dL — ABNORMAL HIGH (ref 65–99)
Potassium: 5.1 mmol/L (ref 3.5–5.3)
Sodium: 140 mmol/L (ref 135–146)
Total Bilirubin: 0.5 mg/dL (ref 0.2–1.2)
Total Protein: 6.6 g/dL (ref 6.1–8.1)

## 2017-01-26 LAB — PSA: PSA: 14.6 ng/mL — ABNORMAL HIGH (ref <=4.0)

## 2017-01-26 MED ORDER — ATORVASTATIN CALCIUM 20 MG PO TABS: 20.0000 mg | ORAL_TABLET | Freq: Every day | ORAL | 0 refills | Status: DC

## 2017-02-17 ENCOUNTER — Encounter: Payer: Self-pay | Admitting: Urology

## 2017-02-17 ENCOUNTER — Ambulatory Visit: Payer: 59 | Admitting: Urology

## 2017-02-21 ENCOUNTER — Telehealth: Payer: Self-pay | Admitting: Family Medicine

## 2017-02-21 NOTE — Telephone Encounter (Signed)
Patient refused Urological appointment. He stated he do not want to go. He stated he explained to them that he had researched his options from previous appointment and he do not want anything done. Per patient, please do not make any more Urological appointments. He is doing well.

## 2017-02-21 NOTE — Telephone Encounter (Signed)
Received a message from [place] at [facility] that pt no-showed again for his appointment - please call and ask how he is doing and how he is feeling about going to see the urologist. I strongly recommend he go as I am concerned that he could possibly have prostate cancer due to the continued elevation/worsening elevation of his PSA levels.  If he feels strongly that he does not want to go, or if he would like a referral to a different urology office, please let me know so that we can work together to get him the care that he needs.

## 2017-02-22 NOTE — Telephone Encounter (Signed)
Documentation reviewed 

## 2017-04-13 ENCOUNTER — Ambulatory Visit (INDEPENDENT_AMBULATORY_CARE_PROVIDER_SITE_OTHER): Payer: 59 | Admitting: Family Medicine

## 2017-04-13 ENCOUNTER — Encounter: Payer: Self-pay | Admitting: Family Medicine

## 2017-04-13 DIAGNOSIS — Z0000 Encounter for general adult medical examination without abnormal findings: Secondary | ICD-10-CM | POA: Diagnosis not present

## 2017-04-13 NOTE — Patient Instructions (Addendum)
Check out the information at familydoctor.org entitled "Nutrition for Weight Loss: What You Need to Know about Fad Diets" Try to lose between 1-2 pounds per week by taking in fewer calories and burning off more calories You can succeed by limiting portions, limiting foods dense in calories and fat, becoming more active, and drinking 8 glasses of water a day (64 ounces) Don't skip meals, especially breakfast, as skipping meals may alter your metabolism Do not use over-the-counter weight loss pills or gimmicks that claim rapid weight loss A healthy BMI (or body mass index) is between 18.5 and 24.9 You can calculate your ideal BMI at the [place] website ClubMonetize.fr A weight of 171 pounds gets you into the "normal" BMI range 205 pounds is in the overweight category Please do consider seeing the urologist, as you may have prostate cancer; consider if you would want to know and if you would want treatment; at the very least you could see the doctor and discuss your concerns and what options are available   Health Maintenance, Male A healthy lifestyle and preventive care is important for your health and wellness. Ask your health care provider about what schedule of regular examinations is right for you. What should I know about weight and diet? Eat a Healthy Diet  Eat plenty of vegetables, fruits, whole grains, low-fat dairy products, and lean protein.  Do not eat a lot of foods high in solid fats, added sugars, or salt.  Maintain a Healthy Weight Regular exercise can help you achieve or maintain a healthy weight. You should:  Do at least 150 minutes of exercise each week. The exercise should increase your heart rate and make you sweat (moderate-intensity exercise).  Do strength-training exercises at least twice a week.  Watch Your Levels of Cholesterol and Blood Lipids  Have your blood tested for lipids and cholesterol every 5 years starting  at 65 years of age. If you are at high risk for heart disease, you should start having your blood tested when you are 65 years old. You may need to have your cholesterol levels checked more often if: ? Your lipid or cholesterol levels are high. ? You are older than 65 years of age. ? You are at high risk for heart disease.  What should I know about cancer screening? Many types of cancers can be detected early and may often be prevented. Lung Cancer  You should be screened every year for lung cancer if: ? You are a current smoker who has smoked for at least 30 years. ? You are a former smoker who has quit within the past 15 years.  Talk to your health care provider about your screening options, when you should start screening, and how often you should be screened.  Colorectal Cancer  Routine colorectal cancer screening usually begins at 65 years of age and should be repeated every 5-10 years until you are 65 years old. You may need to be screened more often if early forms of precancerous polyps or small growths are found. Your health care provider may recommend screening at an earlier age if you have risk factors for colon cancer.  Your health care provider may recommend using home test kits to check for hidden blood in the stool.  A small camera at the end of a tube can be used to examine your colon (sigmoidoscopy or colonoscopy). This checks for the earliest forms of colorectal cancer.  Prostate and Testicular Cancer  Depending on your age and overall health, your  health care provider may do certain tests to screen for prostate and testicular cancer.  Talk to your health care provider about any symptoms or concerns you have about testicular or prostate cancer.  Skin Cancer  Check your skin from head to toe regularly.  Tell your health care provider about any new moles or changes in moles, especially if: ? There is a change in a mole's size, shape, or color. ? You have a mole that  is larger than a pencil eraser.  Always use sunscreen. Apply sunscreen liberally and repeat throughout the day.  Protect yourself by wearing long sleeves, pants, a wide-brimmed hat, and sunglasses when outside.  What should I know about heart disease, diabetes, and high blood pressure?  If you are 66-24 years of age, have your blood pressure checked every 3-5 years. If you are 59 years of age or older, have your blood pressure checked every year. You should have your blood pressure measured twice-once when you are at a hospital or clinic, and once when you are not at a hospital or clinic. Record the average of the two measurements. To check your blood pressure when you are not at a hospital or clinic, you can use: ? An automated blood pressure machine at a pharmacy. ? A home blood pressure monitor.  Talk to your health care provider about your target blood pressure.  If you are between 61-69 years old, ask your health care provider if you should take aspirin to prevent heart disease.  Have regular diabetes screenings by checking your fasting blood sugar level. ? If you are at a normal weight and have a low risk for diabetes, have this test once every three years after the age of 36. ? If you are overweight and have a high risk for diabetes, consider being tested at a younger age or more often.  A one-time screening for abdominal aortic aneurysm (AAA) by ultrasound is recommended for men aged 37-75 years who are current or former smokers. What should I know about preventing infection? Hepatitis B If you have a higher risk for hepatitis B, you should be screened for this virus. Talk with your health care provider to find out if you are at risk for hepatitis B infection. Hepatitis C Blood testing is recommended for:  Everyone born from [birth year] through 1963.  Anyone with known risk factors for hepatitis C.  Sexually Transmitted Diseases (STDs)  You should be screened each year for STDs  including gonorrhea and chlamydia if: ? You are sexually active and are younger than 65 years of age. ? You are older than 65 years of age and your health care provider tells you that you are at risk for this type of infection. ? Your sexual activity has changed since you were last screened and you are at an increased risk for chlamydia or gonorrhea. Ask your health care provider if you are at risk.  Talk with your health care provider about whether you are at high risk of being infected with HIV. Your health care provider may recommend a prescription medicine to help prevent HIV infection.  What else can I do?  Schedule regular health, dental, and eye exams.  Stay current with your vaccines (immunizations).  Do not use any tobacco products, such as cigarettes, chewing tobacco, and e-cigarettes. If you need help quitting, ask your health care provider.  Limit alcohol intake to no more than 2 drinks per day. One drink equals 12 ounces of beer, 5 ounces of  wine, or 1 ounces of hard liquor.  Do not use street drugs.  Do not share needles.  Ask your health care provider for help if you need support or information about quitting drugs.  Tell your health care provider if you often feel depressed.  Tell your health care provider if you have ever been abused or do not feel safe at home. This information is not intended to replace advice given to you by your health care provider. Make sure you discuss any questions you have with your health care provider. Document Released: 06/19/2007 Document Revised: 08/20/2015 Document Reviewed: 09/24/2014 Elsevier Interactive Patient Education  [employer].

## 2017-04-13 NOTE — Progress Notes (Signed)
Patient ID: John Benson, male   DOB: March 08, 1950, 67 y.o.   MRN: [id number]   Subjective:   John Benson is a 67 y.o. male here for a complete physical exam  Interim issues since last visit: no issues; working on feet; no flu  USPSTF grade A and B recommendations Depression:  Depression screen [facility] 2/9 04/13/2017 10/22/2016 07/22/2016 04/16/2016 09/30/2015  Decreased Interest 0 0 0 0 0  Down, Depressed, Hopeless 0 0 0 0 0  PHQ - 2 Score 0 0 0 0 0   Hypertension: BP Readings from Last 3 Encounters:  04/13/17 118/80  01/25/17 120/70  10/22/16 118/64   Obesity: Wt Readings from Last 3 Encounters:  04/13/17 214 lb 14.4 oz (97.5 kg)  01/25/17 221 lb 9.6 oz (100.5 kg)  10/22/16 216 lb 4.8 oz (98.1 kg)   BMI Readings from Last 3 Encounters:  04/13/17 31.28 kg/m  01/25/17 32.72 kg/m  10/22/16 31.94 kg/m    Immunizations: UTD Skin cancer: nothing worrisome Lung cancer:  Never smoker Prostate cancer: he feels fine and has no issues; he has seen urology 3-4 times; he does not want prostate biopsy; even if he has prostate cancer, he would not want to treat; he does not want a referral back to the urologist; it was the one that was upstairs; referred to Dr. Louis Meckel in January but patient does not want to go back Lab Results  Component Value Date   PSA 14.6 (H) 01/25/2017   PSA 9.4 (H) 04/02/2016   PSA 7.9 (H) 09/30/2015   Colorectal cancer: no fam hx; next due in 2025 AAA: n/a Aspirin: taking daily Diet: cutting sodas and bread; eating better, one cheat day; just sea salt if needed Exercise: every morning, walking 1 hour on the treadmill; over a year continuously Alcohol: no Tobacco use: never HIV, hep B, hep C: not intersted STD testing and prevention (chl/gon/syphilis): not intersted Glucose: less than 3 months Glucose  Date Value Ref Range Status  09/20/2012 166 (H) 65 - 99 mg/dL Final   Glucose, Bld  Date Value Ref Range Status  01/25/2017 106 (H) 65 - 99 mg/dL  Final    Comment:    .            Fasting reference interval . For someone without known diabetes, a glucose value between 100 and 125 mg/dL is consistent with prediabetes and should be confirmed with a follow-up test. .   04/02/2016 106 (H) 65 - 99 mg/dL Final  09/30/2015 155 (H) 65 - 99 mg/dL Final   Lipids:  Lab Results  Component Value Date   CHOL 193 01/25/2017   CHOL 198 10/22/2016   CHOL 206 (H) 07/22/2016   Lab Results  Component Value Date   HDL 76 01/25/2017   HDL 70 10/22/2016   HDL 61 07/22/2016   Lab Results  Component Value Date   LDLCALC 101 (H) 01/25/2017   LDLCALC 114 (H) 10/22/2016   LDLCALC 133 (H) 07/22/2016   Lab Results  Component Value Date   TRIG 74 01/25/2017   TRIG 58 10/22/2016   TRIG 58 07/22/2016   Lab Results  Component Value Date   CHOLHDL 2.5 01/25/2017   CHOLHDL 2.8 10/22/2016   CHOLHDL 3.4 07/22/2016   Lab Results  Component Value Date   LDLDIRECT 164 (H) 09/30/2015     Past Medical History:  Diagnosis Date  . Diabetes mellitus without complication ([place])   . History of kidney stones   .  Hyperlipidemia   . Hypogonadism in male   . Obesity    Past Surgical History:  Procedure Laterality Date  . LITHOTRIPSY     Family History  Problem Relation Age of Onset  . Diabetes Mother   . Cancer Mother        breast  . Hypertension Father   . Dementia Father   . Diabetes Brother   . Pancreatic disease Brother   . Kidney cancer Neg Hx   . Prostate cancer Neg Hx    Social History   Tobacco Use  . Smoking status: Never Smoker  . Smokeless tobacco: Never Used  Substance Use Topics  . Alcohol use: No    Alcohol/week: 0.0 oz  . Drug use: No   Review of Systems  Objective:   Vitals:   04/13/17 0819  BP: 118/80  Pulse: 70  Temp: 97.7 F (36.5 C)  TempSrc: Oral  SpO2: 97%  Weight: 214 lb 14.4 oz (97.5 kg)  Height: 5' 9.5" (1.765 m)   Body mass index is 31.28 kg/m. Wt Readings from Last 3 Encounters:   04/13/17 214 lb 14.4 oz (97.5 kg)  01/25/17 221 lb 9.6 oz (100.5 kg)  10/22/16 216 lb 4.8 oz (98.1 kg)   Physical Exam  Constitutional: He appears well-developed and well-nourished. No distress.  HENT:  Head: Normocephalic and atraumatic.  Nose: Nose normal.  Mouth/Throat: Oropharynx is clear and moist.  Eyes: EOM are normal. No scleral icterus.  Neck: No JVD present. No thyromegaly present.  Cardiovascular: Normal rate, regular rhythm and normal heart sounds.  Pulmonary/Chest: Effort normal and breath sounds normal. No respiratory distress. He has no wheezes. He has no rales.  Abdominal: Soft. Bowel sounds are normal. He exhibits no distension. There is no tenderness. There is no guarding.  Musculoskeletal: Normal range of motion. He exhibits no edema.  Lymphadenopathy:    He has no cervical adenopathy.  Neurological: He is alert. He displays normal reflexes. He exhibits normal muscle tone. Coordination normal.  Skin: Skin is warm and dry. No rash noted. He is not diaphoretic. No erythema. No pallor.  Psychiatric: He has a normal mood and affect. His behavior is normal. Judgment and thought content normal.   Diabetic Foot Form - Detailed   Diabetic Foot Exam - detailed Diabetic Foot exam was performed with the following findings:  Yes 04/13/2017  9:35 AM  Visual Foot Exam completed.:  Yes  Pulse Foot Exam completed.:  Yes  Sensory Foot Exam Completed.:  Yes Semmes-Weinstein Monofilament Test        Assessment/Plan:   Problem List Items Addressed This Visit      Other   Preventative health care    USPSTF grade A and B recommendations reviewed with patient; age-appropriate recommendations, preventive care, screening tests, etc discussed and encouraged; healthy living encouraged; see AVS for patient education given to patient         No orders of the defined types were placed in this encounter.  No orders of the defined types were placed in this encounter.   Follow up  plan: Return in about 1 year (around 04/14/2018) for [facility] to [employer] [facility] Visit.  An After Visit Summary was printed and given to the patient.

## 2017-04-25 DIAGNOSIS — Z0000 Encounter for general adult medical examination without abnormal findings: Secondary | ICD-10-CM | POA: Insufficient documentation

## 2017-04-25 NOTE — Assessment & Plan Note (Signed)
USPSTF grade A and B recommendations reviewed with patient; age-appropriate recommendations, preventive care, screening tests, etc discussed and encouraged; healthy living encouraged; see AVS for patient education given to patient  

## 2017-04-27 ENCOUNTER — Encounter: Payer: Self-pay | Admitting: Family Medicine

## 2017-04-27 ENCOUNTER — Ambulatory Visit: Payer: 59 | Admitting: Family Medicine

## 2017-04-27 DIAGNOSIS — N183 Chronic kidney disease, stage 3 unspecified: Secondary | ICD-10-CM

## 2017-04-27 DIAGNOSIS — R972 Elevated prostate specific antigen [PSA]: Secondary | ICD-10-CM | POA: Diagnosis not present

## 2017-04-27 DIAGNOSIS — E669 Obesity, unspecified: Secondary | ICD-10-CM | POA: Diagnosis not present

## 2017-04-27 DIAGNOSIS — E7800 Pure hypercholesterolemia, unspecified: Secondary | ICD-10-CM

## 2017-04-27 DIAGNOSIS — B353 Tinea pedis: Secondary | ICD-10-CM | POA: Insufficient documentation

## 2017-04-27 DIAGNOSIS — E119 Type 2 diabetes mellitus without complications: Secondary | ICD-10-CM | POA: Diagnosis not present

## 2017-04-27 DIAGNOSIS — N182 Chronic kidney disease, stage 2 (mild): Secondary | ICD-10-CM | POA: Insufficient documentation

## 2017-04-27 LAB — BASIC METABOLIC PANEL
BUN/Creatinine Ratio: 14 (calc) (ref 6–22)
BUN: 20 mg/dL (ref 7–25)
CO2: 32 mmol/L (ref 20–32)
Calcium: 9.7 mg/dL (ref 8.6–10.3)
Chloride: 104 mmol/L (ref 98–110)
Creat: 1.44 mg/dL — ABNORMAL HIGH (ref 0.70–1.25)
Glucose, Bld: 118 mg/dL — ABNORMAL HIGH (ref 65–99)
Potassium: 4.9 mmol/L (ref 3.5–5.3)
Sodium: 140 mmol/L (ref 135–146)

## 2017-04-27 LAB — LIPID PANEL
Cholesterol: 226 mg/dL — ABNORMAL HIGH (ref <200)
HDL: 70 mg/dL (ref >40)
LDL Cholesterol (Calc): 138 mg/dL (calc) — ABNORMAL HIGH
Non-HDL Cholesterol (Calc): 156 mg/dL (calc) — ABNORMAL HIGH (ref <130)
Total CHOL/HDL Ratio: 3.2 (calc) (ref <5.0)
Triglycerides: 83 mg/dL (ref <150)

## 2017-04-27 LAB — PSA: PSA: 15.5 ng/mL — ABNORMAL HIGH (ref <=4.0)

## 2017-04-27 NOTE — Patient Instructions (Addendum)
Please do see your eye doctor in May Work on 10 pounds of weight loss Check out the information at [employer] entitled "Nutrition for Weight Loss: What You Need to Know about Fad Diets" Try to lose between 1-2 pounds per week by taking in fewer calories and burning off more calories You can succeed by limiting portions, limiting foods dense in calories and fat, becoming more active, and drinking 8 glasses of water a day (64 ounces) Don't skip meals, especially breakfast, as skipping meals may alter your metabolism Do not use over-the-counter weight loss pills or gimmicks that claim rapid weight loss A healthy BMI (or body mass index) is between 18.5 and 24.9 You can calculate your ideal BMI at the [place] website ClubMonetize.fr Try tolnaftate daily for about four weeks on the feet Try to limit saturated fats in your diet (bologna, hot dogs, barbeque, cheeseburgers, hamburgers, steak, bacon, sausage, cheese, etc.) and get more fresh fruits, vegetables, and whole grains

## 2017-04-27 NOTE — Assessment & Plan Note (Signed)
Recheck today; patient has not seen urologist by choice; he is aware this might be prostate cancer

## 2017-04-27 NOTE — Progress Notes (Signed)
BP 118/78   Pulse 68   Temp (!) 97.5 F (36.4 C) (Oral)   Resp 14   Ht 5' 9.5" (1.765 m)   Wt 215 lb 9.6 oz (97.8 kg)   SpO2 97%   BMI 31.38 kg/m    Subjective:    Patient ID: [name], male    DOB: 05-17-1950, 67 y.o.   MRN: [id number]  HPI: JEROME VIGLIONE is a 67 y.o. male  Chief Complaint  Patient presents with  . Follow-up    HPI Type 2 diabetes; not checking sugars with my blessing; no dry mouth and no blurred vision; eye exam May 2018; no problems with eyes; tiny cataract but that's normal per eye doctor; avoiding bread  Hypertension; controlled; not checking BP away from [place]; not using table salt any more; using sea salt; not eating salt like he used to only when he eats out; stays away from regular salt  Obesity; walk every morning on the treadmill, about an hour  High cholesterol; avoiding fried foods; using Nutrum like the oats in oatmeal; using that for over a month, almost two months  PSA is very high; he is waiting on that he says; he wants [place] to check it again; has not seen urologist; he is aware this might be prostate cancer; no weak stream, no dribbling; getting up at night only if drinking fluids late  Depression screen [facility] 2/9 04/27/2017 04/13/2017 10/22/2016 07/22/2016 04/16/2016  Decreased Interest 0 0 0 0 0  Down, Depressed, Hopeless 0 0 0 0 0  PHQ - 2 Score 0 0 0 0 0    Relevant past medical, surgical, family and social history reviewed Past Medical History:  Diagnosis Date  . Diabetes mellitus without complication ([place])   . History of kidney stones   . Hyperlipidemia   . Hypogonadism in male   . Obesity    Past Surgical History:  Procedure Laterality Date  . LITHOTRIPSY     Family History  Problem Relation Age of Onset  . Diabetes Mother   . Cancer Mother        breast  . Hypertension Father   . Dementia Father   . Diabetes Brother   . Pancreatic disease Brother   . Kidney cancer Neg Hx   . Prostate cancer Neg Hx    Social  History   Tobacco Use  . Smoking status: Never Smoker  . Smokeless tobacco: Never Used  Substance Use Topics  . Alcohol use: No    Alcohol/week: 0.0 oz  . Drug use: No    Interim medical history since last visit reviewed. Allergies and medications reviewed  Review of Systems Per HPI unless specifically indicated above     Objective:    BP 118/78   Pulse 68   Temp (!) 97.5 F (36.4 C) (Oral)   Resp 14   Ht 5' 9.5" (1.765 m)   Wt 215 lb 9.6 oz (97.8 kg)   SpO2 97%   BMI 31.38 kg/m   Wt Readings from Last 3 Encounters:  04/27/17 215 lb 9.6 oz (97.8 kg)  04/13/17 214 lb 14.4 oz (97.5 kg)  01/25/17 221 lb 9.6 oz (100.5 kg)    Physical Exam  Constitutional: He appears well-developed and well-nourished. No distress.  HENT:  Head: Normocephalic and atraumatic.  Right Ear: External ear normal.  Left Ear: External ear normal.  Nose: Nose normal.  Mouth/Throat: No oropharyngeal exudate.  Eyes: Conjunctivae and EOM are normal. No scleral  icterus.  Neck: Normal range of motion. No thyromegaly present.  Cardiovascular: Normal rate, regular rhythm and normal heart sounds. Exam reveals no gallop and no friction rub.  No murmur heard. Pulmonary/Chest: Effort normal and breath sounds normal. No respiratory distress. He has no wheezes. He has no rales.  Abdominal: Soft. Bowel sounds are normal. He exhibits no distension. There is no tenderness. There is no guarding.  Musculoskeletal: Normal range of motion. He exhibits no edema.  Lymphadenopathy:    He has no cervical adenopathy.  Neurological: He is alert. He has normal reflexes.  Skin: Skin is warm and dry. No rash noted. He is not diaphoretic.  Psychiatric: He has a normal mood and affect. His behavior is normal. Judgment and thought content normal.   Diabetic Foot Form - Detailed   Diabetic Foot Exam - detailed Diabetic Foot exam was performed with the following findings:  Yes 04/27/2017  9:15 AM  Visual Foot Exam  completed.:  Yes  Pulse Foot Exam completed.:  Yes  Right Dorsalis Pedis:  Present Left Dorsalis Pedis:  Present  Sensory Foot Exam Completed.:  Yes Semmes-Weinstein Monofilament Test R Site 1-Great Toe:  Pos L Site 1-Great Toe:  Pos        Results for orders placed or performed in visit on 01/25/17  COMPLETE METABOLIC PANEL WITH GFR  Result Value Ref Range   Glucose, Bld 106 (H) 65 - 99 mg/dL   BUN 18 7 - 25 mg/dL   Creat 1.34 (H) 0.70 - 1.25 mg/dL   GFR, Est Non African American 56 (L) > OR = 60 mL/min/1.57m2   GFR, Est African American 64 > OR = 60 mL/min/1.74m2   BUN/Creatinine Ratio 13 6 - 22 (calc)   Sodium 140 135 - 146 mmol/L   Potassium 5.1 3.5 - 5.3 mmol/L   Chloride 105 98 - 110 mmol/L   CO2 29 20 - 32 mmol/L   Calcium 9.7 8.6 - 10.3 mg/dL   Total Protein 6.6 6.1 - 8.1 g/dL   Albumin 4.1 3.6 - 5.1 g/dL   Globulin 2.5 1.9 - 3.7 g/dL (calc)   AG Ratio 1.6 1.0 - 2.5 (calc)   Total Bilirubin 0.5 0.2 - 1.2 mg/dL   Alkaline phosphatase (APISO) 44 40 - 115 U/L   AST 24 10 - 35 U/L   ALT 20 9 - 46 U/L  VITAMIN D 25 Hydroxy (Vit-D Deficiency, Fractures)  Result Value Ref Range   Vit D, 25-Hydroxy 32 30 - 100 ng/mL  PSA  Result Value Ref Range   PSA 14.6 (H) < OR = 4.0 ng/mL  Lipid panel  Result Value Ref Range   Cholesterol 193 <200 mg/dL   HDL 76 >40 mg/dL   Triglycerides 74 <150 mg/dL   LDL Cholesterol (Calc) 101 (H) mg/dL (calc)   Total CHOL/HDL Ratio 2.5 <5.0 (calc)   Non-HDL Cholesterol (Calc) 117 <130 mg/dL (calc)  POCT Glucose (CBG)  Result Value Ref Range   POC Glucose 103 (A) 70 - 99 mg/dl  POCT HgB A1C  Result Value Ref Range   Hemoglobin A1C 6.0       Assessment & Plan:   Problem List Items Addressed This Visit      Endocrine   Type 2 diabetes mellitus without complication ([place])    Recheck A1c today; foot exam done by MD; eye exam due in May      Relevant Orders   Hemoglobin C5Y   Basic metabolic panel  Musculoskeletal and Integument     Tinea pedis    Try tolnaftate daily for abour 4 weeks        Genitourinary   Chronic kidney disease, stage III (moderate) ([name])    Check Cr        Other   PSA elevation    Recheck today; patient has not seen urologist by choice; he is aware this might be prostate cancer      Relevant Orders   PSA   Obesity, Class II, BMI 35-39.9    Working on weight loss, down 6 pounds since January      HLD (hyperlipidemia)    Limit foods from cows and pigs and saturated fats; will see how the OTC product has helped      Relevant Orders   Lipid panel       Follow up plan: Return in about 3 months (around 07/27/2017).  An after-visit summary was printed and given to the patient at [place].  Please see the patient instructions which may contain other information and recommendations beyond what is mentioned above in the assessment and plan.  No orders of the defined types were placed in this encounter.   Orders Placed This Encounter  Procedures  . Lipid panel  . Hemoglobin A1c  . Basic metabolic panel  . PSA

## 2017-04-27 NOTE — Assessment & Plan Note (Signed)
Limit foods from cows and pigs and saturated fats; will see how the OTC product has helped

## 2017-04-27 NOTE — Assessment & Plan Note (Signed)
Check Cr 

## 2017-04-27 NOTE — Assessment & Plan Note (Signed)
Working on weight loss, down 6 pounds since January

## 2017-04-27 NOTE — Assessment & Plan Note (Signed)
Try tolnaftate daily for abour 4 weeks

## 2017-04-27 NOTE — Assessment & Plan Note (Signed)
Recheck A1c today; foot exam done by MD; eye exam due in May

## 2017-04-28 ENCOUNTER — Other Ambulatory Visit: Payer: Self-pay | Admitting: Family Medicine

## 2017-04-28 DIAGNOSIS — R972 Elevated prostate specific antigen [PSA]: Secondary | ICD-10-CM

## 2017-04-28 LAB — HEMOGLOBIN A1C
Hgb A1c MFr Bld: 6.1 % of total Hgb — ABNORMAL HIGH (ref <5.7)
Mean Plasma Glucose: 128 (calc)
eAG (mmol/L): 7.1 (calc)

## 2017-04-28 NOTE — Progress Notes (Signed)
Referral entered for urologist

## 2017-07-27 ENCOUNTER — Encounter: Payer: Self-pay | Admitting: Family Medicine

## 2017-07-27 ENCOUNTER — Ambulatory Visit: Payer: 59 | Admitting: Family Medicine

## 2017-07-27 VITALS — BP 126/80 | HR 70 | Temp 97.8°F | Resp 16 | Ht 69.5 in | Wt 218.5 lb

## 2017-07-27 DIAGNOSIS — B353 Tinea pedis: Secondary | ICD-10-CM | POA: Diagnosis not present

## 2017-07-27 DIAGNOSIS — N182 Chronic kidney disease, stage 2 (mild): Secondary | ICD-10-CM

## 2017-07-27 DIAGNOSIS — E119 Type 2 diabetes mellitus without complications: Secondary | ICD-10-CM

## 2017-07-27 DIAGNOSIS — E669 Obesity, unspecified: Secondary | ICD-10-CM | POA: Insufficient documentation

## 2017-07-27 DIAGNOSIS — R972 Elevated prostate specific antigen [PSA]: Secondary | ICD-10-CM | POA: Diagnosis not present

## 2017-07-27 DIAGNOSIS — E66811 Obesity, class 1: Secondary | ICD-10-CM

## 2017-07-27 DIAGNOSIS — E7800 Pure hypercholesterolemia, unspecified: Secondary | ICD-10-CM | POA: Diagnosis not present

## 2017-07-27 NOTE — Assessment & Plan Note (Addendum)
Patient has seen urologists twice; not anxious to have a biopsy done; willing to check PSA again today; I discussed again today that this may be cancer; he does not want to go see urologist at this time to even discuss options or likelihood

## 2017-07-27 NOTE — Assessment & Plan Note (Addendum)
Limiting saturated fats; check lipids today; he's reluctant to start statin, trying diet first; I explained today that high LDL leads to plaque and heart attacks and strokes

## 2017-07-27 NOTE — Assessment & Plan Note (Signed)
Well-controlled; check A1c today

## 2017-07-27 NOTE — Assessment & Plan Note (Signed)
Try tolnaftate and vicks

## 2017-07-27 NOTE — Progress Notes (Signed)
BP 126/80 (BP Location: Right Arm, Patient Position: Sitting, Cuff Size: Normal)   Pulse 70   Temp 97.8 F (36.6 C) (Oral)   Resp 16   Ht 5' 9.5" (1.765 m)   Wt 218 lb 8 oz (99.1 kg)   SpO2 98%   BMI 31.80 kg/m    Subjective:    Patient ID: John Benson, male    DOB: 25-Jan-1950, 67 y.o.   MRN: [id number]  HPI: John Benson is a 67 y.o. male  Chief Complaint  Patient presents with  . Diabetes    HPI Patient is here for f/u Type 2 DM; watching diet; eye exam UTD, next due in November; no trouble seeing; athlete's foot; no frequent urination, just if drinking a lot of water before bed Lab Results  Component Value Date   HGBA1C 6.1 (H) 04/27/2017  on no medicines  High cholesterol; cut down on fatty meats since the last visit; trying to avoid sausage and bacon, just once a week; avoiding fried foods for the most part; not interested in cholesterol medicine, wants to see what it looks next time and not get labs today; yes, do get labs Lab Results  Component Value Date   CHOL 226 (H) 04/27/2017   HDL 70 04/27/2017   LDLCALC 138 (H) 04/27/2017   LDLDIRECT 164 (H) 09/30/2015   TRIG 83 04/27/2017   CHOLHDL 3.2 04/27/2017   Prostate issues; he is aware that this could be prostate cancer; does not want a biopsy; he has seen urologist already twice; does not want to address this right now   Obesity; has been doing more exercises and walking; every other day at the least, usually every single day; staying hydrated  Kidney stones; no recent stones; staying hydrated  Stage 2 CKD; staying hdyrated; avoiding NSAIDs  Depression screen [facility] 2/9 07/27/2017 04/27/2017 04/13/2017 10/22/2016 07/22/2016  Decreased Interest 0 0 0 0 0  Down, Depressed, Hopeless 0 0 0 0 0  PHQ - 2 Score 0 0 0 0 0    Relevant past medical, surgical, family and social history reviewed Past Medical History:  Diagnosis Date  . Diabetes mellitus without complication ([place])   . History of kidney stones     . Hyperlipidemia   . Hypogonadism in male   . Obesity    Past Surgical History:  Procedure Laterality Date  . LITHOTRIPSY     Family History  Problem Relation Age of Onset  . Diabetes Mother   . Cancer Mother        breast  . Hypertension Father   . Dementia Father   . Diabetes Brother   . Pancreatic disease Brother   . Kidney cancer Neg Hx   . Prostate cancer Neg Hx    Social History   Tobacco Use  . Smoking status: Never Smoker  . Smokeless tobacco: Never Used  Substance Use Topics  . Alcohol use: No    Alcohol/week: 0.0 oz  . Drug use: No    Interim medical history since last visit reviewed. Allergies and medications reviewed  Review of Systems Per HPI unless specifically indicated above     Objective:    BP 126/80 (BP Location: Right Arm, Patient Position: Sitting, Cuff Size: Normal)   Pulse 70   Temp 97.8 F (36.6 C) (Oral)   Resp 16   Ht 5' 9.5" (1.765 m)   Wt 218 lb 8 oz (99.1 kg)   SpO2 98%   BMI 31.80 kg/m  Wt Readings from Last 3 Encounters:  07/27/17 218 lb 8 oz (99.1 kg)  04/27/17 215 lb 9.6 oz (97.8 kg)  04/13/17 214 lb 14.4 oz (97.5 kg)    Physical Exam  Constitutional: He appears well-developed and well-nourished. No distress.  HENT:  Head: Normocephalic and atraumatic.  Eyes: EOM are normal. No scleral icterus.  Neck: No thyromegaly present.  Cardiovascular: Normal rate and regular rhythm.  Pulmonary/Chest: Effort normal and breath sounds normal.  Abdominal: Soft. Bowel sounds are normal. He exhibits no distension.  Musculoskeletal: He exhibits no edema.  Neurological: Coordination normal.  Skin: Skin is warm and dry. No pallor.  Scale on both feet, soles and lateral aspects; thickened great toenails  Psychiatric: He has a normal mood and affect. His behavior is normal. Judgment and thought content normal. His mood appears not anxious. He does not exhibit a depressed mood.   Diabetic Foot Form - Detailed   Diabetic Foot Exam -  detailed Diabetic Foot exam was performed with the following findings:  Yes 07/27/2017  9:02 AM  Visual Foot Exam completed.:  Yes  Pulse Foot Exam completed.:  Yes  Right Dorsalis Pedis:  Present Left Dorsalis Pedis:  Present  Sensory Foot Exam Completed.:  Yes Semmes-Weinstein Monofilament Test R Site 1-Great Toe:  Pos L Site 1-Great Toe:  Pos        Results for orders placed or performed in visit on 04/27/17  Lipid panel  Result Value Ref Range   Cholesterol 226 (H) <200 mg/dL   HDL 70 >40 mg/dL   Triglycerides 83 <150 mg/dL   LDL Cholesterol (Calc) 138 (H) mg/dL (calc)   Total CHOL/HDL Ratio 3.2 <5.0 (calc)   Non-HDL Cholesterol (Calc) 156 (H) <130 mg/dL (calc)  Hemoglobin A1c  Result Value Ref Range   Hgb A1c MFr Bld 6.1 (H) <5.7 % of total Hgb   Mean Plasma Glucose 128 (calc)   eAG (mmol/L) 7.1 (calc)  Basic metabolic panel  Result Value Ref Range   Glucose, Bld 118 (H) 65 - 99 mg/dL   BUN 20 7 - 25 mg/dL   Creat 1.44 (H) 0.70 - 1.25 mg/dL   BUN/Creatinine Ratio 14 6 - 22 (calc)   Sodium 140 135 - 146 mmol/L   Potassium 4.9 3.5 - 5.3 mmol/L   Chloride 104 98 - 110 mmol/L   CO2 32 20 - 32 mmol/L   Calcium 9.7 8.6 - 10.3 mg/dL  PSA  Result Value Ref Range   PSA 15.5 (H) < OR = 4.0 ng/mL      Assessment & Plan:   Problem List Items Addressed This Visit      Endocrine   Type 2 diabetes mellitus without complication ([place]) - Primary    Well-controlled; check A1c today      Relevant Orders   Hemoglobin A1c     Musculoskeletal and Integument   Tinea pedis    Try tolnaftate and vicks        Genitourinary   Chronic kidney disease, stage II (mild)    Stay hydrated, avoid NSAIDs        Other   PSA elevation    Patient has seen urologists twice; not anxious to have a biopsy done; willing to check PSA again today; I discussed again today that this may be cancer; he does not want to go see urologist at this time to even discuss options or likelihood       Relevant Orders   PSA   Obesity (  BMI 30.0-34.9)    Praise given for weight loss; working out; 205 pounds is next target      HLD (hyperlipidemia)    Limiting saturated fats; check lipids today; he's reluctant to start statin, trying diet first; I explained today that high LDL leads to plaque and heart attacks and strokes      Relevant Orders   Lipid panel       Follow up plan: Return in about 3 months (around 10/27/2017) for follow-up visit with Dr. Sanda Klein.  An after-visit summary was printed and given to the patient at [place].  Please see the patient instructions which may contain other information and recommendations beyond what is mentioned above in the assessment and plan.  No orders of the defined types were placed in this encounter.   Orders Placed This Encounter  Procedures  . Lipid panel  . PSA  . Hemoglobin A1c

## 2017-07-27 NOTE — Patient Instructions (Signed)
Check out the information at familydoctor.org entitled "Nutrition for Weight Loss: What You Need to Know about Fad Diets" Try to lose between 1-2 pounds per week by taking in fewer calories and burning off more calories You can succeed by limiting portions, limiting foods dense in calories and fat, becoming more active, and drinking 8 glasses of water a day (64 ounces) Don't skip meals, especially breakfast, as skipping meals may alter your metabolism Do not use over-the-counter weight loss pills or gimmicks that claim rapid weight loss A healthy BMI (or body mass index) is between 18.5 and 24.9 You can calculate your ideal BMI at the NIH website http://www.nhlbi.nih.gov/health/educational/lose_wt/BMI/bmicalc.htm  

## 2017-07-27 NOTE — Assessment & Plan Note (Addendum)
Praise given for weight loss; working out; 205 pounds is next target

## 2017-07-27 NOTE — Assessment & Plan Note (Signed)
Stay hydrated, avoid NSAIDs

## 2017-07-28 LAB — LIPID PANEL
Cholesterol: 260 mg/dL — ABNORMAL HIGH (ref <200)
HDL: 67 mg/dL (ref >40)
LDL Cholesterol (Calc): 172 mg/dL (calc) — ABNORMAL HIGH
Non-HDL Cholesterol (Calc): 193 mg/dL (calc) — ABNORMAL HIGH (ref <130)
Total CHOL/HDL Ratio: 3.9 (calc) (ref <5.0)
Triglycerides: 95 mg/dL (ref <150)

## 2017-07-28 LAB — HEMOGLOBIN A1C
Hgb A1c MFr Bld: 6.2 % of total Hgb — ABNORMAL HIGH (ref <5.7)
Mean Plasma Glucose: 131 (calc)
eAG (mmol/L): 7.3 (calc)

## 2017-07-28 LAB — PSA: PSA: 16.6 ng/mL — ABNORMAL HIGH (ref <=4.0)

## 2017-08-01 ENCOUNTER — Other Ambulatory Visit: Payer: Self-pay | Admitting: Family Medicine

## 2017-08-01 DIAGNOSIS — E7800 Pure hypercholesterolemia, unspecified: Secondary | ICD-10-CM

## 2017-08-01 MED ORDER — ATORVASTATIN CALCIUM 40 MG PO TABS: 40.0000 mg | ORAL_TABLET | Freq: Every day | ORAL | 0 refills | Status: DC

## 2017-08-01 NOTE — Progress Notes (Signed)
Start back on statin 

## 2017-10-27 ENCOUNTER — Ambulatory Visit (INDEPENDENT_AMBULATORY_CARE_PROVIDER_SITE_OTHER): Payer: 59 | Admitting: Family Medicine

## 2017-10-27 ENCOUNTER — Encounter: Payer: Self-pay | Admitting: Family Medicine

## 2017-10-27 VITALS — BP 120/74 | HR 64 | Temp 98.0°F | Ht 70.0 in | Wt 219.3 lb

## 2017-10-27 DIAGNOSIS — R972 Elevated prostate specific antigen [PSA]: Secondary | ICD-10-CM

## 2017-10-27 DIAGNOSIS — Z5181 Encounter for therapeutic drug level monitoring: Secondary | ICD-10-CM | POA: Diagnosis not present

## 2017-10-27 DIAGNOSIS — E119 Type 2 diabetes mellitus without complications: Secondary | ICD-10-CM

## 2017-10-27 DIAGNOSIS — E7800 Pure hypercholesterolemia, unspecified: Secondary | ICD-10-CM | POA: Diagnosis not present

## 2017-10-27 DIAGNOSIS — E669 Obesity, unspecified: Secondary | ICD-10-CM

## 2017-10-27 NOTE — Assessment & Plan Note (Signed)
Patient has not wanted to take medicine; declined statin; wants to see what today's numbers are first; more discussion after labs are back; limit saturated fats

## 2017-10-27 NOTE — Progress Notes (Signed)
BP 120/74   Pulse 64   Temp 98 F (36.7 C)   Ht 5\' 10"  (1.778 m)   Wt 219 lb 4.8 oz (99.5 kg)   SpO2 98%   BMI 31.47 kg/m    Subjective:    Patient ID: [name], male    DOB: 15-Jun-1950, 67 y.o.   MRN: [id number]  HPI: CREW GOREN is a 67 y.o. male  Chief Complaint  Patient presents with  . Follow-up    HPI Here for f/u Type 2 diabetes; dilutes the white mocha with just black coffee; cheat day on Sundays; no dry mouth or blurred; not checking sugars with my blessing; he cut the bread out; eye exam coming up next month  Lab Results  Component Value Date   HGBA1C 6.2 (H) 07/27/2017   High cholesterol; total and LDL up; he has been working on his diet; limiting meat skins; more conscious; trying to limit; not as much cheese as before, just in salads or pimento cheese; fewer eggsl no medicines, trying to do with diet and exercise; one hour a day most days, does the track or treadmill daily in the early morning; taking nutreum oat supplement to see if that helps and RYRl; limiting bacon and sausage; no bologna or hot dogs  Elevated PSA; patient still opts to just watch for now; opts for labs today   Depression screen [facility] 2/9 10/27/2017 07/27/2017 04/27/2017 04/13/2017 10/22/2016  Decreased Interest 0 0 0 0 0  Down, Depressed, Hopeless 0 0 0 0 0  PHQ - 2 Score 0 0 0 0 0  Altered sleeping 0 - - - -  Tired, decreased energy 0 - - - -  Change in appetite 0 - - - -  Feeling bad or failure about yourself  0 - - - -  Trouble concentrating 0 - - - -  Moving slowly or fidgety/restless 0 - - - -  Suicidal thoughts 0 - - - -  PHQ-9 Score 0 - - - -   Fall Risk  10/27/2017 07/27/2017 04/27/2017 04/13/2017 10/22/2016  Falls in the past year? No No No No No  Number falls in past yr: - - - - -  Injury with Fall? - - - - -    Relevant past medical, surgical, family and social history reviewed Past Medical History:  Diagnosis Date  . Diabetes mellitus without complication  ([place])   . History of kidney stones   . Hyperlipidemia   . Hypogonadism in male   . Obesity    Past Surgical History:  Procedure Laterality Date  . LITHOTRIPSY     Family History  Problem Relation Age of Onset  . Diabetes Mother   . Cancer Mother        breast  . Hypertension Father   . Dementia Father   . Diabetes Brother   . Pancreatic disease Brother   . Kidney cancer Neg Hx   . Prostate cancer Neg Hx    Social History   Tobacco Use  . Smoking status: Never Smoker  . Smokeless tobacco: Never Used  Substance Use Topics  . Alcohol use: No    Alcohol/week: 0.0 standard drinks  . Drug use: No     Office Visit from 10/27/2017 in [facility]  AUDIT-C Score  0      Interim medical history since last visit reviewed. Allergies and medications reviewed  Review of Systems Per HPI unless specifically indicated above  Objective:    BP 120/74   Pulse 64   Temp 98 F (36.7 C)   Ht 5\' 10"  (1.778 m)   Wt 219 lb 4.8 oz (99.5 kg)   SpO2 98%   BMI 31.47 kg/m   Wt Readings from Last 3 Encounters:  10/27/17 219 lb 4.8 oz (99.5 kg)  07/27/17 218 lb 8 oz (99.1 kg)  04/27/17 215 lb 9.6 oz (97.8 kg)    Physical Exam  Constitutional: He appears well-developed and well-nourished. No distress.  HENT:  Head: Normocephalic and atraumatic.  Eyes: EOM are normal. No scleral icterus.  Neck: No thyromegaly present.  Cardiovascular: Normal rate and regular rhythm.  Pulmonary/Chest: Effort normal and breath sounds normal.  Abdominal: Soft. Bowel sounds are normal. He exhibits no distension.  Musculoskeletal: He exhibits no edema.  Neurological: Coordination normal.  Skin: Skin is warm and dry. No pallor.  Psychiatric: He has a normal mood and affect. His behavior is normal. Judgment and thought content normal.   Diabetic Foot Form - Detailed   Diabetic Foot Exam - detailed Diabetic Foot exam was performed with the following findings:  Yes 10/27/2017   8:04 AM  Visual Foot Exam completed.:  Yes  Pulse Foot Exam completed.:  Yes  Right Dorsalis Pedis:  Present Left Dorsalis Pedis:  Present  Sensory Foot Exam Completed.:  Yes Semmes-Weinstein Monofilament Test R Site 1-Great Toe:  Pos L Site 1-Great Toe:  Pos        Results for orders placed or performed in visit on 07/27/17  Lipid panel  Result Value Ref Range   Cholesterol 260 (H) <200 mg/dL   HDL 67 >40 mg/dL   Triglycerides 95 <150 mg/dL   LDL Cholesterol (Calc) 172 (H) mg/dL (calc)   Total CHOL/HDL Ratio 3.9 <5.0 (calc)   Non-HDL Cholesterol (Calc) 193 (H) <130 mg/dL (calc)  PSA  Result Value Ref Range   PSA 16.6 (H) < OR = 4.0 ng/mL  Hemoglobin A1c  Result Value Ref Range   Hgb A1c MFr Bld 6.2 (H) <5.7 % of total Hgb   Mean Plasma Glucose 131 (calc)   eAG (mmol/L) 7.3 (calc)      Assessment & Plan:   Problem List Items Addressed This Visit      Endocrine   Type 2 diabetes mellitus without complication ([place]) - Primary    Foot exam by MD; check labs today; continue aspirin; BP is well-controlled      Relevant Orders   Hemoglobin A1c   Microalbumin / creatinine urine ratio     Other   PSA elevation    Patient is not interested at this time in seeing urologist; he does want to check the PSA again today; politely declined DRE      Relevant Orders   PSA   Obesity (BMI 30.0-34.9)    I think some of his weight is muscle from working out; 214.6 this morning but down from 227 pounds at one point; suggested he try to lose 8-10 pounds; praise given for exercise and better eating      HLD (hyperlipidemia)    Patient has not wanted to take medicine; declined statin; wants to see what today's numbers are first; more discussion after labs are back; limit saturated fats      Relevant Orders   Lipid panel    Other Visit Diagnoses    Medication monitoring encounter       Relevant Orders   COMPLETE METABOLIC PANEL WITH GFR  Follow up plan: Return in about 3  months (around 01/27/2018) for twenty minute follow-up with fasting labs.  An after-visit summary was printed and given to the patient at [place].  Please see the patient instructions which may contain other information and recommendations beyond what is mentioned above in the assessment and plan.  No orders of the defined types were placed in this encounter.   Orders Placed This Encounter  Procedures  . PSA  . COMPLETE METABOLIC PANEL WITH GFR  . Hemoglobin A1c  . Lipid panel  . Microalbumin / creatinine urine ratio

## 2017-10-27 NOTE — Assessment & Plan Note (Signed)
Foot exam by MD; check labs today; continue aspirin; BP is well-controlled

## 2017-10-27 NOTE — Assessment & Plan Note (Signed)
Patient is not interested at this time in seeing urologist; he does want to check the PSA again today; politely declined DRE

## 2017-10-27 NOTE — Assessment & Plan Note (Signed)
I think some of his weight is muscle from working out; 214.6 this morning but down from 227 pounds at one point; suggested he try to lose 8-10 pounds; praise given for exercise and better eating

## 2017-10-27 NOTE — Patient Instructions (Signed)
Please do see your eye doctor regularly, and have your eyes examined every year (or more often per his or her recommendation) Check your feet every night and let me know right away of any sores, infections, numbness, etc. Try to limit sweets, white bread, white rice, white potatoes It is okay with me for you to not check your fingerstick blood sugars (per [employer] of [employer]ocrinology Best Practices), unless you are interested and feel it would be helpful for you  Try to limit saturated fats in your diet (bologna, hot dogs, barbeque, cheeseburgers, hamburgers, steak, bacon, sausage, cheese, etc.) and get more fresh fruits, vegetables, and whole grains  Try to lose 8-10 pounds of body fat

## 2017-10-28 LAB — HEMOGLOBIN A1C
Hgb A1c MFr Bld: 6.4 % of total Hgb — ABNORMAL HIGH (ref <5.7)
Mean Plasma Glucose: 137 (calc)
eAG (mmol/L): 7.6 (calc)

## 2017-10-28 LAB — COMPLETE METABOLIC PANEL WITH GFR
AG Ratio: 1.7 (calc) (ref 1.0–2.5)
ALT: 23 U/L (ref 9–46)
AST: 28 U/L (ref 10–35)
Albumin: 4.5 g/dL (ref 3.6–5.1)
Alkaline phosphatase (APISO): 41 U/L (ref 40–115)
BUN/Creatinine Ratio: 13 (calc) (ref 6–22)
BUN: 18 mg/dL (ref 7–25)
CO2: 28 mmol/L (ref 20–32)
Calcium: 9.7 mg/dL (ref 8.6–10.3)
Chloride: 105 mmol/L (ref 98–110)
Creat: 1.42 mg/dL — ABNORMAL HIGH (ref 0.70–1.25)
GFR, Est African American: 60 mL/min/{1.73_m2} (ref >=60)
GFR, Est Non African American: 52 mL/min/{1.73_m2} — ABNORMAL LOW (ref >=60)
Globulin: 2.6 g/dL (calc) (ref 1.9–3.7)
Glucose, Bld: 107 mg/dL — ABNORMAL HIGH (ref 65–99)
Potassium: 4.7 mmol/L (ref 3.5–5.3)
Sodium: 141 mmol/L (ref 135–146)
Total Bilirubin: 0.5 mg/dL (ref 0.2–1.2)
Total Protein: 7.1 g/dL (ref 6.1–8.1)

## 2017-10-28 LAB — MICROALBUMIN / CREATININE URINE RATIO
Creatinine, Urine: 194 mg/dL (ref 20–320)
Microalb Creat Ratio: 6 mcg/mg creat (ref <30)
Microalb, Ur: 1.2 mg/dL

## 2017-10-28 LAB — LIPID PANEL
Cholesterol: 212 mg/dL — ABNORMAL HIGH (ref <200)
HDL: 59 mg/dL (ref >40)
LDL Cholesterol (Calc): 133 mg/dL (calc) — ABNORMAL HIGH
Non-HDL Cholesterol (Calc): 153 mg/dL (calc) — ABNORMAL HIGH (ref <130)
Total CHOL/HDL Ratio: 3.6 (calc) (ref <5.0)
Triglycerides: 102 mg/dL (ref <150)

## 2017-10-28 LAB — PSA: PSA: 19.3 ng/mL — ABNORMAL HIGH (ref <=4.0)

## 2017-11-03 ENCOUNTER — Other Ambulatory Visit: Payer: Self-pay | Admitting: Nurse Practitioner

## 2017-11-03 DIAGNOSIS — E7849 Other hyperlipidemia: Secondary | ICD-10-CM

## 2017-11-03 DIAGNOSIS — R972 Elevated prostate specific antigen [PSA]: Secondary | ICD-10-CM

## 2017-11-03 MED ORDER — ATORVASTATIN CALCIUM 10 MG PO TABS: 10.0000 mg | ORAL_TABLET | Freq: Every day | ORAL | 0 refills | Status: DC

## 2018-01-27 ENCOUNTER — Ambulatory Visit: Payer: 59 | Admitting: Family Medicine

## 2018-01-27 LAB — HM DIABETES EYE EXAM

## 2018-02-02 ENCOUNTER — Encounter: Payer: Self-pay | Admitting: Family Medicine

## 2018-02-02 ENCOUNTER — Ambulatory Visit (INDEPENDENT_AMBULATORY_CARE_PROVIDER_SITE_OTHER): Payer: PPO | Admitting: Family Medicine

## 2018-02-02 VITALS — BP 110/65 | HR 56 | Temp 97.4°F | Resp 18 | Ht 70.0 in | Wt 220.0 lb

## 2018-02-02 DIAGNOSIS — E7800 Pure hypercholesterolemia, unspecified: Secondary | ICD-10-CM | POA: Diagnosis not present

## 2018-02-02 DIAGNOSIS — E119 Type 2 diabetes mellitus without complications: Secondary | ICD-10-CM | POA: Diagnosis not present

## 2018-02-02 DIAGNOSIS — L602 Onychogryphosis: Secondary | ICD-10-CM | POA: Diagnosis not present

## 2018-02-02 DIAGNOSIS — R972 Elevated prostate specific antigen [PSA]: Secondary | ICD-10-CM

## 2018-02-02 DIAGNOSIS — E669 Obesity, unspecified: Secondary | ICD-10-CM

## 2018-02-02 NOTE — Progress Notes (Addendum)
BP 110/65   Pulse (!) 56   Temp (!) 97.4 F (36.3 C)   Resp 18   Ht 5\' 10"  (1.778 m)   Wt 220 lb (99.8 kg)   SpO2 98%   BMI 31.57 kg/m    Subjective:    Patient ID: John Benson, male    DOB: 24-Oct-1950, 68 y.o.   MRN: [id number]  HPI: John Benson is a 68 y.o. male  Chief Complaint  Patient presents with  . Follow-up    HPI Here for follow-up  Type 2 diabetes; avoiding sweets for the most part, just once a week; no problems with feet Did not splurge over the holidays Eye exam January 24th; eyes looked good Not necessary to check FSBS; no dry mouth; no nocturia; no new blurred vision, wears readers Urine microalb:Cr just done in October and normal  Lab Results  Component Value Date   HGBA1C 6.4 (H) 10/27/2017   High cholesterol; trying to eat nuts and fruits and veggies; trying to control diet; walking for an hour on the treadmill; eating red meat about 1x a week  Lab Results  Component Value Date   CHOL 212 (H) 10/27/2017   HDL 59 10/27/2017   LDLCALC 133 (H) 10/27/2017   LDLDIRECT 164 (H) 09/30/2015   TRIG 102 10/27/2017   CHOLHDL 3.6 10/27/2017   Elevated PSA; patient does not want to be evaluated, does not want to see urologist; I asked why; he says it's not bothering him right now and doesn't want to do anything, even if it's cancer  CKD stage 2; taking aspirin but no other NSAIDs; good water drinker  Depression screen [facility] 2/9 02/02/2018 10/27/2017 07/27/2017 04/27/2017 04/13/2017  Decreased Interest 0 0 0 0 0  Down, Depressed, Hopeless 0 0 0 0 0  PHQ - 2 Score 0 0 0 0 0  Altered sleeping 0 0 - - -  Tired, decreased energy 0 0 - - -  Change in appetite 0 0 - - -  Feeling bad or failure about yourself  0 0 - - -  Trouble concentrating 0 0 - - -  Moving slowly or fidgety/restless 0 0 - - -  Suicidal thoughts 0 0 - - -  PHQ-9 Score 0 0 - - -  Difficult doing work/chores Not difficult at all - - - -   Fall Risk  02/02/2018 10/27/2017 07/27/2017  04/27/2017 04/13/2017  Falls in the past year? 0 No No No No  Number falls in past yr: 0 - - - -  Injury with Fall? 0 - - - -    Relevant past medical, surgical, family and social history reviewed Past Medical History:  Diagnosis Date  . Diabetes mellitus without complication ([place])   . History of kidney stones   . Hyperlipidemia   . Hypogonadism in male   . Obesity    Past Surgical History:  Procedure Laterality Date  . LITHOTRIPSY     Family History  Problem Relation Age of Onset  . Diabetes Mother   . Cancer Mother        breast  . Hypertension Father   . Dementia Father   . Diabetes Brother   . Pancreatic disease Brother   . Kidney cancer Neg Hx   . Prostate cancer Neg Hx    Social History   Tobacco Use  . Smoking status: Never Smoker  . Smokeless tobacco: Never Used  Substance Use Topics  . Alcohol use: No  Alcohol/week: 0.0 standard drinks  . Drug use: No     Office Visit from 02/02/2018 in [facility]  AUDIT-C Score  0      Interim medical history since last visit reviewed. Allergies and medications reviewed  Review of Systems Per HPI unless specifically indicated above     Objective:    BP 110/65   Pulse (!) 56   Temp (!) 97.4 F (36.3 C)   Resp 18   Ht 5\' 10"  (1.778 m)   Wt 220 lb (99.8 kg)   SpO2 98%   BMI 31.57 kg/m   Wt Readings from Last 3 Encounters:  02/02/18 220 lb (99.8 kg)  10/27/17 219 lb 4.8 oz (99.5 kg)  07/27/17 218 lb 8 oz (99.1 kg)    Physical Exam Constitutional:      General: He is not in acute distress.    Appearance: He is well-developed. He is obese.  HENT:     Head: Normocephalic and atraumatic.  Eyes:     General: No scleral icterus. Neck:     Thyroid: No thyromegaly.  Cardiovascular:     Rate and Rhythm: Regular rhythm. Bradycardia present.  No extrasystoles are present. Pulmonary:     Effort: Pulmonary effort is normal.     Breath sounds: Normal breath sounds.  Abdominal:      General: Bowel sounds are normal. There is no distension.     Palpations: Abdomen is soft.  Skin:    General: Skin is warm and dry.     Coloration: Skin is not pale.  Neurological:     Mental Status: He is alert.     Coordination: Coordination normal.  Psychiatric:        Behavior: Behavior normal.        Thought Content: Thought content normal.        Judgment: Judgment normal.    Diabetic Foot Form - Detailed   Diabetic Foot Exam - detailed Diabetic Foot exam was performed with the following findings:  Yes 02/02/2018  8:47 AM  Visual Foot Exam completed.:  Yes  Pulse Foot Exam completed.:  Yes  Right Dorsalis Pedis:  Present Left Dorsalis Pedis:  Present  Sensory Foot Exam Completed.:  Yes Semmes-Weinstein Monofilament Test R Site 1-Great Toe:  Pos L Site 1-Great Toe:  Pos         Results for orders placed or performed in visit on 10/27/17  PSA  Result Value Ref Range   PSA 19.3 (H) < OR = 4.0 ng/mL  COMPLETE METABOLIC PANEL WITH GFR  Result Value Ref Range   Glucose, Bld 107 (H) 65 - 99 mg/dL   BUN 18 7 - 25 mg/dL   Creat 1.42 (H) 0.70 - 1.25 mg/dL   GFR, Est Non African American 52 (L) > OR = 60 mL/min/1.21m2   GFR, Est African American 60 > OR = 60 mL/min/1.70m2   BUN/Creatinine Ratio 13 6 - 22 (calc)   Sodium 141 135 - 146 mmol/L   Potassium 4.7 3.5 - 5.3 mmol/L   Chloride 105 98 - 110 mmol/L   CO2 28 20 - 32 mmol/L   Calcium 9.7 8.6 - 10.3 mg/dL   Total Protein 7.1 6.1 - 8.1 g/dL   Albumin 4.5 3.6 - 5.1 g/dL   Globulin 2.6 1.9 - 3.7 g/dL (calc)   AG Ratio 1.7 1.0 - 2.5 (calc)   Total Bilirubin 0.5 0.2 - 1.2 mg/dL   Alkaline phosphatase (APISO) 41 40 - 115  U/L   AST 28 10 - 35 U/L   ALT 23 9 - 46 U/L  Hemoglobin A1c  Result Value Ref Range   Hgb A1c MFr Bld 6.4 (H) <5.7 % of total Hgb   Mean Plasma Glucose 137 (calc)   eAG (mmol/L) 7.6 (calc)  Lipid panel  Result Value Ref Range   Cholesterol 212 (H) <200 mg/dL   HDL 59 >40 mg/dL   Triglycerides 102  <150 mg/dL   LDL Cholesterol (Calc) 133 (H) mg/dL (calc)   Total CHOL/HDL Ratio 3.6 <5.0 (calc)   Non-HDL Cholesterol (Calc) 153 (H) <130 mg/dL (calc)  Microalbumin / creatinine urine ratio  Result Value Ref Range   Creatinine, Urine 194 20 - 320 mg/dL   Microalb, Ur 1.2 mg/dL   Microalb Creat Ratio 6 <30 mcg/mg creat      Assessment & Plan:   Problem List Items Addressed This Visit      Endocrine   Type 2 diabetes mellitus without complication ([name]) - Primary (Chronic)    Foot exam by MD; eye exam UTD; so glad he is watching his diet; check labs      Relevant Orders   Hemoglobin J8I   BASIC METABOLIC PANEL WITH GFR   Ambulatory referral to Podiatry     Other   PSA elevation    Patient is aware that he may have cancer; he does not want to see urologist even after discussion about cancer and mets; honor his wishes; he declined DRE but does agree to see what his PSA is; I explained prostate cancer is the #2 leading cause of death in men from cancer      Relevant Orders   PSA   Obesity (BMI 30.0-34.9)    So proud of patient for getting on the treadmill and watching his diet; ongoing weight loss encouraged      HLD (hyperlipidemia)    Limiting saturated fats; check lipids; LDL goal less than 70; limit egg yolks to no more than 3 per week      Relevant Orders   Lipid panel    Other Visit Diagnoses    Thick nail       refer to podiatry   Relevant Orders   Ambulatory referral to Podiatry       Follow up plan: Return in about 6 months (around 08/03/2018) for follow-up visit with Dr. Sanda Klein; Welcome to Medicare visit soon.  An after-visit summary was printed and given to the patient at [place].  Please see the patient instructions which may contain other information and recommendations beyond what is mentioned above in the assessment and plan.  No orders of the defined types were placed in this encounter.   Orders Placed This Encounter  Procedures  . Hemoglobin A1C    . Lipid panel  . BASIC METABOLIC PANEL WITH GFR  . PSA  . Ambulatory referral to Podiatry

## 2018-02-02 NOTE — Assessment & Plan Note (Signed)
Foot exam by MD; eye exam UTD; so glad he is watching his diet; check labs

## 2018-02-02 NOTE — Patient Instructions (Addendum)
Check out the information at familydoctor.org entitled "Nutrition for Weight Loss: What You Need to Know about Fad Diets" Try to lose between 1-2 pounds per week by taking in fewer calories and burning off more calories You can succeed by limiting portions, limiting foods dense in calories and fat, becoming more active, and drinking 8 glasses of water a day (64 ounces) Don't skip meals, especially breakfast, as skipping meals may alter your metabolism Do not use over-the-counter weight loss pills or gimmicks that claim rapid weight loss A healthy BMI (or body mass index) is between 18.5 and 24.9 You can calculate your ideal BMI at the NIH website http://www.nhlbi.nih.gov/health/educational/lose_wt/BMI/bmicalc.htm  Obesity, Adult Obesity is the condition of having too much total body fat. Being overweight or obese means that your weight is greater than what is considered healthy for your body size. Obesity is determined by a measurement called BMI. BMI is an estimate of body fat and is calculated from height and weight. For adults, a BMI of 30 or higher is considered obese. Obesity can eventually lead to other health concerns and major illnesses, including:  Stroke.  Coronary artery disease (CAD).  Type 2 diabetes.  Some types of cancer, including cancers of the colon, breast, uterus, and gallbladder.  Osteoarthritis.  High blood pressure (hypertension).  High cholesterol.  Sleep apnea.  Gallbladder stones.  Infertility problems. What are the causes? The main cause of obesity is taking in (consuming) more calories than your body uses for energy. Other factors that contribute to this condition may include:  Being born with genes that make you more likely to become obese.  Having a medical condition that causes obesity. These conditions include: ? Hypothyroidism. ? Polycystic ovarian syndrome (PCOS). ? Binge-eating disorder. ? Cushing syndrome.  Taking certain medicines, such  as steroids, antidepressants, and seizure medicines.  Not being physically active (sedentary lifestyle).  Living where there are limited places to exercise safely or buy healthy foods.  Not getting enough sleep. What increases the risk? The following factors may increase your risk of this condition:  Having a family history of obesity.  Being a woman of African-American descent.  Being a man of Hispanic descent. What are the signs or symptoms? Having excessive body fat is the main symptom of this condition. How is this diagnosed? This condition may be diagnosed based on:  Your symptoms.  Your medical history.  A physical exam. Your health care provider may measure: ? Your BMI. If you are an adult with a BMI between 25 and less than 30, you are considered overweight. If you are an adult with a BMI of 30 or higher, you are considered obese. ? The distances around your hips and your waist (circumferences). These may be compared to each other to help diagnose your condition. ? Your skinfold thickness. Your health care provider may gently pinch a fold of your skin and measure it. How is this treated? Treatment for this condition often includes changing your lifestyle. Treatment may include some or all of the following:  Dietary changes. Work with your health care provider and a dietitian to set a weight-loss goal that is healthy and reasonable for you. Dietary changes may include eating: ? Smaller portions. A portion size is the amount of a particular food that is healthy for you to eat at one time. This varies from person to person. ? Low-calorie or low-fat options. ? More whole grains, fruits, and vegetables.  Regular physical activity. This may include aerobic activity (cardio) and   strength training.  Medicine to help you lose weight. Your health care provider may prescribe medicine if you are unable to lose 1 pound a week after 6 weeks of eating more healthily and doing more  physical activity.  Surgery. Surgical options may include gastric banding and gastric bypass. Surgery may be done if: ? Other treatments have not helped to improve your condition. ? You have a BMI of 40 or higher. ? You have life-threatening health problems related to obesity. Follow these instructions at home:  Eating and drinking   Follow recommendations from your health care provider about what you eat and drink. Your health care provider may advise you to: ? Limit fast foods, sweets, and processed snack foods. ? Choose low-fat options, such as low-fat milk instead of whole milk. ? Eat 5 or more servings of fruits or vegetables every day. ? Eat at home more often. This gives you more control over what you eat. ? Choose healthy foods when you eat out. ? Learn what a healthy portion size is. ? Keep low-fat snacks on hand. ? Avoid sugary drinks, such as soda, fruit juice, iced tea sweetened with sugar, and flavored milk. ? Eat a healthy breakfast.  Drink enough water to keep your urine clear or pale yellow.  Do not go without eating for long periods of time (do not fast) or follow a fad diet. Fasting and fad diets can be unhealthy and even dangerous. Physical Activity  Exercise regularly, as told by your health care provider. Ask your health care provider what types of exercise are safe for you and how often you should exercise.  Warm up and stretch before being active.  Cool down and stretch after being active.  Rest between periods of activity. Lifestyle  Limit the time that you spend in front of your TV, computer, or video game system.  Find ways to reward yourself that do not involve food.  Limit alcohol intake to no more than 1 drink a day for nonpregnant women and 2 drinks a day for men. One drink equals 12 oz of beer, 5 oz of wine, or 1 oz of hard liquor. General instructions  Keep a weight loss journal to keep track of the food you eat and how much you exercise you  get.  Take over-the-counter and prescription medicines only as told by your health care provider.  Take vitamins and supplements only as told by your health care provider.  Consider joining a support group. Your health care provider may be able to recommend a support group.  Keep all follow-up visits as told by your health care provider. This is important. Contact a health care provider if:  You are unable to meet your weight loss goal after 6 weeks of dietary and lifestyle changes. This information is not intended to replace advice given to you by your health care provider. Make sure you discuss any questions you have with your health care provider. Document Released: 01/29/2004 Document Revised: 05/26/2015 Document Reviewed: 10/09/2014 Elsevier Interactive Patient Education  2019 Elsevier Inc.  Preventing Unhealthy Weight Gain, Adult Staying at a healthy weight is important to your overall health. When fat builds up in your body, you may become overweight or obese. Being overweight or obese increases your risk of developing certain health problems, such as heart disease, diabetes, sleeping problems, joint problems, and some types of cancer. Unhealthy weight gain is often the result of making unhealthy food choices or not getting enough exercise. You can make changes   to your lifestyle to prevent obesity and stay as healthy as possible. What nutrition changes can be made?   Eat only as much as your body needs. To do this: ? Pay attention to signs that you are hungry or full. Stop eating as soon as you feel full. ? If you feel hungry, try drinking water first before eating. Drink enough water so your urine is clear or pale yellow. ? Eat smaller portions. Pay attention to portion sizes when eating out. ? Look at serving sizes on food labels. Most foods contain more than one serving per container. ? Eat the recommended number of calories for your gender and activity level. For most active  people, a daily total of 2,000 calories is appropriate. If you are trying to lose weight or are not very active, you may need to eat fewer calories. Talk with your health care provider or a diet and nutrition specialist (dietitian) about how many calories you need each day.  Choose healthy foods, such as: ? Fruits and vegetables. At each meal, try to fill at least half of your plate with fruits and vegetables. ? Whole grains, such as whole-wheat bread, brown rice, and quinoa. ? Lean meats, such as chicken or fish. ? Other healthy proteins, such as beans, eggs, or tofu. ? Healthy fats, such as nuts, seeds, fatty fish, and olive oil. ? Low-fat or fat-free dairy products.  Check food labels, and avoid food and drinks that: ? Are high in calories. ? Have added sugar. ? Are high in sodium. ? Have saturated fats or trans fats.  Cook foods in healthier ways, such as by baking, broiling, or grilling.  Make a meal plan for the week, and shop with a grocery list to help you stay on track with your purchases. Try to avoid going to the grocery store when you are hungry.  When grocery shopping, try to shop around the outside of the store first, where the fresh foods are. Doing this helps you to avoid prepackaged foods, which can be high in sugar, salt (sodium), and fat. What lifestyle changes can be made?   Exercise for 30 or more minutes on 5 or more days each week. Exercising may include brisk walking, yard work, biking, running, swimming, and team sports like basketball and soccer. Ask your health care provider which exercises are safe for you.  Do muscle-strengthening activities, such as lifting weights or using resistance bands, on 2 or more days a week.  Do not use any products that contain nicotine or tobacco, such as cigarettes and e-cigarettes. If you need help quitting, ask your health care provider.  Limit alcohol intake to no more than 1 drink a day for nonpregnant women and 2 drinks a  day for men. One drink equals 12 oz of beer, 5 oz of wine, or 1 oz of hard liquor.  Try to get 7-9 hours of sleep each night. What other changes can be made?  Keep a food and activity journal to keep track of: ? What you ate and how many calories you had. Remember to count the calories in sauces, dressings, and side dishes. ? Whether you were active, and what exercises you did. ? Your calorie, weight, and activity goals.  Check your weight regularly. Track any changes. If you notice you have gained weight, make changes to your diet or activity routine.  Avoid taking weight-loss medicines or supplements. Talk to your health care provider before starting any new medicine or supplement.  Talk   to your health care provider before trying any new diet or exercise plan. Why are these changes important? Eating healthy, staying active, and having healthy habits can help you to prevent obesity. Those changes also:  Help you manage stress and emotions.  Help you connect with friends and family.  Improve your self-esteem.  Improve your sleep.  Prevent long-term health problems. What can happen if changes are not made? Being obese or overweight can cause you to develop joint or bone problems, which can make it hard for you to stay active or do activities you enjoy. Being obese or overweight also puts stress on your heart and lungs and can lead to health problems like diabetes, heart disease, and some cancers. Where to find more information Talk with your health care provider or a dietitian about healthy eating and healthy lifestyle choices. You may also find information from:  U.S. Department of Agriculture, MyPlate: www.choosemyplate.gov  American Heart Association: www.heart.org  Centers for Disease Control and Prevention: www.cdc.gov Summary  Staying at a healthy weight is important to your overall health. It helps you to prevent certain diseases and health problems, such as heart  disease, diabetes, joint problems, sleep disorders, and some types of cancer.  Being obese or overweight can cause you to develop joint or bone problems, which can make it hard for you to stay active or do activities you enjoy.  You can prevent unhealthy weight gain by eating a healthy diet, exercising regularly, not smoking, limiting alcohol, and getting enough sleep.  Talk with your health care provider or a dietitian for guidance about healthy eating and healthy lifestyle choices. This information is not intended to replace advice given to you by your health care provider. Make sure you discuss any questions you have with your health care provider. Document Released: 12/23/2015 Document Revised: 10/01/2016 Document Reviewed: 01/28/2016 Elsevier Interactive Patient Education  2019 Elsevier Inc.  

## 2018-02-02 NOTE — Addendum Note (Signed)
Addended by: LADA, [name] on: 02/02/2018 09:59 AM   Modules accepted: Orders

## 2018-02-02 NOTE — Assessment & Plan Note (Signed)
Limiting saturated fats; check lipids; LDL goal less than 70; limit egg yolks to no more than 3 per week

## 2018-02-02 NOTE — Assessment & Plan Note (Signed)
So proud of patient for getting on the treadmill and watching his diet; ongoing weight loss encouraged

## 2018-02-02 NOTE — Assessment & Plan Note (Addendum)
Patient is aware that he may have cancer; he does not want to see urologist even after discussion about cancer and mets; honor his wishes; he declined DRE but does agree to see what his PSA is; I explained prostate cancer is the #2 leading cause of death in men from cancer

## 2018-02-03 ENCOUNTER — Other Ambulatory Visit: Payer: Self-pay | Admitting: Family Medicine

## 2018-02-03 DIAGNOSIS — E875 Hyperkalemia: Secondary | ICD-10-CM

## 2018-02-03 DIAGNOSIS — E7849 Other hyperlipidemia: Secondary | ICD-10-CM

## 2018-02-03 LAB — PSA: PSA: 20.2 ng/mL — ABNORMAL HIGH (ref <=4.0)

## 2018-02-03 LAB — BASIC METABOLIC PANEL WITH GFR
BUN/Creatinine Ratio: 15 (calc) (ref 6–22)
BUN: 19 mg/dL (ref 7–25)
CO2: 27 mmol/L (ref 20–32)
Calcium: 10 mg/dL (ref 8.6–10.3)
Chloride: 107 mmol/L (ref 98–110)
Creat: 1.29 mg/dL — ABNORMAL HIGH (ref 0.70–1.25)
GFR, Est African American: 67 mL/min/{1.73_m2} (ref >=60)
GFR, Est Non African American: 58 mL/min/{1.73_m2} — ABNORMAL LOW (ref >=60)
Glucose, Bld: 112 mg/dL — ABNORMAL HIGH (ref 65–99)
Potassium: 5.5 mmol/L — ABNORMAL HIGH (ref 3.5–5.3)
Sodium: 143 mmol/L (ref 135–146)

## 2018-02-03 LAB — HEMOGLOBIN A1C
Hgb A1c MFr Bld: 6.3 % of total Hgb — ABNORMAL HIGH (ref <5.7)
Mean Plasma Glucose: 134 (calc)
eAG (mmol/L): 7.4 (calc)

## 2018-02-03 LAB — LIPID PANEL
Cholesterol: 171 mg/dL (ref <200)
HDL: 61 mg/dL (ref >40)
LDL Cholesterol (Calc): 95 mg/dL (calc)
Non-HDL Cholesterol (Calc): 110 mg/dL (calc) (ref <130)
Total CHOL/HDL Ratio: 2.8 (calc) (ref <5.0)
Triglycerides: 67 mg/dL (ref <150)

## 2018-02-03 MED ORDER — ATORVASTATIN CALCIUM 10 MG PO TABS: 10.0000 mg | ORAL_TABLET | Freq: Every day | ORAL | 0 refills | Status: DC

## 2018-02-03 NOTE — Progress Notes (Signed)
Return in one week for [facility] urology referral, concern for prostate cancer

## 2018-02-17 ENCOUNTER — Telehealth: Payer: Self-pay | Admitting: Family Medicine

## 2018-02-17 DIAGNOSIS — B353 Tinea pedis: Secondary | ICD-10-CM

## 2018-02-17 DIAGNOSIS — R972 Elevated prostate specific antigen [PSA]: Secondary | ICD-10-CM

## 2018-02-17 DIAGNOSIS — B351 Tinea unguium: Secondary | ICD-10-CM

## 2018-02-17 NOTE — Assessment & Plan Note (Signed)
Refer to podiatry

## 2018-02-17 NOTE — Assessment & Plan Note (Signed)
Refer to urologist 

## 2018-02-17 NOTE — Telephone Encounter (Signed)
See his note about requesting urologist and foot doctor I will respond directly to him, but did not know how to convert that into a phone note to enter referrals

## 2018-03-16 ENCOUNTER — Ambulatory Visit (INDEPENDENT_AMBULATORY_CARE_PROVIDER_SITE_OTHER): Payer: PPO | Admitting: Urology

## 2018-03-16 ENCOUNTER — Other Ambulatory Visit: Payer: Self-pay

## 2018-03-16 DIAGNOSIS — R972 Elevated prostate specific antigen [PSA]: Secondary | ICD-10-CM | POA: Diagnosis not present

## 2018-03-16 NOTE — Progress Notes (Signed)
03/16/2018 11:37 AM   John Benson 1950/12/15 [id number]  Referring provider: Arnetha Courser, MD [address] [place], [place] [address]  CC: Elevated PSA (20.2)  HPI: I saw John Benson in urology clinic today in consultation for persistently elevated PSA from Dr. Sanda Klein.  Briefly, he is a 68 year old relatively healthy African-American male with past medical history notable for diabetes well controlled with diet who presents with persistently rising PSA.  He was originally seen by Dr. Louis Benson in urology in July 2016 for mildly elevated PSA of 4.2, and Dr. Louis Benson had recommended repeat PSA in biopsy if PSA remains elevated.  His DRE was normal at that time.  He has never undergone prostate biopsy previously.  There is no recent cross-sectional imaging to review.  He has no family history of prostate cancer.  He has erectile dysfunction that did not improve with PDE 5 inhibitors.  He has mild urinary frequency when he drinks coffee, but denies any dysuria, urgency, incontinence, or weak stream.  He denies gross hematuria, flank pain, weight loss, or back pain.  He does have right-sided hip pain for the last few years.  There are no aggravating or alleviating factors.  Severity is severe.  His PSA trend is very concerning as follows: 01/2018: 20.2 10/2017: 19.3 07/2017: 16.6 04/2017: 15.5 01/2017: 14.6 03/2016: 9.4 09/2015: 7.9 07/2014: 5.3  It is unclear why he never followed up with urology, however from our conversation today it sounds like he was in denial regarding his concerning rising PSA.   PMH: Past Medical History:  Diagnosis Date  . Diabetes mellitus without complication ([place])   . History of kidney stones   . Hyperlipidemia   . Hypogonadism in male   . Obesity     Surgical History: Past Surgical History:  Procedure Laterality Date  . LITHOTRIPSY      Allergies:  Allergies  Allergen Reactions  . Testosterone Itching and Rash    Family History: Family  History  Problem Relation Age of Onset  . Diabetes Mother   . Cancer Mother        breast  . Hypertension Father   . Dementia Father   . Diabetes Brother   . Pancreatic disease Brother   . Kidney cancer Neg Hx   . Prostate cancer Neg Hx     Social History:  reports that he has never smoked. He has never used smokeless tobacco. He reports that he does not drink alcohol or use drugs.  ROS: Please see flowsheet from today's date for complete review of systems.  Physical Exam:  Constitutional:  Alert and oriented, No acute distress. Cardiovascular: No clubbing, cyanosis, or edema. Respiratory: Normal respiratory effort, no increased work of breathing. GI: Abdomen is soft, nontender, nondistended, no abdominal masses GU: No CVA tenderness DRE: Grossly abnormal, nodular and firm, 50 g Lymph: No cervical or inguinal lymphadenopathy. Skin: No rashes, bruises or suspicious lesions. Neurologic: Grossly intact, no focal deficits, moving all 4 extremities. Psychiatric: Normal mood and affect.  Laboratory Data: See HPI for PSA history Serum creatinine 1.29, eGFR 67  Pertinent Imaging: None to review  Assessment & Plan:   In summary, the patient is a 68 year old African-American male with a persistently rising PSA over the last 4 years with peak of 20 in January 2020.  His DRE is grossly abnormal and concerning for prostate cancer.  We reviewed the implications of an elevated PSA and the uncertainty surrounding it. In general, a man's PSA increases  with age and is produced by both normal and cancerous prostate tissue. The differential diagnosis for elevated PSA includes BPH, prostate cancer, infection, recent intercourse/ejaculation, recent urethroscopic manipulation (foley placement/cystoscopy) or trauma, and prostatitis.   Management of an elevated PSA can include observation or prostate biopsy and we discussed this in detail. Our goal is to detect clinically significant prostate  cancers, and manage with either active surveillance, surgery, or radiation for localized disease. Risks of prostate biopsy include bleeding, infection (including life threatening sepsis), pain, and lower urinary symptoms. Hematuria, hematospermia, and blood in the stool are all common after biopsy and can persist up to 4 weeks.   I was very honest with the patient that I have a very high suspicion that he has prostate cancer, possibly even locally advanced or metastatic.  I recommended proceeding with prostate biopsy ASAP for tissue diagnosis, and I discussed the likely need for imaging to rule out metastatic disease with CT scan and bone scan pending pathology results.  We briefly reviewed treatment strategies for prostate cancer depending on on staging, and we reviewed that surgery and radiation are typically used for localized disease, however patients with metastatic disease are typically treated with hormonal therapies, antiandrogens, and sometimes chemotherapy.  RTC for prostate biopsy  Billey Co, MD  [facility] [address] [place], [place] [address] [phone]

## 2018-03-17 ENCOUNTER — Ambulatory Visit (INDEPENDENT_AMBULATORY_CARE_PROVIDER_SITE_OTHER): Payer: PPO | Admitting: Nurse Practitioner

## 2018-03-17 ENCOUNTER — Encounter: Payer: Self-pay | Admitting: Nurse Practitioner

## 2018-03-17 ENCOUNTER — Telehealth: Payer: Self-pay | Admitting: Urology

## 2018-03-17 VITALS — BP 132/70 | HR 62 | Temp 97.7°F | Resp 14 | Ht 69.5 in | Wt 229.8 lb

## 2018-03-17 DIAGNOSIS — R0981 Nasal congestion: Secondary | ICD-10-CM | POA: Diagnosis not present

## 2018-03-17 DIAGNOSIS — J0100 Acute maxillary sinusitis, unspecified: Secondary | ICD-10-CM | POA: Diagnosis not present

## 2018-03-17 MED ORDER — FLUTICASONE PROPIONATE 50 MCG/ACT NA SUSP: 2.0000 | Freq: Every day | NASAL | 6 refills | Status: DC

## 2018-03-17 MED ORDER — LORATADINE 10 MG PO TABS: 10.0000 mg | ORAL_TABLET | Freq: Every day | ORAL | 11 refills | Status: DC

## 2018-03-17 MED ORDER — AMOXICILLIN-POT CLAVULANATE 875-125 MG PO TABS: 1.0000 | ORAL_TABLET | Freq: Two times a day (BID) | ORAL | 0 refills | Status: DC

## 2018-03-17 NOTE — Telephone Encounter (Signed)
lm for pt to cb to confirm BX and results app we need to go over the [place] instructions with the patient appts and instrustions have been mailed 03-17-18 [facility]

## 2018-03-17 NOTE — Patient Instructions (Addendum)
- Take claritin daily for one week, flonase 1 spray in each nostril twice a day as needed for congestion- if your symptoms do not improve with this in three days pick up antibiotic and take it for the entire course.  Please take your antibiotic as prescribed with food on your stomach to prevent nausea and upset stomach. Take the antibiotic for the entire course, even if your symptoms resolve before the course if completed. Using antibiotics inappropriately can make it harder to treat future infections. If you have any concerning side effects please stop the medication and let us know immediately. I do recommend taking a probiotic to replenish your good gut health anytime you take an antibiotic. You can get probiotics in types of yogurt like activia, or other food/drinks such as kimchi, kombucha , sauerkraut, or you can take an over the counter supplement.    It  drink plenty of fluids, rest. Cover your nose/mouth when you cough or sneeze and wash your hands well and often. Here are some helpful things you can use or pick up over the counter from the pharmacy to help with your symptoms:   For Fever/Pain: Acetaminophen every 6 hours as needed (maximum of 3000mg  a day). If you are still uncomfortable you can add ibuprofen OR naproxen  For coughing: try dextromethorphan for a cough suppressant, and/or a cool mist humidifier, lozenges  For sore throat: saline gargles, honey herbal tea, lozenges, throat spray  To dry out your nose: try an antihistamine like loratadine (non-sedating) or diphenhydramine (sedating) or others To relieve a stuffy nose: try a decongestant like flonase neti pot To make blowing your nose easier and relieve chest congestion: guaifenesin 400mg  every 4-6 hours of guaifenesin ER [phone] mg every 12 hours. Do not take more than 2,400mg  a day.    Sinusitis, Adult Sinusitis is inflammation of your sinuses. Sinuses are hollow spaces in the bones around your face. Your sinuses are  located:  Around your eyes.  In the middle of your forehead.  Behind your nose.  In your cheekbones. Mucus normally drains out of your sinuses. When your nasal tissues become inflamed or swollen, mucus can become trapped or blocked. This allows bacteria, viruses, and fungi to grow, which leads to infection. Most infections of the sinuses are caused by a virus. Sinusitis can develop quickly. It can last for up to 4 weeks (acute) or for more than 12 weeks (chronic). Sinusitis often develops after a cold. What are the causes? This condition is caused by anything that creates swelling in the sinuses or stops mucus from draining. This includes:  Allergies.  Asthma.  Infection from bacteria or viruses.  Deformities or blockages in your nose or sinuses.  Abnormal growths in the nose (nasal polyps).  Pollutants, such as chemicals or irritants in the air.  Infection from fungi (rare). What increases the risk? You are more likely to develop this condition if you:  Have a weak body defense system (immune system).  Do a lot of swimming or diving.  Overuse nasal sprays.  Smoke. What are the signs or symptoms? The main symptoms of this condition are pain and a feeling of pressure around the affected sinuses. Other symptoms include:  Stuffy nose or congestion.  Thick drainage from your nose.  Swelling and warmth over the affected sinuses.  Headache.  Upper toothache.  A cough that may get worse at night.  Extra mucus that collects in the throat or the back of the nose (postnasal drip).  Decreased sense of smell and taste.  Fatigue.  A fever.  Sore throat.  Bad breath. How is this diagnosed? This condition is diagnosed based on:  Your symptoms.  Your medical history.  A physical exam.  Tests to find out if your condition is acute or chronic. This may include: ? Checking your nose for nasal polyps. ? Viewing your sinuses using a device that has a light  (endoscope). ? Testing for allergies or bacteria. ? Imaging tests, such as an MRI or CT scan. In rare cases, a bone biopsy may be done to rule out more serious types of fungal sinus disease. How is this treated? Treatment for sinusitis depends on the cause and whether your condition is chronic or acute.  If caused by a virus, your symptoms should go away on their own within 10 days. You may be given medicines to relieve symptoms. They include: ? Medicines that shrink swollen nasal passages (topical intranasal decongestants). ? Medicines that treat allergies (antihistamines). ? A spray that eases inflammation of the nostrils (topical intranasal corticosteroids). ? Rinses that help get rid of thick mucus in your nose (nasal saline washes).  If caused by bacteria, your health care provider may recommend waiting to see if your symptoms improve. Most bacterial infections will get better without antibiotic medicine. You may be given antibiotics if you have: ? A severe infection. ? A weak immune system.  If caused by narrow nasal passages or nasal polyps, you may need to have surgery. Follow these instructions at home: Medicines  Take, use, or apply over-the-counter and prescription medicines only as told by your health care provider. These may include nasal sprays.  If you were prescribed an antibiotic medicine, take it as told by your health care provider. Do not stop taking the antibiotic even if you start to feel better. Hydrate and humidify   Drink enough fluid to keep your urine pale yellow. Staying hydrated will help to thin your mucus.  Use a cool mist humidifier to keep the humidity level in your home above 50%.  Inhale steam for 10-15 minutes, 3-4 times a day, or as told by your health care provider. You can do this in the bathroom while a hot shower is running.  Limit your exposure to cool or dry air. Rest  Rest as much as possible.  Sleep with your head raised  (elevated).  Make sure you get enough sleep each night. General instructions   Apply a warm, moist washcloth to your face 3-4 times a day or as told by your health care provider. This will help with discomfort.  Wash your hands often with soap and water to reduce your exposure to germs. If soap and water are not available, use hand sanitizer.  Do not smoke. Avoid being around people who are smoking (secondhand smoke).  Keep all follow-up visits as told by your health care provider. This is important. Contact a health care provider if:  You have a fever.  Your symptoms get worse.  Your symptoms do not improve within 10 days. Get help right away if:  You have a severe headache.  You have persistent vomiting.  You have severe pain or swelling around your face or eyes.  You have vision problems.  You develop confusion.  Your neck is stiff.  You have trouble breathing. Summary  Sinusitis is soreness and inflammation of your sinuses. Sinuses are hollow spaces in the bones around your face.  This condition is caused by nasal tissues that  become inflamed or swollen. The swelling traps or blocks the flow of mucus. This allows bacteria, viruses, and fungi to grow, which leads to infection.  If you were prescribed an antibiotic medicine, take it as told by your health care provider. Do not stop taking the antibiotic even if you start to feel better.  Keep all follow-up visits as told by your health care provider. This is important. This information is not intended to replace advice given to you by your health care provider. Make sure you discuss any questions you have with your health care provider. Document Released: 12/21/2004 Document Revised: 05/23/2017 Document Reviewed: 05/23/2017 Elsevier Interactive Patient Education  2019 [employer].

## 2018-03-17 NOTE — Progress Notes (Signed)
Name: John Benson   MRN: [id number]    DOB: 05/14/1950   Date:03/17/2018       Progress Note  Subjective  Chief Complaint  Chief Complaint  Patient presents with  . Sinusitis    onset 2 weeks. Symptoms include: pressure, coughing up phlegm and congestion/stuffy    HPI  Patient states 2 weeks ago has sneezing, dry cough and itchy throat. He took mucinex and sudafed with mild relief. States has had increase nasal congestion and sinus fullness and hoarse voice.   Denies fevers, chills, shortness of breath  Patient Active Problem List   Diagnosis Date Noted  . Obesity (BMI 30.0-34.9) 07/27/2017  . Tinea pedis 04/27/2017  . Chronic kidney disease, stage II (mild) 04/27/2017  . Preventative health care 04/25/2017  . Vitamin D deficiency 07/22/2016  . Carpal tunnel syndrome 07/15/2014  . 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 HLD (hyperlipidemia) 07/15/2014  . Male hypogonadism 07/15/2014  . Spermatocele 07/15/2014  . Type 2 diabetes mellitus without complication ([place]) 73/71/0626  . PSA elevation 06/24/2014  . Calculus of kidney 10/08/2012  . Decreased libido 05/22/2008    Past Medical History:  Diagnosis Date  . Diabetes mellitus without complication ([place])   . History of kidney stones   . Hyperlipidemia   . Hypogonadism in male   . Obesity     Past Surgical History:  Procedure Laterality Date  . LITHOTRIPSY      Social History   Tobacco Use  . Smoking status: Never Smoker  . Smokeless tobacco: Never Used  Substance Use Topics  . Alcohol use: No    Alcohol/week: 0.0 standard drinks     Current Outpatient Medications:  .  aspirin (ASPIRIN ADULT LOW STRENGTH) 81 MG EC tablet, Take 81 mg by mouth daily. , Disp: , Rfl:  .  Multiple Vitamin (MULTIVITAMIN) tablet, Take 1 tablet by mouth daily., Disp: , Rfl:  .  Omega 3-6-9 Fatty Acids (OMEGA DHA PO), Take 1 capsule by mouth daily., Disp: , Rfl:  .  atorvastatin (LIPITOR) 10 MG tablet, Take 1 tablet (10 mg total) by  mouth daily. (Patient not taking: Reported on 03/17/2018), Disp: 90 tablet, Rfl: 0  Allergies  Allergen Reactions  . Testosterone Itching and Rash    ROS   No other specific complaints in a complete review of systems (except as listed in HPI above).  Objective  Vitals:   03/17/18 0834  BP: 132/70  Pulse: 62  Resp: 14  Temp: 97.7 F (36.5 C)  TempSrc: Oral  SpO2: 97%  Weight: 229 lb 12.8 oz (104.2 kg)  Height: 5' 9.5" (1.765 m)    Body mass index is 33.45 kg/m.  Nursing Note and Vital Signs reviewed.  Physical Exam HENT:     Head: Normocephalic and atraumatic.     Right Ear: Hearing, tympanic membrane, ear canal and external ear normal.     Left Ear: Hearing, tympanic membrane, ear canal and external ear normal.     Nose: Congestion present.     Right Sinus: Maxillary sinus tenderness present. No frontal sinus tenderness.     Left Sinus: Maxillary sinus tenderness present. No frontal sinus tenderness.     Mouth/Throat:     Mouth: Mucous membranes are moist.     Pharynx: Uvula midline. No oropharyngeal exudate or posterior oropharyngeal erythema.  Eyes:     General:        Right eye: No discharge.        Left eye: No discharge.  Conjunctiva/sclera: Conjunctivae normal.  Neck:     Musculoskeletal: Normal range of motion.  Cardiovascular:     Rate and Rhythm: Normal rate.  Pulmonary:     Effort: Pulmonary effort is normal.     Breath sounds: Normal breath sounds.  Lymphadenopathy:     Cervical: No cervical adenopathy.  Skin:    General: Skin is warm and dry.     Findings: No rash.  Neurological:     Mental Status: He is alert.  Psychiatric:        Judgment: Judgment normal.        No results found for this or any previous visit (from the past 48 hour(s)).  Assessment & Plan  1. Nasal congestion - loratadine (CLARITIN) 10 MG tablet; Take 1 tablet (10 mg total) by mouth daily.  Dispense: 30 tablet; Refill: 11 - fluticasone (FLONASE) 50 MCG/ACT  nasal spray; Place 2 sprays into both nostrils daily.  Dispense: 16 g; Refill: 6  2. Acute non-recurrent maxillary sinusitis Delayed antibiotic prescribring employed; not to fill unless symptoms not improving in 3 days.  - amoxicillin-clavulanate (AUGMENTIN) 875-125 MG tablet; Take 1 tablet by mouth 2 (two) times daily.  Dispense: 20 tablet; Refill: 0   -Red flags and when to present for emergency care or RTC including fever >101.52F, chest pain, shortness of breath, new/worsening/un-resolving symptoms,  reviewed with patient at time of visit. Follow up and care instructions discussed and provided in AVS.

## 2018-03-24 ENCOUNTER — Other Ambulatory Visit: Payer: Self-pay

## 2018-03-24 ENCOUNTER — Ambulatory Visit: Payer: PPO | Admitting: Podiatry

## 2018-03-24 ENCOUNTER — Encounter: Payer: Self-pay | Admitting: Podiatry

## 2018-03-24 VITALS — BP 150/66 | HR 65

## 2018-03-24 DIAGNOSIS — B351 Tinea unguium: Secondary | ICD-10-CM | POA: Diagnosis not present

## 2018-03-24 DIAGNOSIS — M79676 Pain in unspecified toe(s): Secondary | ICD-10-CM | POA: Diagnosis not present

## 2018-03-24 DIAGNOSIS — E0843 Diabetes mellitus due to underlying condition with diabetic autonomic (poly)neuropathy: Secondary | ICD-10-CM

## 2018-03-28 ENCOUNTER — Other Ambulatory Visit: Payer: PPO | Admitting: Urology

## 2018-03-28 NOTE — Progress Notes (Signed)
   SUBJECTIVE Patient with a history of diabetes mellitus presents to office today complaining of elongated, thickened nails that cause pain while ambulating in shoes. He is unable to trim his own nails. Patient is here for further evaluation and treatment.   Past Medical History:  Diagnosis Date  . Diabetes mellitus without complication ([place])   . History of kidney stones   . Hyperlipidemia   . Hypogonadism in male   . Obesity     OBJECTIVE General Patient is awake, alert, and oriented x 3 and in no acute distress. Derm Skin is dry and supple bilateral. Negative open lesions or macerations. Remaining integument unremarkable. Nails are tender, long, thickened and dystrophic with subungual debris, consistent with onychomycosis, 1-5 bilateral. No signs of infection noted. Vasc  DP and PT pedal pulses palpable bilaterally. Temperature gradient within normal limits.  Neuro Epicritic and protective threshold sensation diminished bilaterally.  Musculoskeletal Exam No symptomatic pedal deformities noted bilateral. Muscular strength within normal limits.  ASSESSMENT 1. Diabetes Mellitus w/ peripheral neuropathy 2. Onychomycosis of nail due to dermatophyte bilateral 3. Pain in foot bilateral  PLAN OF CARE 1. Patient evaluated today. 2. Instructed to maintain good pedal hygiene and foot care. Stressed importance of controlling blood sugar.  3. Mechanical debridement of nails 1-5 bilaterally performed using a nail nipper. Filed with dremel without incident.  4. Return to clinic in 3 mos.     Edrick Kins, DPM [facility]  Dr. Edrick Kins, [place]                                        [place], [place] [address]                Office [phone]  Fax [phone]

## 2018-04-12 ENCOUNTER — Ambulatory Visit: Payer: PPO | Admitting: Urology

## 2018-04-18 ENCOUNTER — Ambulatory Visit: Payer: 59

## 2018-04-20 ENCOUNTER — Ambulatory Visit: Payer: PPO | Admitting: Family Medicine

## 2018-04-27 ENCOUNTER — Other Ambulatory Visit: Payer: PPO | Admitting: Urology

## 2018-05-11 ENCOUNTER — Ambulatory Visit: Payer: PPO | Admitting: Urology

## 2018-05-18 ENCOUNTER — Other Ambulatory Visit: Payer: Self-pay | Admitting: Urology

## 2018-05-18 ENCOUNTER — Ambulatory Visit (INDEPENDENT_AMBULATORY_CARE_PROVIDER_SITE_OTHER): Payer: PPO | Admitting: Urology

## 2018-05-18 ENCOUNTER — Other Ambulatory Visit: Payer: Self-pay

## 2018-05-18 ENCOUNTER — Encounter: Payer: Self-pay | Admitting: Urology

## 2018-05-18 VITALS — BP 158/77 | HR 60 | Ht 69.5 in | Wt 230.2 lb

## 2018-05-18 DIAGNOSIS — N4289 Other specified disorders of prostate: Secondary | ICD-10-CM | POA: Diagnosis not present

## 2018-05-18 DIAGNOSIS — C61 Malignant neoplasm of prostate: Secondary | ICD-10-CM | POA: Diagnosis not present

## 2018-05-18 DIAGNOSIS — R972 Elevated prostate specific antigen [PSA]: Secondary | ICD-10-CM

## 2018-05-18 MED ORDER — GENTAMICIN SULFATE 40 MG/ML IJ SOLN
80.0000 mg | Freq: Once | INTRAMUSCULAR | Status: AC
  Administered 2018-05-18: 80 mg via INTRAMUSCULAR

## 2018-05-18 MED ORDER — LEVOFLOXACIN 500 MG PO TABS
500.0000 mg | ORAL_TABLET | Freq: Once | ORAL | Status: AC
  Administered 2018-05-18: 500 mg via ORAL

## 2018-05-18 MED ORDER — LIDOCAINE HCL URETHRAL/MUCOSAL 2 % EX GEL
1.0000 "application " | Freq: Once | CUTANEOUS | Status: AC
  Administered 2018-05-18: 1 via URETHRAL

## 2018-05-18 NOTE — Progress Notes (Signed)
   05/18/18  Indication: Elevated PSA, 20  Prostate Biopsy Procedure   Informed consent was obtained, and we discussed the risks of bleeding and infection/sepsis. A time out was performed to ensure correct patient identity.  Pre-Procedure - Gentamicin and levaquin given for antibiotic prophylaxis - Transrectal Ultrasound performed revealing a 35 gm prostate, PSA density 0.57 - Some irregularity at base of right SV - Irregular border at posterior bladder wall concerning for possible bladder tumor.   CYSTOSCOPY: At this point, in standard sterile fashion sterile flexible cystoscopy was performed to rule out bladder mass. Urethra was widely patent and normal appearing. Small prostate. Bladder empty and the infoldings of the posterior bladder wall likely caused the above findings. The bladder was filled and thorough cystoscopy revealed no abnormalities. No median lobe.  Procedure: - Prostate block performed using 10 cc 1% lidocaine and biopsies taken from sextant areas, a total of 12 under ultrasound guidance.  Post-Procedure: - Patient tolerated the procedure well - He was counseled to seek immediate medical attention if experiences significant bleeding, fevers, or severe pain - Return in one week to discuss biopsy results  Assessment/ Plan: Will follow up in 1-2 weeks to discuss pathology  Nickolas Madrid, MD 05/18/2018

## 2018-05-18 NOTE — Addendum Note (Signed)
Addended by: Donalee Citrin on: 05/18/2018 12:29 PM   Modules accepted: Orders

## 2018-05-24 ENCOUNTER — Other Ambulatory Visit: Payer: Self-pay | Admitting: Urology

## 2018-05-24 DIAGNOSIS — C61 Malignant neoplasm of prostate: Secondary | ICD-10-CM

## 2018-05-24 LAB — PATHOLOGY REPORT

## 2018-05-24 NOTE — Progress Notes (Signed)
UROLOGY TELEPHONE NOTE  68 year old African-American male with PSA of 20 and grossly abnormal DRE who underwent prostate biopsy 05/18/2018.  Biopsy shows high risk prostate cancer with 8/12 cores positive for Gleason score 5+3 = 8 and 4+4 = 8 disease.   I discussed these results over the phone with the patient today.  We discussed the need for staging imaging with bone scan and CT abdomen pelvis before deciding on a treatment strategy, as the management of localized prostate cancer is very different than metastatic prostate cancer.    We will try to obtain these imaging studies before his follow-up visit with me on 06/01/2018 so that we can discuss imaging results and treatment options at that time.  Nickolas Madrid, MD 05/24/2018

## 2018-05-25 ENCOUNTER — Other Ambulatory Visit: Payer: Self-pay

## 2018-05-25 ENCOUNTER — Ambulatory Visit (INDEPENDENT_AMBULATORY_CARE_PROVIDER_SITE_OTHER): Payer: PPO

## 2018-05-25 VITALS — BP 134/72 | HR 58 | Temp 98.0°F | Resp 16 | Ht 70.0 in | Wt 228.5 lb

## 2018-05-25 DIAGNOSIS — Z0000 Encounter for general adult medical examination without abnormal findings: Secondary | ICD-10-CM | POA: Diagnosis not present

## 2018-05-25 NOTE — Patient Instructions (Signed)
John Benson , Thank you for taking time to come for your Medicare Wellness Visit. I appreciate your ongoing commitment to your health goals. Please review the following plan we discussed and let me know if I can assist you in the future.   Screening recommendations/referrals: Colonoscopy: done 12/25/13. Repeat in 2025. Recommended yearly ophthalmology/optometry visit for glaucoma screening and checkup Recommended yearly dental visit for hygiene and checkup  Vaccinations: Influenza vaccine: done 09/29/17 Pneumococcal vaccine: done 04/30/13 Tdap vaccine: done 07/21/10 Shingles vaccine: Shingrix discussed. Please contact your pharmacy for coverage information.   Advanced directives: Advance directive discussed with you today. I have provided a copy for you to complete at home and have notarized. Once this is complete please bring a copy in to our office so we can scan it into your chart.  Conditions/risks identified: Continue healthy eating and physical activity for desired weight loss.   Next appointment: Please follow up in one year for your Medicare Annual Wellness visit.    Preventive Care 66 Years and Older, Male Preventive care refers to lifestyle choices and visits with your health care provider that can promote health and wellness. What does preventive care include?  A yearly physical exam. This is also called an annual well check.  Dental exams once or twice a year.  Routine eye exams. Ask your health care provider how often you should have your eyes checked.  Personal lifestyle choices, including:  Daily care of your teeth and gums.  Regular physical activity.  Eating a healthy diet.  Avoiding tobacco and drug use.  Limiting alcohol use.  Practicing safe sex.  Taking low doses of aspirin every day.  Taking vitamin and mineral supplements as recommended by your health care provider. What happens during an annual well check? The services and screenings done by your  health care provider during your annual well check will depend on your age, overall health, lifestyle risk factors, and family history of disease. Counseling  Your health care provider may ask you questions about your:  Alcohol use.  Tobacco use.  Drug use.  Emotional well-being.  Home and relationship well-being.  Sexual activity.  Eating habits.  History of falls.  Memory and ability to understand (cognition).  Work and work Statistician. Screening  You may have the following tests or measurements:  Height, weight, and BMI.  Blood pressure.  Lipid and cholesterol levels. These may be checked every 5 years, or more frequently if you are over 28 years old.  Skin check.  Lung cancer screening. You may have this screening every year starting at age 55 if you have a 30-pack-year history of smoking and currently smoke or have quit within the past 15 years.  Fecal occult blood test (FOBT) of the stool. You may have this test every year starting at age 58.  Flexible sigmoidoscopy or colonoscopy. You may have a sigmoidoscopy every 5 years or a colonoscopy every 10 years starting at age 11.  Prostate cancer screening. Recommendations will vary depending on your family history and other risks.  Hepatitis C blood test.  Hepatitis B blood test.  Sexually transmitted disease (STD) testing.  Diabetes screening. This is done by checking your blood sugar (glucose) after you have not eaten for a while (fasting). You may have this done every 1-3 years.  Abdominal aortic aneurysm (AAA) screening. You may need this if you are a current or former smoker.  Osteoporosis. You may be screened starting at age 35 if you are at high risk.  Talk with your health care provider about your test results, treatment options, and if necessary, the need for more tests. Vaccines  Your health care provider may recommend certain vaccines, such as:  Influenza vaccine. This is recommended every year.   Tetanus, diphtheria, and acellular pertussis (Tdap, Td) vaccine. You may need a Td booster every 10 years.  Zoster vaccine. You may need this after age 47.  Pneumococcal 13-valent conjugate (PCV13) vaccine. One dose is recommended after age 45.  Pneumococcal polysaccharide (PPSV23) vaccine. One dose is recommended after age 56. Talk to your health care provider about which screenings and vaccines you need and how often you need them. This information is not intended to replace advice given to you by your health care provider. Make sure you discuss any questions you have with your health care provider. Document Released: 01/17/2015 Document Revised: 09/10/2015 Document Reviewed: 10/22/2014 Elsevier Interactive Patient Education  2017 [place] Prevention in the Home Falls can cause injuries. They can happen to people of all ages. There are many things you can do to make your home safe and to help prevent falls. What can I do on the outside of my home?  Regularly fix the edges of walkways and driveways and fix any cracks.  Remove anything that might make you trip as you walk through a door, such as a raised step or threshold.  Trim any bushes or trees on the path to your home.  Use bright outdoor lighting.  Clear any walking paths of anything that might make someone trip, such as rocks or tools.  Regularly check to see if handrails are loose or broken. Make sure that both sides of any steps have handrails.  Any raised decks and porches should have guardrails on the edges.  Have any leaves, snow, or ice cleared regularly.  Use sand or salt on walking paths during winter.  Clean up any spills in your garage right away. This includes oil or grease spills. What can I do in the bathroom?  Use night lights.  Install grab bars by the toilet and in the tub and shower. Do not use towel bars as grab bars.  Use non-skid mats or decals in the tub or shower.  If you need to  sit down in the shower, use a plastic, non-slip stool.  Keep the floor dry. Clean up any water that spills on the floor as soon as it happens.  Remove soap buildup in the tub or shower regularly.  Attach bath mats securely with double-sided non-slip rug tape.  Do not have throw rugs and other things on the floor that can make you trip. What can I do in the bedroom?  Use night lights.  Make sure that you have a light by your bed that is easy to reach.  Do not use any sheets or blankets that are too big for your bed. They should not hang down onto the floor.  Have a firm chair that has side arms. You can use this for support while you get dressed.  Do not have throw rugs and other things on the floor that can make you trip. What can I do in the kitchen?  Clean up any spills right away.  Avoid walking on wet floors.  Keep items that you use a lot in easy-to-reach places.  If you need to reach something above you, use a strong step stool that has a grab bar.  Keep electrical cords out of the way.  Do not use floor polish or wax that makes floors slippery. If you must use wax, use non-skid floor wax.  Do not have throw rugs and other things on the floor that can make you trip. What can I do with my stairs?  Do not leave any items on the stairs.  Make sure that there are handrails on both sides of the stairs and use them. Fix handrails that are broken or loose. Make sure that handrails are as long as the stairways.  Check any carpeting to make sure that it is firmly attached to the stairs. Fix any carpet that is loose or worn.  Avoid having throw rugs at the top or bottom of the stairs. If you do have throw rugs, attach them to the floor with carpet tape.  Make sure that you have a light switch at the top of the stairs and the bottom of the stairs. If you do not have them, ask someone to add them for you. What else can I do to help prevent falls?  Wear shoes that:  Do not  have high heels.  Have rubber bottoms.  Are comfortable and fit you well.  Are closed at the toe. Do not wear sandals.  If you use a stepladder:  Make sure that it is fully opened. Do not climb a closed stepladder.  Make sure that both sides of the stepladder are locked into place.  Ask someone to hold it for you, if possible.  Clearly mark and make sure that you can see:  Any grab bars or handrails.  First and last steps.  Where the edge of each step is.  Use tools that help you move around (mobility aids) if they are needed. These include:  Canes.  Walkers.  Scooters.  Crutches.  Turn on the lights when you go into a dark area. Replace any light bulbs as soon as they burn out.  Set up your furniture so you have a clear path. Avoid moving your furniture around.  If any of your floors are uneven, fix them.  If there are any pets around you, be aware of where they are.  Review your medicines with your doctor. Some medicines can make you feel dizzy. This can increase your chance of falling. Ask your doctor what other things that you can do to help prevent falls. This information is not intended to replace advice given to you by your health care provider. Make sure you discuss any questions you have with your health care provider. Document Released: 10/17/2008 Document Revised: 05/29/2015 Document Reviewed: 01/25/2014 Elsevier Interactive Patient Education  2017 [employer].

## 2018-05-25 NOTE — Progress Notes (Signed)
Subjective:   John Benson is a 68 y.o. male who presents for an Initial Medicare Annual Wellness Visit.  Review of Systems   Cardiac Risk Factors include: diabetes mellitus;male gender;obesity (BMI >30kg/m2);advanced age (>75men, >42 women)    Objective:    Today's Vitals   05/25/18 1329  BP: 134/72  Pulse: (!) 58  Resp: 16  Temp: 98 F (36.7 C)  TempSrc: Oral  SpO2: 98%  Weight: 228 lb 8 oz (103.6 kg)  Height: 5\' 10"  (1.778 m)   Body mass index is 32.79 kg/m.  Advanced Directives 05/25/2018 10/22/2016 07/22/2016 04/16/2016 04/02/2016 09/30/2015 06/30/2015  Does Patient Have a Medical Advance Directive? No No No No No No No  Would patient like information on creating a medical advance directive? Yes (MAU/Ambulatory/Procedural Areas - Information given) - - - - No - patient declined information No - patient declined information    Current Medications (verified) Outpatient Encounter Medications as of 05/25/2018  Medication Sig  . aspirin (ASPIRIN ADULT LOW STRENGTH) 81 MG EC tablet Take 81 mg by mouth daily.   . Multiple Vitamin (MULTIVITAMIN) tablet Take 1 tablet by mouth daily.  Ernestine Conrad 3-6-9 Fatty Acids (OMEGA DHA PO) Take 1 capsule by mouth daily.   No facility-administered encounter medications on file as of 05/25/2018.     Allergies (verified) Testosterone   History: Past Medical History:  Diagnosis Date  . Benign essential HTN   . Calculus of kidney   . Carpal tunnel syndrome   . Chronic kidney disease, stage II (mild)   . Diabetes mellitus without complication ([place])   . History of kidney stones   . HTN (hypertension)   . Hyperlipidemia   . Hypogonadism in male   . Obesity    Past Surgical History:  Procedure Laterality Date  . LITHOTRIPSY     Family History  Problem Relation Age of Onset  . Diabetes Mother   . Cancer Mother        breast  . Vision loss Mother   . Hypertension Father   . Dementia Father   . Diabetes Brother   . Early death  Brother   . Pancreatic disease Brother   . Early death Sister   . Kidney disease Sister   . Kidney cancer Neg Hx   . Prostate cancer Neg Hx    Social History   Socioeconomic History  . Marital status: Married    Spouse name: Not on file  . Number of children: Not on file  . Years of education: Not on file  . Highest education level: Not on file  Occupational History  . Not on file  Social Needs  . Financial resource strain: Not hard at all  . Food insecurity:    Worry: Never true    Inability: Never true  . Transportation needs:    Medical: No    Non-medical: No  Tobacco Use  . Smoking status: Never Smoker  . Smokeless tobacco: Never Used  Substance and Sexual Activity  . Alcohol use: No    Alcohol/week: 0.0 standard drinks  . Drug use: No  . Sexual activity: Yes  Lifestyle  . Physical activity:    Days per week: 6 days    Minutes per session: 60 min  . Stress: Not at all  Relationships  . Social connections:    Talks on phone: More than three times a week    Gets together: Three times a week    Attends religious service:  More than 4 times per year    Active member of club or organization: No    Attends meetings of clubs or organizations: Never    Relationship status: Married  Other Topics Concern  . Not on file  Social History Narrative  . Not on file   Tobacco Counseling Counseling given: Not Answered   Clinical Intake:  Pre-visit preparation completed: Yes  Pain : No/denies pain     BMI - recorded: 32.79 Nutritional Status: BMI > 30  Obese Nutritional Risks: None Diabetes: Yes(borderline) CBG done?: No Did pt. bring in CBG monitor from home?: No  Nutrition Risk Assessment:  Has the patient had any N/V/D within the last 2 months?  No  Does the patient have any non-healing wounds?  No  Has the patient had any unintentional weight loss or weight gain?  No   Diabetes:  Is the patient diabetic?  Yes  If diabetic, was a CBG obtained today?   No  Did the patient bring in their glucometer from home?  No  How often do you monitor your CBG's? Pt does not actively check his blood sugar, diabetes maintained with diet and exercise.   Financial Strains and Diabetes Management:  Are you having any financial strains with the device, your supplies or your medication? No .  Does the patient want to be seen by Chronic Care Management for management of their diabetes?  No  Would the patient like to be referred to a Nutritionist or for Diabetic Management?  No   Diabetic Exams:  Diabetic Eye Exam: Completed 01/27/18 negative retinopathy.  Diabetic Foot Exam: Completed 02/02/18.  How often do you need to have someone help you when you read instructions, pamphlets, or other written materials from your doctor or pharmacy?: 1 - Never  Interpreter Needed?: No  Information entered by :: Clemetine Marker LPN  Activities of Daily Living In your present state of health, do you have any difficulty performing the following activities: 05/25/2018 03/17/2018  Hearing? N N  Comment declines hearing aids -  Vision? N N  Comment wears glasses -  Difficulty concentrating or making decisions? N N  Walking or climbing stairs? N N  Dressing or bathing? N N  Doing errands, shopping? N N  Preparing Food and eating ? N -  Using the Toilet? N -  In the past six months, have you accidently leaked urine? N -  Do you have problems with loss of bowel control? N -  Managing your Medications? N -  Managing your Finances? N -  Housekeeping or managing your Housekeeping? N -  Some recent data might be hidden     Immunizations and Health Maintenance Immunization History  Administered Date(s) Administered  . Influenza, Seasonal, Injecte, Preservative Fre 10/12/2013  . Influenza,inj,Quad PF,6+ Mos 10/26/2012, 09/30/2015, 10/22/2016, 09/29/2017  . Influenza-Unspecified 09/25/2014, 09/29/2017  . Pneumococcal Conjugate-13 04/30/2013  . Pneumococcal Polysaccharide-23  07/21/2010  . Tdap 07/21/2010   Health Maintenance Due  Topic Date Due  . PNA vac Low Risk Adult (2 of 2 - PPSV23) 12/25/2017    Patient Care Team: Lada, [name], MD as PCP - General (Family Medicine)  Indicate any recent Medical Services you may have received from other than [facility] providers in the past year (date may be approximate).    Assessment:   This is a routine wellness examination for Gwyndolyn Saxon.  Hearing/Vision screen Hearing Screening Comments: Pt denies hearing difficulty Vision Screening Comments: [facility] screen[facility]  Dietary issues and exercise activities discussed: Current Exercise Habits: Home exercise routine, Type of exercise: treadmill, Time (Minutes): 60, Frequency (Times/Week): 6, Weekly Exercise (Minutes/Week): 360, Intensity: Moderate, Exercise limited by: None identified  Goals   None    Depression Screen PHQ 2/9 Scores 05/25/2018 03/17/2018 02/02/2018 10/27/2017  PHQ - 2 Score 0 0 0 0  PHQ- 9 Score 0 0 0 0    Fall Risk Fall Risk  05/25/2018 03/17/2018 02/02/2018 10/27/2017 07/27/2017  Falls in the past year? 0 0 0 No No  Number falls in past yr: 0 0 0 - -  Injury with Fall? 0 0 0 - -  Follow up Falls prevention discussed - - - -   FALL RISK PREVENTION PERTAINING TO THE HOME:  Any stairs in or around the home? No  If so, do they handrails? No   Home free of loose throw rugs in walkways, pet beds, electrical cords, etc? Yes  Adequate lighting in your home to reduce risk of falls? Yes   ASSISTIVE DEVICES UTILIZED TO PREVENT FALLS:  Life alert? No  Use of a cane, walker or w/c? No  Grab bars in the bathroom? No  Shower chair or bench in shower? No  Elevated toilet seat or a handicapped toilet? No   DME ORDERS:  DME order needed?  No   TIMED UP AND GO:  Was the test performed? Yes .  Length of time to ambulate 10 feet: 5 sec.   GAIT:  Appearance of gait: Gait stead-fast and without the use of an assistive  device.   Education: Fall risk prevention has been discussed.  Intervention(s) required? No    Cognitive Function:     6CIT Screen 05/25/2018  What Year? 0 points  What month? 0 points  What time? 0 points  Count back from 20 0 points  Months in reverse 0 points  Repeat phrase 2 points  Total Score 2    Screening Tests Health Maintenance  Topic Date Due  . PNA vac Low Risk Adult (2 of 2 - PPSV23) 12/25/2017  . HEMOGLOBIN A1C  08/03/2018  . INFLUENZA VACCINE  08/05/2018  . URINE MICROALBUMIN  10/28/2018  . OPHTHALMOLOGY EXAM  01/28/2019  . FOOT EXAM  02/03/2019  . TETANUS/TDAP  07/20/2020  . COLONOSCOPY  12/26/2023  . Hepatitis C Screening  Completed  . HIV Screening  Completed    Qualifies for Shingles Vaccine?  Yes  Zostavax completed per patient several years ago. Due for Shingrix. Education has been provided regarding the importance of this vaccine. Pt has been advised to call insurance company to determine out of pocket expense. Advised may also receive vaccine at local pharmacy or Health Dept. Verbalized acceptance and understanding.  Tdap: Up to date  Flu Vaccine: Up to date  Pneumococcal Vaccine: Up to date  Cancer Screenings:  Colorectal Screening: Completed 12/25/13. Repeat every 10 years.   Lung Cancer Screening: (Low Dose CT Chest recommended if Age 29-80 years, 30 pack-year currently smoking OR have quit w/in 15years.) does not qualify.   Additional Screening:  Hepatitis C Screening: does qualify; Completed 06/22/11  Vision Screening: Recommended annual ophthalmology exams for early detection of glaucoma and other disorders of the eye. Is the patient up to date with their annual eye exam?  Yes  Who is the provider or what is the name of the office in which the pt attends annual eye exams? [place]  Dental Screening: Recommended annual dental exams for proper oral  hygiene  Community Resource Referral:  CRR required this visit?  No       Plan:    I have personally reviewed and addressed the Medicare Annual Wellness questionnaire and have noted the following in the patient's chart:  A. Medical and social history B. Use of alcohol, tobacco or illicit drugs  C. Current medications and supplements D. Functional ability and status E.  Nutritional status F.  Physical activity G. Advance directives H. List of other physicians I.  Hospitalizations, surgeries, and ER visits in previous 12 months J.  [place] such as hearing and vision if needed, cognitive and depression L. Referrals and appointments   In addition, I have reviewed and discussed with patient certain preventive protocols, quality metrics, and best practice recommendations. A written personalized care plan for preventive services as well as general preventive health recommendations were provided to patient.   Signed,  Clemetine Marker, LPN Nurse Health Advisor   Nurse Notes: pt had recent prostate biopsy and scheduled for full body scan tomorrow. He is feeling well and has a positive outlook. Pt appreciative of wellness visit today.

## 2018-05-26 ENCOUNTER — Encounter
Admission: RE | Admit: 2018-05-26 | Discharge: 2018-05-26 | Disposition: A | Discharge status: Home / Self Care | Payer: PPO | Source: Ambulatory Visit | Attending: Urology | Admitting: Urology

## 2018-05-26 ENCOUNTER — Other Ambulatory Visit: Payer: Self-pay

## 2018-05-26 ENCOUNTER — Ambulatory Visit
Admission: RE | Admit: 2018-05-26 | Discharge: 2018-05-26 | Disposition: A | Discharge status: Home / Self Care | Payer: PPO | Source: Ambulatory Visit | Attending: Urology | Admitting: Urology

## 2018-05-26 ENCOUNTER — Ambulatory Visit: Admission: RE | Admit: 2018-05-26 | Payer: PPO | Source: Ambulatory Visit

## 2018-05-26 DIAGNOSIS — Z8546 Personal history of malignant neoplasm of prostate: Secondary | ICD-10-CM | POA: Diagnosis not present

## 2018-05-26 DIAGNOSIS — C61 Malignant neoplasm of prostate: Secondary | ICD-10-CM | POA: Insufficient documentation

## 2018-05-26 LAB — POCT I-STAT CREATININE: Creatinine, Ser: 1.2 mg/dL (ref 0.61–1.24)

## 2018-05-26 MED ORDER — IOHEXOL 300 MG/ML  SOLN
100.0000 mL | Freq: Once | INTRAMUSCULAR | Status: AC | PRN
  Administered 2018-05-26: 100 mL via INTRAVENOUS

## 2018-05-26 MED ORDER — TECHNETIUM TC 99M MEDRONATE IV KIT
23.0300 | PACK | Freq: Once | INTRAVENOUS | Status: AC | PRN
  Administered 2018-05-26: 23.03 via INTRAVENOUS

## 2018-06-01 ENCOUNTER — Other Ambulatory Visit: Payer: Self-pay

## 2018-06-01 ENCOUNTER — Encounter: Payer: Self-pay | Admitting: Urology

## 2018-06-01 ENCOUNTER — Ambulatory Visit (INDEPENDENT_AMBULATORY_CARE_PROVIDER_SITE_OTHER): Payer: PPO | Admitting: Urology

## 2018-06-01 VITALS — BP 153/75 | HR 64 | Ht 69.5 in | Wt 229.0 lb

## 2018-06-01 DIAGNOSIS — C61 Malignant neoplasm of prostate: Secondary | ICD-10-CM | POA: Diagnosis not present

## 2018-06-01 NOTE — Progress Notes (Signed)
06/01/2018 2:13 PM   John Benson 1950/06/16 [id number]  Reason for visit: Follow up prostate biopsy results and staging imaging  HPI: John Benson is a healthy 68 year old African-American male with no prior abdominal surgeries who presents today to discuss his prostate biopsy results.  He underwent prostate biopsy on 05/18/2018 for a significantly elevated PSA of 20.  This demonstrated a 35 g prostate, and pathology showed high risk prostate adenocarcinoma with 8/12 cores involved.  Pathology report attached below.  He denies any urinary symptoms at baseline and voids with a strong stream.  He has mild erectile dysfunction at baseline.      He has already undergone staging imaging with bone scan and CT which were both negative for metastatic disease.  We had a lengthy conversation today about the patient's new diagnosis of prostate cancer.  We reviewed the risk classifications per the AUA guidelines including very low risk, low risk, intermediate risk, and high risk disease, and the need for additional staging imaging with CT and bone scan in patients with unfavorable intermediate risk and high risk disease.  I explained that his life expectancy, clinical stage, Gleason score, PSA, and other Benson-morbidities influence treatment strategies.  We discussed the roles of active surveillance, radiation therapy, surgical therapy with robotic prostatectomy, and hormone therapy with androgen deprivation.  We discussed that patients urinary symptoms also impact treatment strategy, as patients with severe lower urinary tract symptoms may have significant worsening or even develop urinary retention after undergoing radiation.  In regards to surgery, we discussed robotic prostatectomy + lymphadenectomy at length.  The procedure takes 3 to 4 hours, and patient's typically discharge home on post-op day #1.  A Foley catheter is left in place for 7 to 10 days to allow for healing of the vesicourethral  anastomosis.  There is a small risk of bleeding, infection, damage to surrounding structures or bowel, hernia, DVT/PE, or serious cardiac or pulmonary complications.  We discussed at length post-op side effects including erectile dysfunction, and the importance of pre-operative erectile function on long-term outcomes.  Even with a nerve sparing approach, there is an approximately 25% rate of permanent erectile dysfunction.  We also discussed postop urinary incontinence at length.  We expect patients to have stress incontinence post-operatively that will improve over period of weeks to months.  Less than 10% of men will require a pad at 1 year after surgery.  Patients will need to avoid heavy lifting and strenuous activity for 3 to 4 weeks, but most men return to their baseline activity status by 6 weeks.  In summary, John Benson is a 68 y.o. man with newly diagnosed localized high risk prostate cancer.  With his high risk disease I would plan for wide surgical margins and he would not be a candidate for a nerve sparing approach.  We discussed the advantages of surgery including addressing lymph nodes and being able to receive radiation in the future if he had recurrence of his disease.  We discussed that overall survival is similar between surgery and radiation/ADT.  He would like to meet with radiation oncology before making a final decision.  He will reach out to [place] with how he would like to proceed.  I do feel he would be a good candidate for robotic prostatectomy and lymphadenectomy.  A total of 25 minutes were spent face-to-face with the patient, greater than 50% was spent in patient education, counseling, and coordination of care regarding new diagnosis of prostate cancer and treatment  options.  John Benson, [facility] [address], [place], [place] [address] [phone]

## 2018-06-01 NOTE — Patient Instructions (Signed)
Radical Prostatectomy  Radical prostatectomy is a procedure to remove the entire prostate gland and the seminal vesicles.This procedure is done to treat prostate cancer that has not spread to other parts of the body (metastasized). The goal of the procedure is to remove all cancer cells and prevent prostate cancer from metastasizing. During a radical prostatectomy, the lymph glands may also be removed from the pelvis and tested for cancer cells. Lymph glands are part of the body's disease-fighting system (immune system). The pelvic lymph glands are the first place that may be affected by prostate cancer metastasis. Tell a health care provider about:  Any allergies you have.  All medicines you are taking, including vitamins, herbs, eye drops, creams, and over-the-counter medicines.  Any problems you or family members have had with anesthetic medicines.  Any blood disorders you have.  Any surgeries you have had.  Any medical conditions you have.  Any prostate infections you have had. What are the risks? Generally, this is a safe procedure. However, problems may occur, including:  Infection.  Bleeding.  Allergic reactions to medicines.  Damage to other structures or organs, such as: ? The tubes that drain urine from the kidneys to the bladder (ureters). ? The bladder. ? The urethra. ? The large or small intestines. ? The rectum.  Inability to control when you urinate (incontinence). Usually, this is temporary.  Inability to get or sustain an erection that allows you to have sexual intercourse (erectile dysfunction).  Blockage (obstruction) of the large or small intestines.  Narrowing or scarring of the urethra (stricture), which may block the flow of urine.  Blood clots in the legs.  Formation ofa sac (cyst) in the pelvis that is filled with fluid from the lymph glands (lymphocele). What happens before the procedure?  Ask your health care provider about: ? Changing or  stopping your regular medicines. This is especially important if you are taking diabetes medicines or blood thinners. ? Taking medicines such as aspirin and ibuprofen. These medicines can thin your blood. Do not take these medicines before your procedure if your health care provider instructs you not to.  Follow instructions from your health care provider about eating or drinking restrictions.  Ask your health care provider how your surgical site will be marked or identified.  You may be given antibiotic medicine to help prevent infection.  You may have an exam or testing, such as: ? MRI. ? CT scan. ? Bone scan.  You may have a blood sample taken.  Plan to have someone take you home after the procedure. What happens during the procedure?  To reduce your risk of infection: ? Your health care team will wash or sanitize their hands. ? Your skin will be washed with soap. ? Hair will be removed from your surgical area.  An IV tube will be inserted into one of your veins.  You will be given one or more of the following: ? A medicine to help you relax (sedative). ? A medicine to make you fall asleep (general anesthetic). ? A medicine that is injected into your spine to numb the area below and slightly above the injection site (spinal anesthetic).  A thin, flexible tube (catheter) will be inserted through your urethra and into your bladder to drain your urine.  This procedure may be performed using one of the following techniques: ? Radical retropubic prostatectomy. During this type of procedure, an incision will be made in your abdomen, starting right below your belly button and ending  at your pubic bone. ? Laparoscopic radical prostatectomy. This is a type of minimally invasive surgery. Instead of one large incision, 4-5 small incisions will be made in your abdomen. A thin, lighted tube with a tiny camera on the end (laparoscope) and other surgical instruments will be put through your  incisions to perform the procedure. ? Robot-assisted laparoscopic prostatectomy. This is a type of laparoscopic procedure in which surgical instruments will be controlled with the help of a robotic arm. ? Radical perineal prostatectomy. During this type of procedure, an incision will be made in the skin between your anus and the base of your scrotum (perineum). If your lymph glands will be removed, a second incision will be made in your abdomen.  Your prostate and seminal vesicles will be removed. When this happens, your urethra will be cut and separated from your bladder.  Your pelvic lymph glands may be removed.  Your urethra will be reconnected to your bladder. This will be done so that your catheter stays in place inside of your urethra.  A small tube (drain) may be placed in your incision to help drain fluid from your surgical site.  Your incision will be closed with stitches (sutures), skin glue, or adhesive strips. Medicine may be applied to your incision.  A bandage (dressing) will be placed over your incision. The procedure may vary among health care providers and hospitals. What happens after the procedure?  Your blood pressure, heart rate, breathing rate, and blood oxygen level will be monitored often until the medicines you were given have worn off.  You may continue to receive fluids and medicines through an IV tube.  You will have some pain. Pain medicines will be available to help you.  You will have a catheter draining urine from your bladder.  You may have fluid coming from a drain in your incision.  You may have to wear compression stockings. These stockings help to prevent blood clots and reduce swelling in your legs.  You will be encouraged to move around as much as possible. This information is not intended to replace advice given to you by your health care provider. Make sure you discuss any questions you have with your health care provider. Document Released:  03/31/2005 Document Revised: 05/29/2015 Document Reviewed: 11/11/2014 Elsevier Interactive Patient Education  2019 [employer].

## 2018-06-06 ENCOUNTER — Other Ambulatory Visit: Payer: Self-pay

## 2018-06-07 ENCOUNTER — Other Ambulatory Visit: Payer: Self-pay

## 2018-06-07 ENCOUNTER — Ambulatory Visit
Admission: RE | Admit: 2018-06-07 | Discharge: 2018-06-07 | Disposition: A | Discharge status: Home / Self Care | Payer: PPO | Source: Ambulatory Visit | Attending: Radiation Oncology | Admitting: Radiation Oncology

## 2018-06-07 ENCOUNTER — Encounter: Payer: Self-pay | Admitting: Radiation Oncology

## 2018-06-07 VITALS — BP 164/80 | HR 64 | Temp 97.9°F | Resp 18 | Wt 229.2 lb

## 2018-06-07 DIAGNOSIS — N182 Chronic kidney disease, stage 2 (mild): Secondary | ICD-10-CM | POA: Diagnosis not present

## 2018-06-07 DIAGNOSIS — Z7982 Long term (current) use of aspirin: Secondary | ICD-10-CM | POA: Diagnosis not present

## 2018-06-07 DIAGNOSIS — E1122 Type 2 diabetes mellitus with diabetic chronic kidney disease: Secondary | ICD-10-CM | POA: Diagnosis not present

## 2018-06-07 DIAGNOSIS — E785 Hyperlipidemia, unspecified: Secondary | ICD-10-CM | POA: Diagnosis not present

## 2018-06-07 DIAGNOSIS — C61 Malignant neoplasm of prostate: Secondary | ICD-10-CM

## 2018-06-07 DIAGNOSIS — Z809 Family history of malignant neoplasm, unspecified: Secondary | ICD-10-CM | POA: Insufficient documentation

## 2018-06-07 DIAGNOSIS — E669 Obesity, unspecified: Secondary | ICD-10-CM | POA: Diagnosis not present

## 2018-06-07 DIAGNOSIS — I10 Essential (primary) hypertension: Secondary | ICD-10-CM | POA: Diagnosis not present

## 2018-06-07 DIAGNOSIS — I129 Hypertensive chronic kidney disease with stage 1 through stage 4 chronic kidney disease, or unspecified chronic kidney disease: Secondary | ICD-10-CM | POA: Insufficient documentation

## 2018-06-07 DIAGNOSIS — R972 Elevated prostate specific antigen [PSA]: Secondary | ICD-10-CM | POA: Insufficient documentation

## 2018-06-07 DIAGNOSIS — Z87442 Personal history of urinary calculi: Secondary | ICD-10-CM | POA: Diagnosis not present

## 2018-06-07 DIAGNOSIS — E291 Testicular hypofunction: Secondary | ICD-10-CM | POA: Insufficient documentation

## 2018-06-07 NOTE — Consult Note (Signed)
NEW PATIENT EVALUATION  Name: John Benson.  MRN: [id number]  Date:   06/07/2018     DOB: 09-21-1950   This 68 y.o. male patient presents to the clinic for initial evaluation of stage IIb (T1 cN0 M0) Gleason 8 (5+3) adenocarcinoma the prostate presenting with a PSA of 20.  REFERRING PHYSICIAN: Billey Co, MD  CHIEF COMPLAINT:  Chief Complaint  Patient presents with  . Prostate Cancer    DIAGNOSIS: The encounter diagnosis was Malignant neoplasm of prostate ([place]).   PREVIOUS INVESTIGATIONS:  Pathology report reviewed CT scans and bone scan reviewed Clinical notes reviewed  HPI: Patient is a 68 year old male who is been having yearly PSAs evaluated.  His most recent PSA back in May was significantly elevated at 20.  He underwent transrectal ultrasound-guided biopsy for 35 g prostate showing 8 out of 12 cores positive for adenocarcinoma mostly Gleason 8 (4+4) although 2 cores were positive for Gleason 8 (5+3).  Patient has very little urinary symptoms no specific urgency frequency or nocturia.  He did have a bone scan which was negative for malignancy.  He did had a CT scan of the abdomen pelvis showing left-sided nonspecific hyperenhancement which could represent primary prostatic carcinoma.  No evidence to suggest lymphadenopathy in the pelvis or extracapsular extension.  He has been seen by urology with recommendation for robotic robotic prostatectomy.  Patient is seen today not really interested in surgery would like radiation oncology evaluation.  PLANNED TREATMENT REGIMEN: Androgen deprivation therapy plus external beam radiation therapy to prostate and pelvic nodes plus I-125 interstitial implant for boost  PAST MEDICAL HISTORY:  has a past medical history of Benign essential HTN, Calculus of kidney, Carpal tunnel syndrome, Chronic kidney disease, stage II (mild), Diabetes mellitus without complication ([place]), History of kidney stones, HTN (hypertension), Hyperlipidemia,  Hypogonadism in male, and Obesity.    PAST SURGICAL HISTORY:  Past Surgical History:  Procedure Laterality Date  . LITHOTRIPSY      FAMILY HISTORY: family history includes Cancer in his mother; Dementia in his father; Diabetes in his brother and mother; Early death in his brother and sister; Hypertension in his father; Kidney disease in his sister; Pancreatic disease in his brother; Vision loss in his mother.  SOCIAL HISTORY:  reports that he has never smoked. He has never used smokeless tobacco. He reports that he does not drink alcohol or use drugs.  ALLERGIES: Testosterone  MEDICATIONS:  Current Outpatient Medications  Medication Sig Dispense Refill  . aspirin (ASPIRIN ADULT LOW STRENGTH) 81 MG EC tablet Take 81 mg by mouth daily.     . Multiple Vitamin (MULTIVITAMIN) tablet Take 1 tablet by mouth daily.    Ernestine Conrad 3-6-9 Fatty Acids (OMEGA DHA PO) Take 1 capsule by mouth daily.     No current facility-administered medications for this encounter.     ECOG PERFORMANCE STATUS:  0 - Asymptomatic  REVIEW OF SYSTEMS: Patient denies any weight loss, fatigue, weakness, fever, chills or night sweats. Patient denies any loss of vision, blurred vision. Patient denies any ringing  of the ears or hearing loss. No irregular heartbeat. Patient denies heart murmur or history of fainting. Patient denies any chest pain or pain radiating to her upper extremities. Patient denies any shortness of breath, difficulty breathing at night, cough or hemoptysis. Patient denies any swelling in the lower legs. Patient denies any nausea vomiting, vomiting of blood, or coffee ground material in the vomitus. Patient denies any stomach pain. Patient states has had  normal bowel movements no significant constipation or diarrhea. Patient denies any dysuria, hematuria or significant nocturia. Patient denies any problems walking, swelling in the joints or loss of balance. Patient denies any skin changes, loss of hair or loss  of weight. Patient denies any excessive worrying or anxiety or significant depression. Patient denies any problems with insomnia. Patient denies excessive thirst, polyuria, polydipsia. Patient denies any swollen glands, patient denies easy bruising or easy bleeding. Patient denies any recent infections, allergies or URI. Patient "s visual fields have not changed significantly in recent time.   PHYSICAL EXAM: BP (!) 164/80   Pulse 64   Temp 97.9 F (36.6 C)   Resp 18   Wt 229 lb 2.7 oz (103.9 kg)   BMI 33.36 kg/m  On rectal exam rectal sphincter tone is good prostate is smooth without evidence of nodularity or mass sulcus is preserved bilaterally.  Seminal vesicle regions appear uninvolved.  No other rectal abnormalities identified.  Well-developed well-nourished patient in NAD. HEENT reveals PERLA, EOMI, discs not visualized.  Oral cavity is clear. No oral mucosal lesions are identified. Neck is clear without evidence of cervical or supraclavicular adenopathy. Lungs are clear to A&P. Cardiac examination is essentially unremarkable with regular rate and rhythm without murmur rub or thrill. Abdomen is benign with no organomegaly or masses noted. Motor sensory and DTR levels are equal and symmetric in the upper and lower extremities. Cranial nerves II through XII are grossly intact. Proprioception is intact. No peripheral adenopathy or edema is identified. No motor or sensory levels are noted. Crude visual fields are within normal range.  LABORATORY DATA: Pathology report reviewed    RADIOLOGY RESULTS: CT scans and bone scan both reviewed compatible with above-stated findings   IMPRESSION: Stage IIb Gleason 8 adenocarcinoma the prostate presenting with a PSA of 64 and 68 year old male  PLAN: At this time I have gone over the [facility] with the patient.  According to his prediction tools he is a 14% chance of organ confined disease with a 40% chance of lymph node involvement  as well as a 40% chance of seminal vesicle involvement.  I still believe patient's number would choice based on his age overall good health would be robotic prostatectomy and salvage radiation therapy should it be indicated.  Patient is really not interested in surgery.  Have gone over my recommendations as far as radiation which would include androgen deprivation therapy for up to 2 years external beam radiation therapy using IMRT treatment planning and delivery to his prostate and pelvic nodes to deliver 5000 cGy to both structures.  As well as an I-125 interstitial implant for boost.  Risks and benefits of treatment including increased lower urinary tract symptoms diarrhea fatigue alteration of blood count skin reaction and radiation safety precautions along with his implant all were discussed in detail with the patient.  He seems to comprehend my treatment plan well.  Patient would like to proceed with treatment planning and I have arranged that.  Patient is well aware that there is no salvage after failure of radiation therapy although with robotic prostatectomy that would be an option.  He is still adamant about not having surgery after what he has read and today's discussion.  I would like to take this opportunity to thank you for allowing me to participate in the care of your patient.Noreene Filbert, MD

## 2018-06-08 ENCOUNTER — Ambulatory Visit: Payer: PPO

## 2018-06-12 ENCOUNTER — Telehealth: Payer: Self-pay | Admitting: Urology

## 2018-06-12 NOTE — Telephone Encounter (Signed)
LM TO CB TO SCHEDULE HIS LUPRON INJECTION WITH SNISNKY OR SHANNON ASAP MICHELLE  NO PA REQUIRED FOR LUPRON

## 2018-06-13 NOTE — Telephone Encounter (Signed)
I called the patient to schedule his Lupron injection and he stated that he does not want to proceed with Lupron injections at this time.    Sharyn Lull

## 2018-06-15 ENCOUNTER — Ambulatory Visit: Payer: PPO | Admitting: Podiatry

## 2018-06-15 ENCOUNTER — Other Ambulatory Visit: Payer: Self-pay

## 2018-06-15 ENCOUNTER — Encounter: Payer: Self-pay | Admitting: Podiatry

## 2018-06-15 DIAGNOSIS — M79675 Pain in left toe(s): Secondary | ICD-10-CM | POA: Insufficient documentation

## 2018-06-15 DIAGNOSIS — M79674 Pain in right toe(s): Secondary | ICD-10-CM

## 2018-06-15 DIAGNOSIS — B351 Tinea unguium: Secondary | ICD-10-CM

## 2018-06-15 DIAGNOSIS — E119 Type 2 diabetes mellitus without complications: Secondary | ICD-10-CM

## 2018-06-15 NOTE — Progress Notes (Signed)
Complaint:  Visit Type: Patient returns to my office for continued preventative foot care services. Complaint: Patient states" my nails have grown long and thick and become painful to walk and wear shoes" Patient has been diagnosed with DM with no foot complications. The patient presents for preventative foot care services. No changes to ROS  Podiatric Exam: Vascular: dorsalis pedis and posterior tibial pulses are palpable bilateral. Capillary return is immediate. Temperature gradient is WNL. Skin turgor WNL  Sensorium: Diminished  Semmes Weinstein monofilament test. Normal tactile sensation bilaterally. Nail Exam: Pt has thick disfigured discolored nails with subungual debris noted bilateral entire nail hallux through fifth toenails Ulcer Exam: There is no evidence of ulcer or pre-ulcerative changes or infection. Orthopedic Exam: Muscle tone and strength are WNL. No limitations in general ROM. No crepitus or effusions noted. Foot type and digits show no abnormalities. Bony prominences are unremarkable. Skin: No Porokeratosis. No infection or ulcers  Diagnosis:  Onychomycosis, , Pain in right toe, pain in left toes  Treatment & Plan Procedures and Treatment: Consent by patient was obtained for treatment procedures.   Debridement of mycotic and hypertrophic toenails, 1 through 5 bilateral and clearing of subungual debris. No ulceration, no infection noted.  Return Visit-Office Procedure: Patient instructed to return to the office for a follow up visit 3 months for continued evaluation and treatment.    Neeley Sedivy DPM 

## 2018-06-29 ENCOUNTER — Other Ambulatory Visit: Payer: Self-pay | Admitting: Radiology

## 2018-06-29 ENCOUNTER — Other Ambulatory Visit: Payer: Self-pay

## 2018-06-29 ENCOUNTER — Encounter: Payer: Self-pay | Admitting: Urology

## 2018-06-29 ENCOUNTER — Ambulatory Visit (INDEPENDENT_AMBULATORY_CARE_PROVIDER_SITE_OTHER): Payer: PPO | Admitting: Urology

## 2018-06-29 ENCOUNTER — Telehealth: Payer: Self-pay | Admitting: Radiology

## 2018-06-29 VITALS — BP 167/80 | HR 60 | Ht 69.5 in | Wt 223.0 lb

## 2018-06-29 DIAGNOSIS — C61 Malignant neoplasm of prostate: Secondary | ICD-10-CM

## 2018-06-29 NOTE — Progress Notes (Signed)
   06/29/2018 2:15 PM   John Benson. 1950/10/14 [id number]  Reason for visit: Re-discuss high risk prostate cancer  HPI: I saw John Benson back in urology clinic to discuss treatment options for high risk prostate cancer.  To briefly summarize, he is an otherwise healthy 67 year old African-American male who presented with a PSA of 20, and was found to have 8/12 cores positive on prostate biopsy for high risk prostate cancer.  Please see my note from 06/01/2018 for full details.  He has moderate erectile dysfunction at baseline.  He denies any prior abdominal surgeries.  He recently saw Dr. Baruch Gouty for options from a radiation oncology perspective, which would include 2 years of ADT with external beam radiation as well as brachytherapy boost.  We had a lengthy conversation again today about the patient's diagnosis of high risk prostate cancer.  We discussed the roles of active surveillance, radiation therapy, surgical therapy with robotic prostatectomy, and hormone therapy with androgen deprivation.  We discussed that patients urinary symptoms also impact treatment strategy, as patients with severe lower urinary tract symptoms may have significant worsening or even develop urinary retention after undergoing radiation.  In regards to surgery, we discussed robotic prostatectomy +/- lymphadenectomy at length.  The procedure takes 3 to 4 hours, and patient's typically discharge home on post-op day #1.  A Foley catheter is left in place for 7 to 10 days to allow for healing of the vesicourethral anastomosis.  There is a small risk of bleeding, infection, damage to surrounding structures or bowel, hernia, DVT/PE, or serious cardiac or pulmonary complications.  We discussed at length post-op side effects including erectile dysfunction, and the importance of pre-operative erectile function on long-term outcomes.  We discussed the need for wide surgical margins with his high risk disease, and high  likelihood of postoperative erectile dysfunction.  We also discussed post-op urinary incontinence at length.  We expect patients to have stress incontinence post-operatively that will improve over period of weeks to months.  Less than 10% of men will require a pad at 1 year after surgery.  Patients will need to avoid heavy lifting and strenuous activity for 3 to 4 weeks, but most men return to their baseline activity status by 6 weeks.  In summary, John Benson. is a 67 y.o. man with newly diagnosed high risk prostate cancer, with negative staging imaging. He would like to pursue robotic prostatectomy with bilateral pelvic lymph node dissection.  A total of 15 minutes were spent face-to-face with the patient, greater than 50% was spent in patient education, counseling, and coordination of care regarding high risk prostate cancer and risks and benefits of surgery.   Billey Co, [facility] [address] [place], [place] [address] [phone]

## 2018-06-29 NOTE — Patient Instructions (Signed)
Robot-Assisted Laparoscopic Prostatectomy  Robot-assisted laparoscopic prostatectomy is a minimally invasive procedure to remove the entire prostate gland and the seminal vesicles, which are near the bladder and the prostate. This procedure is done to treat prostate cancer that has not spread to other parts of the body (has not metastasized). The goal of the procedure is to remove all cancer cells and to prevent prostate cancer from metastasizing. During your robot-assisted laparoscopic prostatectomy, your surgeon will use a thin, lighted tube with a tiny camera on the end (laparoscope). The laparoscope will allow your surgeon to do the surgery with several small incisions in the abdomen instead of a large incision. Your surgeon will also use a robotic arm during the procedure that will be operated through a computer. During your procedure, the lymph glands in your pelvis may be removed. Lymph glands are part of your body's disease-fighting system (immune system). The lymph glands in the pelvis may be the first place that is affected by the spreading (metastasis) of prostate cancer. If your pelvic lymph glands are removed, tissue from the glands will be tested for cancer cells. Tell a health care provider about:  Any allergies you have.  All medicines you are taking, including vitamins, herbs, eye drops, creams, and over-the-counter medicines.  Any problems you or family members have had with anesthetic medicines.  Any blood disorders you have.  Any surgeries you have had.  Any medical conditions you have.  Any prostate infections you have had. What are the risks? Generally, this is a safe procedure. However, problems may occur, including:  Infection.  Bleeding.  Allergic reactions to medicines.  Inability to control when you urinate (incontinence). This is usually a temporary effect of this procedure.  Blockage (obstruction) of the large or small intestines.  Narrowing or scarring  of the urethra (stricture), which may block the flow of urine.  Inability to get or sustain an erection (erectile dysfunction).  Dry ejaculation. This is when no semen is released during orgasm.  Blood clots in the legs.  The formation of a sac (cyst) in the pelvis that is filled with fluid from the lymph glands (lymphocele). There is also a risk of damage to other structures or organs, such as:  The tubes that drain urine from the kidneys to the bladder (ureters).  The bladder.  The urethra.  The large or small intestines.  The rectum.  Vascular arteries or veins.  Nerve tissue. What happens before the procedure? Staying hydrated Follow instructions from your health care provider about hydration, which may include:  Up to 2 hours before the procedure - you may continue to drink clear liquids, such as water, clear fruit juice, black coffee, and plain tea. Eating and drinking restrictions Follow instructions from your health care provider about eating and drinking, which may include:  8 hours before the procedure - stop eating heavy meals or foods such as meat, fried foods, or fatty foods.  6 hours before the procedure - stop eating light meals or foods, such as toast or cereal.  6 hours before the procedure - stop drinking milk or drinks that contain milk.  2 hours before the procedure - stop drinking clear liquids. Medicines  Ask your health care provider about: ? Changing or stopping your regular medicines. This is especially important if you are taking diabetes medicines or blood thinners. ? Taking medicines such as aspirin and ibuprofen. These medicines can thin your blood. Do not take these medicines before your procedure if your health  care provider instructs you not to.  You may be given antibiotic medicine to help prevent infection. General instructions  Ask your health care provider how your surgical site will be marked or identified.  You may have an exam  or testing.  You may have additional tests, such as a CT scan or MRI.  You may have a blood or urine sample taken. What happens during the procedure?  To reduce your risk of infection: ? Your health care team will wash or sanitize their hands. ? Your skin will be washed with soap. ? Hair will be removed from your surgical area.  An IV tube will be inserted into one of your veins.  You will be given one or more of the following: ? A medicine to help you relax (sedative). ? A medicine to make you fall asleep (general anesthetic). ? A medicine that is injected into your spine to numb the area below and slightly above the injection site (spinal anesthetic).  A thin, flexible tube (catheter) will be inserted through your urethra and into your bladder. The catheter will drain urine from your bladder during the procedure and while you heal.  Four or five small incisions will be made in your abdomen.  The laparoscope and other surgical instruments will be put through the incisions. Your surgeon will use the laparoscope and a robotic arm to help control the surgical instruments.  Your urethra will be cut and separated from your bladder, and your prostate and seminal vesicles will be removed.  Your pelvic lymph glands may also be removed for testing.  Your urethra will be reconnected to your bladder. This will be done so your catheter is still in place inside of your urethra.  A small tube (drain) may be placed in one or more of your incisions to help drain extra fluid from your surgical site.  Your incisions will be closed with stitches (sutures), skin glue, or adhesive strips.  Medicine may be applied to your incisions.  Bandages (dressings) will be placed over your incisions. What happens after the procedure?  Your blood pressure, heart rate, breathing rate, and blood oxygen level will be monitored until the medicines you were given have worn off.  You may continue to receive  fluids and medicines through an IV tube.  You will have some pain. Pain medicines will be available to help you.  You will have a catheter to drain urine from your bladder.  You may have fluid coming from a drain in your incision.  You may have to wear compression stockings. These stockings help to prevent blood clots and reduce swelling in your legs.  You will be encouraged to move around as much as possible. This information is not intended to replace advice given to you by your health care provider. Make sure you discuss any questions you have with your health care provider. Document Released: 09/15/2011 Document Revised: 10/03/2015 Document Reviewed: 10/03/2015 Elsevier Interactive Patient Education  2019 [place] Laparoscopic Prostatectomy, Care After This sheet gives you information about how to care for yourself after your procedure. Your health care provider may also give you more specific instructions. If you have problems or questions, contact your health care provider. What can I expect after the procedure? After the procedure, it is common to have:  Mild pain in your lower abdomen.  Blood in your urine for up to 3 weeks.  A need to urinate more often than usual (bladder spasms). This may last  for up to 2 weeks. After your catheter is removed, it is common to have:  A burning sensation when you urinate. This may last for up to 2 weeks after your catheter is removed.  Difficulty controlling when you urinate (incontinence). You may leak urine occasionally. Your ability to control when you urinate will improve as you continue to heal. Follow these instructions at home: Medicines  Take over-the-counter and prescription medicines only as told by your health care provider.  Do not drive or use heavy machinery while taking prescription pain medicine.  If you were prescribed an antibiotic medicine, take it as told by your health care provider. Do not  stop taking the antibiotic even if you start to feel better.  If you were given a stool softener, use it as told by your health care provider. Incision care and drain care  Follow instructions from your health care provider about how to take care of your incisions. Make sure you: ? Wash your hands with soap and water before you change your bandage (dressing). If soap and water are not available, use hand sanitizer. ? Change your dressing as told by your health care provider. ? Leave stitches (sutures), skin glue, or adhesive strips in place. These skin closures may need to stay in place for 2 weeks or longer. If adhesive strip edges start to loosen and curl up, you may trim the loose edges. Do not remove adhesive strips completely unless your health care provider tells you to do that.  If you have a drain, follow instructions from your health care provider about how to care for it. Do not remove the drain tube or any dressings around the tube opening unless your health care provider approves.  Check your incision sites and drain area every day for signs of infection. Check for: ? More redness, swelling, or pain. ? More fluid or blood. ? Warmth. ? Pus or a bad smell. Catheter care   Always wash your hands with soap and water before and after touching your penis, your catheter, and your drainage bag. If soap and water are not available, use hand sanitizer.  Empty your drainage bag every 3 hours during the day, or as often as told by your health care provider.  Clean the tip of your penis with soap and water two times a day, or as often as told by your health care provider.  If you were given ointment to apply to the tip of your penis, follow instructions from your health care provider about how to do this.  You will need to visit your health care provider to have your catheter removed. Your catheter may remain in place for up to 3 weeks after your procedure. Activity  Return to your  normal activities, including driving, as told by your health care provider. Ask your health care provider what activities are safe for you.  Do not lift anything that is heavier than 10 lb (4.5 kg) until your health care provider says that you can do this.  Avoid intense physical activity for as long as told by your health care provider.  Walk at least once a day. As you start to feel better, you may start to exercise more. Ask your health care provider what exercises are appropriate for you. Eating and drinking  Follow instructions from your health care provider about eating or drinking restrictions.  Drink enough fluid to keep your urine clear or pale yellow. General instructions  Do not take baths, swim, or  use a hot tub until your health care provider approves.  If your pelvic lymph nodes were removed for testing, it is up to you to get your test results. Ask your health care provider, or the department performing the test, when your results will be ready.  Wear compression stockings as told by your health care provider. These stockings help to prevent blood clots and reduce swelling in your legs.  Follow instructions from your health care provider about when it is safe for you to engage in sexual activity.  Do not strain to have a bowel movement.  Do not use any products that contain nicotine or tobacco, such as cigarettes and e-cigarettes. If you need help quitting, ask your health care provider.  Keep all follow-up visits as told by your health care provider. This is important. Contact a health care provider if:  You have problems with your catheter.  You have bladder spasms more than 3 or 4 times a day.  You become constipated. Symptoms of constipation may include: ? Having fewer than three bowel movements a week. ? Difficulty having a bowel movement. ? Having stools that are hard, dry, or larger than normal. Get help right away if:  You have more redness, swelling, or  pain around your incisions, drain area, or the area where the catheter was inserted (catheter insertion site).  You have more fluid or blood coming from your incision area, drain area, or catheter insertion site.  Your incision area, drain area, or catheter insertion site feels warm to the touch.  You have pus or a bad smell coming from your incision area, drain area, or catheter insertion site.  You have a fever.  You have pain that gets worse.  Your catheter falls out.  You are unable to urinate or to pass urine through your catheter.  You have more bright red blood in your urine.  You have blood clots in your urine.  You develop: ? Chest pains. ? Shortness of breath. ? Swelling or pain in your legs. This information is not intended to replace advice given to you by your health care provider. Make sure you discuss any questions you have with your health care provider. Document Released: 09/15/2011 Document Revised: 10/03/2015 Document Reviewed: 10/03/2015 Elsevier Interactive Patient Education  2019 [employer].

## 2018-06-29 NOTE — Telephone Encounter (Signed)
Patient was given the [facility] Information form below as well as the Instructions for Pre-Admission Testing form & a map of [facility].    [address], [address] [place], [place] [address] Telephone: [phone] Fax: [phone]   Thank you for choosing [facility] for your upcoming surgery!  We are always here to assist in your urological needs.  Please read the following information with specific details for your upcoming appointments related to your surgery. Please contact Zariana Strub at [phone] Option 3 with any questions.  The Name of Your Surgery: Robot assisted laparoscopic radical prostatectomy with bilateral pelvic lymph node dissection Your Surgery Date: 08/28/2018 Your Surgeon: Nickolas Madrid  Please call Same Day Surgery at [phone] between the hours of 1pm-3pm one day prior to your surgery. They will inform you of the time to arrive at Same Day Surgery which is located on the second floor of the [facility].   Please refer to the attached letter regarding instructions for Pre-Admission Testing. You will receive a call from the [facility] office regarding your appointment with them.  The Pre-Admission Testing office is located at [place], on the first floor of the [facility] in [place] (office is to the right as you enter through the [employer] of the [facility]). Please have all medications you are currently taking and your insurance card available.  A COVID-19 test will be required prior to surgery and once test is performed you will need to remain in quarantine until the day of surgery. Patient was advised to have nothing to eat or drink after midnight the night prior to surgery except that he may have only water until 2 hours before surgery with nothing to drink within 2 hours of surgery.  The  patient states he currently takes aspirin 81mg  & was informed to hold medication for 7 days prior to surgery beginning on 08/21/2018. Patient's questions were answered and he expressed understanding of these instructions.

## 2018-07-27 ENCOUNTER — Encounter: Payer: Self-pay | Admitting: Family Medicine

## 2018-07-27 ENCOUNTER — Other Ambulatory Visit: Payer: Self-pay

## 2018-07-27 ENCOUNTER — Ambulatory Visit (INDEPENDENT_AMBULATORY_CARE_PROVIDER_SITE_OTHER): Payer: PPO | Admitting: Family Medicine

## 2018-07-27 VITALS — BP 118/80 | HR 66 | Temp 97.3°F | Resp 18 | Ht 70.0 in | Wt 234.3 lb

## 2018-07-27 DIAGNOSIS — C61 Malignant neoplasm of prostate: Secondary | ICD-10-CM

## 2018-07-27 DIAGNOSIS — E119 Type 2 diabetes mellitus without complications: Secondary | ICD-10-CM

## 2018-07-27 DIAGNOSIS — N182 Chronic kidney disease, stage 2 (mild): Secondary | ICD-10-CM | POA: Diagnosis not present

## 2018-07-27 DIAGNOSIS — E7800 Pure hypercholesterolemia, unspecified: Secondary | ICD-10-CM | POA: Diagnosis not present

## 2018-07-27 DIAGNOSIS — E669 Obesity, unspecified: Secondary | ICD-10-CM

## 2018-07-27 DIAGNOSIS — E559 Vitamin D deficiency, unspecified: Secondary | ICD-10-CM | POA: Diagnosis not present

## 2018-07-27 DIAGNOSIS — E66811 Obesity, class 1: Secondary | ICD-10-CM

## 2018-07-27 NOTE — Patient Instructions (Signed)
Try to drink 64oz water in a day.

## 2018-07-27 NOTE — Progress Notes (Signed)
Name: John Benson.   MRN: [id number]    DOB: 1950-02-03   Date:07/27/2018       Progress Note  Subjective  Chief Complaint  Chief Complaint  Patient presents with   Follow-up    6 month recheck    HPI  Prostate Cancer: Seeing Dr. Diamantina Providence, having radical prostatectomy in August.  Feeling well and is looking forward to having this surgery completed.  Reviewed recent urology notes and there is no lab work requested from PCP, but we are happy to do anything needed.   DM: Has been doing well without medication for over a year now.  Last A1C was 5 months ago and was 6.3%.  He is diet controlled, but has been going to [place] more frequently. Urine micro is UTD.  Denies polyphagia, polydipsia, or polyuria. Not on ACE or Statin.  Stopping ASA temporarily for prostatectomy.  HLD: Taking fish oil.  Due for labs today which we will order. No chest pain, shortness of breath, or myalgias.   Obesity: Diet is balanced.  Was walking daily on the treadmill, backed off due to upcoming surgery - walks for 17min at 4.6 speed every other day right now.  Encouraged ongoing exercise and low fat/carb diet.   CKD: Not taking NSAIDs except for aspirin. Not drinking enough water at this time - drinks plenty of coffee.   Vitamin D Def: Not taking supplement, has plenty of energy.  Taking multivitamin daily only.  Patient Active Problem List   Diagnosis Date Noted   Pain due to onychomycosis of toenails of both feet 06/15/2018   Obesity (BMI 30.0-34.9) 07/27/2017   Tinea pedis 04/27/2017   Chronic kidney disease, stage II (mild) 04/27/2017   Preventative health care 04/25/2017   Vitamin D deficiency 07/22/2016   Carpal tunnel syndrome 07/15/2014   Dermatophytic onychia 07/15/2014   HLD (hyperlipidemia) 07/15/2014   Male hypogonadism 07/15/2014   Spermatocele 07/15/2014   Diabetes mellitus without complication ([place]) 94/85/4627   PSA elevation 06/24/2014   Calculus of kidney  10/08/2012   Decreased libido 05/22/2008    Past Surgical History:  Procedure Laterality Date   LITHOTRIPSY      Family History  Problem Relation Age of Onset   Diabetes Mother    Cancer Mother        breast   Vision loss Mother    Hypertension Father    Dementia Father    Diabetes Brother    Early death Brother    Pancreatic disease Brother    Early death Sister    Kidney disease Sister    Kidney cancer Neg Hx    Prostate cancer Neg Hx     Social History   Socioeconomic History   Marital status: Married    Spouse name: Not on file   Number of children: Not on file   Years of education: Not on file   Highest education level: Not on file  Occupational History   Not on file  Social Needs   Financial resource strain: Not hard at all   Food insecurity    Worry: Never true    Inability: Never true   Transportation needs    Medical: No    Non-medical: No  Tobacco Use   Smoking status: Never Smoker   Smokeless tobacco: Never Used  Substance and Sexual Activity   Alcohol use: No    Alcohol/week: 0.0 standard drinks   Drug use: No   Sexual activity: Yes  Lifestyle  Physical activity    Days per week: 6 days    Minutes per session: 60 min   Stress: Not at all  Relationships   Social connections    Talks on phone: More than three times a week    Gets together: Three times a week    Attends religious service: More than 4 times per year    Active member of club or organization: No    Attends meetings of clubs or organizations: Never    Relationship status: Married   Intimate partner violence    Fear of current or ex partner: No    Emotionally abused: No    Physically abused: No    Forced sexual activity: No  Other Topics Concern   Not on file  Social History Narrative   Not on file     Current Outpatient Medications:    aspirin (ASPIRIN ADULT LOW STRENGTH) 81 MG EC tablet, Take 81 mg by mouth daily. , Disp: , Rfl:     Multiple Vitamin (MULTIVITAMIN) tablet, Take 1 tablet by mouth daily., Disp: , Rfl:    Omega 3-6-9 Fatty Acids (OMEGA DHA PO), Take 1 capsule by mouth daily., Disp: , Rfl:   Allergies  Allergen Reactions   Testosterone Itching and Rash    I personally reviewed active problem list, medication list, allergies, health maintenance, notes from last encounter, lab results with the patient/caregiver today.   ROS  Constitutional: Negative for fever or weight change.  Respiratory: Negative for cough and shortness of breath.   Cardiovascular: Negative for chest pain or palpitations.  Gastrointestinal: Negative for abdominal pain, no bowel changes.  Musculoskeletal: Negative for gait problem or joint swelling.  Skin: Negative for rash.  Neurological: Negative for dizziness or headache.  No other specific complaints in a complete review of systems (except as listed in HPI above).  Objective  Vitals:   07/27/18 1028  BP: 118/80  Pulse: 66  Resp: 18  Temp: (!) 97.3 F (36.3 C)  TempSrc: Oral  SpO2: 97%  Weight: 234 lb 4.8 oz (106.3 kg)  Height: 5\' 10"  (1.778 m)    Body mass index is 33.62 kg/m.  Physical Exam Constitutional: Patient appears well-developed and well-nourished. No distress.  HENT: Head: Normocephalic and atraumatic.  Eyes: Conjunctivae and EOM are normal. No scleral icterus.  Neck: Normal range of motion. Neck supple. No JVD present.   Cardiovascular: Normal rate, regular rhythm and normal heart sounds.  No murmur heard. No BLE edema. Pulmonary/Chest: Effort normal and breath sounds normal. No respiratory distress. Abdominal: Soft. Bowel sounds are normal, no distension. There is no tenderness. No masses. Musculoskeletal: Normal range of motion, no joint effusions. No gross deformities Neurological: Pt is alert and oriented to person, place, and time. No cranial nerve deficit. Coordination, balance, strength, speech and gait are normal.  Skin: Skin is warm and  dry. No rash noted. No erythema.  Psychiatric: Patient has a normal mood and affect. behavior is normal. Judgment and thought content normal.   No results found for this or any previous visit (from the past 72 hour(s)).  PHQ2/9: Depression screen [facility] 2/9 07/27/2018 05/25/2018 03/17/2018 02/02/2018 10/27/2017  Decreased Interest 0 0 0 0 0  Down, Depressed, Hopeless 0 0 0 0 0  PHQ - 2 Score 0 0 0 0 0  Altered sleeping 0 0 0 0 0  Tired, decreased energy 0 0 0 0 0  Change in appetite 0 0 0 0 0  Feeling bad or failure  about yourself  0 0 0 0 0  Trouble concentrating 0 0 0 0 0  Moving slowly or fidgety/restless 0 0 0 0 0  Suicidal thoughts 0 0 0 0 0  PHQ-9 Score 0 0 0 0 0  Difficult doing work/chores Not difficult at all Not difficult at all Not difficult at all Not difficult at all -   PHQ-2/9 Result is negative.    Fall Risk: Fall Risk  07/27/2018 07/27/2018 07/27/2018 07/27/2018 05/25/2018  Falls in the past year? 0 0 0 0 0  Number falls in past yr: 0 - 0 - 0  Injury with Fall? 0 - 0 0 0  Follow up Falls evaluation completed - Falls evaluation completed Falls evaluation completed Falls prevention discussed    Assessment & Plan  1. Diabetes mellitus without complication ([place]) - Diet controlled - Hemoglobin A1c - COMPLETE METABOLIC PANEL WITH GFR  2. Prostate cancer ([place]) - Having prostatectomy in August  3. Pure hypercholesterolemia - Lipid panel  4. Obesity (BMI 30.0-34.9) - Discussed importance of 150 minutes of physical activity weekly, eat two servings of fish weekly, eat one serving of tree nuts ( cashews, pistachios, pecans, almonds.Marland Kitchen) every other day, eat 6 servings of fruit/vegetables daily and drink plenty of water and avoid sweet beverages.   5. Chronic kidney disease, stage II (mild) - CMP today  6. Vitamin D deficiency - VITAMIN D 25 Hydroxy (Vit-D Deficiency, Fractures)

## 2018-07-28 LAB — HEMOGLOBIN A1C
Hgb A1c MFr Bld: 6.6 % of total Hgb — ABNORMAL HIGH (ref <5.7)
Mean Plasma Glucose: 143 (calc)
eAG (mmol/L): 7.9 (calc)

## 2018-07-28 LAB — COMPLETE METABOLIC PANEL WITH GFR
AG Ratio: 1.7 (calc) (ref 1.0–2.5)
ALT: 24 U/L (ref 9–46)
AST: 26 U/L (ref 10–35)
Albumin: 4.3 g/dL (ref 3.6–5.1)
Alkaline phosphatase (APISO): 45 U/L (ref 35–144)
BUN: 18 mg/dL (ref 7–25)
CO2: 27 mmol/L (ref 20–32)
Calcium: 9.7 mg/dL (ref 8.6–10.3)
Chloride: 102 mmol/L (ref 98–110)
Creat: 1.15 mg/dL (ref 0.70–1.25)
GFR, Est African American: 77 mL/min/{1.73_m2} (ref >=60)
GFR, Est Non African American: 66 mL/min/{1.73_m2} (ref >=60)
Globulin: 2.6 g/dL (calc) (ref 1.9–3.7)
Glucose, Bld: 118 mg/dL — ABNORMAL HIGH (ref 65–99)
Potassium: 4.6 mmol/L (ref 3.5–5.3)
Sodium: 140 mmol/L (ref 135–146)
Total Bilirubin: 0.5 mg/dL (ref 0.2–1.2)
Total Protein: 6.9 g/dL (ref 6.1–8.1)

## 2018-07-28 LAB — LIPID PANEL
Cholesterol: 229 mg/dL — ABNORMAL HIGH (ref <200)
HDL: 54 mg/dL (ref >=40)
LDL Cholesterol (Calc): 145 mg/dL (calc) — ABNORMAL HIGH
Non-HDL Cholesterol (Calc): 175 mg/dL (calc) — ABNORMAL HIGH (ref <130)
Total CHOL/HDL Ratio: 4.2 (calc) (ref <5.0)
Triglycerides: 165 mg/dL — ABNORMAL HIGH (ref <150)

## 2018-07-28 LAB — VITAMIN D 25 HYDROXY (VIT D DEFICIENCY, FRACTURES): Vit D, 25-Hydroxy: 29 ng/mL — ABNORMAL LOW (ref 30–100)

## 2018-08-03 ENCOUNTER — Ambulatory Visit: Payer: PPO | Admitting: Family Medicine

## 2018-08-08 ENCOUNTER — Other Ambulatory Visit: Payer: Self-pay | Admitting: Family Medicine

## 2018-08-08 DIAGNOSIS — C61 Malignant neoplasm of prostate: Secondary | ICD-10-CM

## 2018-08-09 ENCOUNTER — Encounter
Admission: RE | Admit: 2018-08-09 | Discharge: 2018-08-09 | Disposition: A | Discharge status: Home / Self Care | Payer: PPO | Source: Ambulatory Visit | Attending: Urology | Admitting: Urology

## 2018-08-09 ENCOUNTER — Other Ambulatory Visit: Payer: PPO

## 2018-08-09 ENCOUNTER — Other Ambulatory Visit: Payer: Self-pay

## 2018-08-09 DIAGNOSIS — Z01818 Encounter for other preprocedural examination: Secondary | ICD-10-CM | POA: Diagnosis not present

## 2018-08-09 DIAGNOSIS — I10 Essential (primary) hypertension: Secondary | ICD-10-CM | POA: Diagnosis not present

## 2018-08-09 DIAGNOSIS — R001 Bradycardia, unspecified: Secondary | ICD-10-CM | POA: Diagnosis not present

## 2018-08-09 DIAGNOSIS — C61 Malignant neoplasm of prostate: Secondary | ICD-10-CM

## 2018-08-09 LAB — MICROSCOPIC EXAMINATION
Bacteria, UA: NONE SEEN
Epithelial Cells (non renal): NONE SEEN /hpf (ref 0–10)

## 2018-08-09 LAB — CBC
HCT: 45.9 % (ref 39.0–52.0)
Hemoglobin: 15 g/dL (ref 13.0–17.0)
MCH: 26.9 pg (ref 26.0–34.0)
MCHC: 32.7 g/dL (ref 30.0–36.0)
MCV: 82.3 fL (ref 80.0–100.0)
Platelets: 252 10*3/uL (ref 150–400)
RBC: 5.58 MIL/uL (ref 4.22–5.81)
RDW: 13.2 % (ref 11.5–15.5)
WBC: 7 10*3/uL (ref 4.0–10.5)
nRBC: 0 % (ref 0.0–0.2)

## 2018-08-09 LAB — TYPE AND SCREEN
ABO/RH(D): O POS
Antibody Screen: NEGATIVE

## 2018-08-09 LAB — URINALYSIS, COMPLETE
Bilirubin, UA: NEGATIVE
Glucose, UA: NEGATIVE
Ketones, UA: NEGATIVE
Leukocytes,UA: NEGATIVE
Nitrite, UA: NEGATIVE
Protein,UA: NEGATIVE
Specific Gravity, UA: 1.02 (ref 1.005–1.030)
Urobilinogen, Ur: 0.2 mg/dL (ref 0.2–1.0)
pH, UA: 7 (ref 5.0–7.5)

## 2018-08-09 NOTE — Patient Instructions (Signed)
Your procedure is scheduled on: August 18, 2018 FRIDAY Report to Day Surgery on the 2nd floor of the [facility]. To find out your arrival time, please call [phone] between 1PM - 3PM on: August 17, 2018 THURSDAY  REMEMBER: Instructions that are not followed completely may result in serious medical risk, up to and including death; or upon the discretion of your surgeon and anesthesiologist your surgery may need to be rescheduled.  Do not eat food after midnight the night before surgery.  No gum chewing, lozengers or hard candies.  You may however, drink CLEAR liquids up to 2 hours before you are scheduled to arrive for your surgery. Do not drink anything within 2 hours of the start of your surgery.  Clear liquids include: - water   Do NOT drink anything that is not on this list.  Type 1 and Type 2 diabetics should only drink water.  No Alcohol for 24 hours before or after surgery.  No Smoking including e-cigarettes for 24 hours prior to surgery.  No chewable tobacco products for at least 6 hours prior to surgery.  No nicotine patches on the day of surgery.  On the morning of surgery brush your teeth with toothpaste and water, you may rinse your mouth with mouthwash if you wish. Do not swallow any toothpaste or mouthwash.  Notify your doctor if there is any change in your medical condition (cold, fever, infection).  Do not wear jewelry, make-up, hairpins, clips or nail polish.  Do not wear lotions, powders, or perfumes.   Do not shave 48 hours prior to surgery.   Contacts and dentures may not be worn into surgery.  Do not bring valuables to the hospital, including drivers license, insurance or credit cards.  [facility] is not responsible for any belongings or valuables.   TAKE THESE MEDICATIONS THE MORNING OF SURGERY: NONE  Use CHG Soap as directed on instruction sheet.   Follow recommendations from Cardiologist, Pulmonologist or PCP regarding stopping Aspirin,  Coumadin, Plavix, Eliquis, Pradaxa, or Pletal.  Stop Anti-inflammatories (NSAIDS) such as Advil, Aleve, Ibuprofen, Motrin, Naproxen, Naprosyn and Aspirin based products such as Excedrin, Goodys Powder, BC Powder. (May take Tylenol or Acetaminophen if needed.)  Stop ANY OVER THE COUNTER supplements until after surgery OMEGA -3  (May continue Vitamin D, Vitamin B, and multivitamin.)  Wear comfortable clothing (specific to your surgery type) to the hospital.  Plan for stool softeners for home use.   If you are being discharged the day of surgery, you will not be allowed to drive home. You will need a responsible adult to drive you home and stay with you that night.   If you are taking public transportation, you will need to have a responsible adult with you. Please confirm with your physician that it is acceptable to use public transportation.   Please call [phone] if you have any questions about these instructions.

## 2018-08-11 LAB — CULTURE, URINE COMPREHENSIVE

## 2018-08-15 ENCOUNTER — Other Ambulatory Visit
Admission: RE | Admit: 2018-08-15 | Discharge: 2018-08-15 | Disposition: A | Discharge status: Home / Self Care | Payer: PPO | Source: Ambulatory Visit | Attending: Urology | Admitting: Urology

## 2018-08-15 ENCOUNTER — Other Ambulatory Visit: Payer: Self-pay

## 2018-08-15 DIAGNOSIS — Z01812 Encounter for preprocedural laboratory examination: Secondary | ICD-10-CM | POA: Insufficient documentation

## 2018-08-15 DIAGNOSIS — C61 Malignant neoplasm of prostate: Secondary | ICD-10-CM | POA: Insufficient documentation

## 2018-08-15 DIAGNOSIS — Z20828 Contact with and (suspected) exposure to other viral communicable diseases: Secondary | ICD-10-CM | POA: Diagnosis not present

## 2018-08-15 LAB — SARS CORONAVIRUS 2 (TAT 6-24 HRS): SARS Coronavirus 2: NEGATIVE

## 2018-08-17 MED ORDER — CEFAZOLIN SODIUM-DEXTROSE 2-4 GM/100ML-% IV SOLN
2.0000 g | INTRAVENOUS | Status: AC
  Administered 2018-08-18: 2 g via INTRAVENOUS

## 2018-08-18 ENCOUNTER — Encounter
Admission: RE | Disposition: A | Discharge status: Home / Self Care | Payer: Self-pay | Source: Home / Self Care | Attending: Urology

## 2018-08-18 ENCOUNTER — Ambulatory Visit: Payer: PPO | Admitting: Certified Registered"

## 2018-08-18 ENCOUNTER — Other Ambulatory Visit: Payer: Self-pay

## 2018-08-18 ENCOUNTER — Encounter: Payer: Self-pay | Admitting: *Deleted

## 2018-08-18 ENCOUNTER — Observation Stay
Admission: RE | Admit: 2018-08-18 | Discharge: 2018-08-20 | Disposition: A | Discharge status: Home / Self Care | Payer: PPO | Attending: Urology | Admitting: Urology

## 2018-08-18 DIAGNOSIS — N529 Male erectile dysfunction, unspecified: Secondary | ICD-10-CM | POA: Insufficient documentation

## 2018-08-18 DIAGNOSIS — N182 Chronic kidney disease, stage 2 (mild): Secondary | ICD-10-CM | POA: Diagnosis not present

## 2018-08-18 DIAGNOSIS — Z6833 Body mass index (BMI) 33.0-33.9, adult: Secondary | ICD-10-CM | POA: Insufficient documentation

## 2018-08-18 DIAGNOSIS — E1122 Type 2 diabetes mellitus with diabetic chronic kidney disease: Secondary | ICD-10-CM | POA: Insufficient documentation

## 2018-08-18 DIAGNOSIS — Z888 Allergy status to other drugs, medicaments and biological substances status: Secondary | ICD-10-CM | POA: Diagnosis not present

## 2018-08-18 DIAGNOSIS — E669 Obesity, unspecified: Secondary | ICD-10-CM | POA: Diagnosis not present

## 2018-08-18 DIAGNOSIS — N179 Acute kidney failure, unspecified: Secondary | ICD-10-CM | POA: Diagnosis not present

## 2018-08-18 DIAGNOSIS — I129 Hypertensive chronic kidney disease with stage 1 through stage 4 chronic kidney disease, or unspecified chronic kidney disease: Secondary | ICD-10-CM | POA: Diagnosis not present

## 2018-08-18 DIAGNOSIS — Z87442 Personal history of urinary calculi: Secondary | ICD-10-CM | POA: Insufficient documentation

## 2018-08-18 DIAGNOSIS — E785 Hyperlipidemia, unspecified: Secondary | ICD-10-CM | POA: Diagnosis not present

## 2018-08-18 DIAGNOSIS — C61 Malignant neoplasm of prostate: Principal | ICD-10-CM | POA: Insufficient documentation

## 2018-08-18 HISTORY — PX: PELVIC LYMPH NODE DISSECTION: SHX6543

## 2018-08-18 HISTORY — PX: ROBOT ASSISTED LAPAROSCOPIC RADICAL PROSTATECTOMY: SHX5141

## 2018-08-18 LAB — CBC
HCT: 43.7 % (ref 39.0–52.0)
Hemoglobin: 14.4 g/dL (ref 13.0–17.0)
MCH: 27 pg (ref 26.0–34.0)
MCHC: 33 g/dL (ref 30.0–36.0)
MCV: 82 fL (ref 80.0–100.0)
Platelets: 239 10*3/uL (ref 150–400)
RBC: 5.33 MIL/uL (ref 4.22–5.81)
RDW: 13.2 % (ref 11.5–15.5)
WBC: 13.4 10*3/uL — ABNORMAL HIGH (ref 4.0–10.5)
nRBC: 0 % (ref 0.0–0.2)

## 2018-08-18 LAB — HEMOGLOBIN AND HEMATOCRIT, BLOOD
HCT: 38.7 % — ABNORMAL LOW (ref 39.0–52.0)
Hemoglobin: 12.6 g/dL — ABNORMAL LOW (ref 13.0–17.0)

## 2018-08-18 LAB — GLUCOSE, CAPILLARY
Glucose-Capillary: 134 mg/dL — ABNORMAL HIGH (ref 70–99)
Glucose-Capillary: 196 mg/dL — ABNORMAL HIGH (ref 70–99)

## 2018-08-18 LAB — CREATININE, SERUM
Creatinine, Ser: 1.35 mg/dL — ABNORMAL HIGH (ref 0.61–1.24)
GFR calc Af Amer: >60 mL/min (ref >60)
GFR calc non Af Amer: 55 mL/min — ABNORMAL LOW (ref >60)

## 2018-08-18 LAB — ABO/RH: ABO/RH(D): O POS

## 2018-08-18 SURGERY — PROSTATECTOMY, RADICAL, ROBOT-ASSISTED, LAPAROSCOPIC
Anesthesia: General | Site: Abdomen

## 2018-08-18 MED ORDER — ROCURONIUM BROMIDE 100 MG/10ML IV SOLN
INTRAVENOUS | Status: DC | PRN
  Administered 2018-08-18: 5 mg via INTRAVENOUS
  Administered 2018-08-18: 10 mg via INTRAVENOUS
  Administered 2018-08-18: 20 mg via INTRAVENOUS
  Administered 2018-08-18: 50 mg via INTRAVENOUS
  Administered 2018-08-18: 10 mg via INTRAVENOUS
  Administered 2018-08-18: 20 mg via INTRAVENOUS
  Administered 2018-08-18: 10 mg via INTRAVENOUS

## 2018-08-18 MED ORDER — SUCCINYLCHOLINE CHLORIDE 20 MG/ML IJ SOLN
INTRAMUSCULAR | Status: DC | PRN
  Administered 2018-08-18: 140 mg via INTRAVENOUS

## 2018-08-18 MED ORDER — MIDAZOLAM HCL 2 MG/2ML IJ SOLN
INTRAMUSCULAR | Status: AC
  Filled 2018-08-18: qty 2

## 2018-08-18 MED ORDER — PHENYLEPHRINE HCL (PRESSORS) 10 MG/ML IV SOLN
INTRAVENOUS | Status: AC
  Filled 2018-08-18: qty 1

## 2018-08-18 MED ORDER — HEPARIN SODIUM (PORCINE) 5000 UNIT/ML IJ SOLN
INTRAMUSCULAR | Status: AC
  Administered 2018-08-18: 5000 [IU] via SUBCUTANEOUS
  Filled 2018-08-18: qty 1

## 2018-08-18 MED ORDER — PROPOFOL 10 MG/ML IV BOLUS
INTRAVENOUS | Status: DC | PRN
  Administered 2018-08-18: 170 mg via INTRAVENOUS

## 2018-08-18 MED ORDER — SODIUM CHLORIDE 0.9 % IV BOLUS
1000.0000 mL | Freq: Once | INTRAVENOUS | Status: AC
  Administered 2018-08-18: 1000 mL via INTRAVENOUS

## 2018-08-18 MED ORDER — SUCCINYLCHOLINE CHLORIDE 20 MG/ML IJ SOLN
INTRAMUSCULAR | Status: AC
  Filled 2018-08-18: qty 1

## 2018-08-18 MED ORDER — ONDANSETRON HCL 4 MG/2ML IJ SOLN
INTRAMUSCULAR | Status: AC
  Filled 2018-08-18: qty 2

## 2018-08-18 MED ORDER — DEXAMETHASONE SODIUM PHOSPHATE 10 MG/ML IJ SOLN
INTRAMUSCULAR | Status: DC | PRN
  Administered 2018-08-18: 10 mg via INTRAVENOUS

## 2018-08-18 MED ORDER — ACETAMINOPHEN 500 MG PO TABS
1000.0000 mg | ORAL_TABLET | Freq: Four times a day (QID) | ORAL | Status: AC
  Administered 2018-08-18: 1000 mg via ORAL
  Filled 2018-08-18: qty 2

## 2018-08-18 MED ORDER — BUPIVACAINE HCL (PF) 0.5 % IJ SOLN
INTRAMUSCULAR | Status: AC
  Filled 2018-08-18: qty 30

## 2018-08-18 MED ORDER — THROMBIN 5000 UNITS EX SOLR
CUTANEOUS | Status: DC | PRN
  Administered 2018-08-18: 5000 [IU] via TOPICAL

## 2018-08-18 MED ORDER — FAMOTIDINE 20 MG PO TABS
ORAL_TABLET | ORAL | Status: AC
  Administered 2018-08-18: 20 mg via ORAL
  Filled 2018-08-18: qty 1

## 2018-08-18 MED ORDER — PROPOFOL 10 MG/ML IV BOLUS
INTRAVENOUS | Status: AC
  Filled 2018-08-18: qty 20

## 2018-08-18 MED ORDER — THROMBIN 5000 UNITS EX SOLR
CUTANEOUS | Status: AC
  Filled 2018-08-18: qty 5000

## 2018-08-18 MED ORDER — SODIUM CHLORIDE 0.9 % IV SOLN
INTRAVENOUS | Status: DC
  Administered 2018-08-18 (×3): via INTRAVENOUS

## 2018-08-18 MED ORDER — ROCURONIUM BROMIDE 50 MG/5ML IV SOLN
INTRAVENOUS | Status: AC
  Filled 2018-08-18: qty 1

## 2018-08-18 MED ORDER — FENTANYL CITRATE (PF) 100 MCG/2ML IJ SOLN
INTRAMUSCULAR | Status: AC
  Filled 2018-08-18: qty 2

## 2018-08-18 MED ORDER — HYDROCODONE-ACETAMINOPHEN 5-325 MG PO TABS: 1.0000 | ORAL_TABLET | ORAL | 0 refills | Status: AC | PRN

## 2018-08-18 MED ORDER — ACETAMINOPHEN 10 MG/ML IV SOLN
INTRAVENOUS | Status: AC
  Filled 2018-08-18: qty 100

## 2018-08-18 MED ORDER — HYDROCODONE-ACETAMINOPHEN 5-325 MG PO TABS
1.0000 | ORAL_TABLET | ORAL | Status: DC | PRN
  Administered 2018-08-18 – 2018-08-19 (×4): 2 via ORAL
  Filled 2018-08-18 (×5): qty 2

## 2018-08-18 MED ORDER — LIDOCAINE HCL (PF) 2 % IJ SOLN
INTRAMUSCULAR | Status: AC
  Filled 2018-08-18: qty 10

## 2018-08-18 MED ORDER — DEXAMETHASONE SODIUM PHOSPHATE 10 MG/ML IJ SOLN
INTRAMUSCULAR | Status: AC
  Filled 2018-08-18: qty 1

## 2018-08-18 MED ORDER — FENTANYL CITRATE (PF) 100 MCG/2ML IJ SOLN
INTRAMUSCULAR | Status: AC
  Administered 2018-08-18: 50 ug via INTRAVENOUS
  Filled 2018-08-18: qty 2

## 2018-08-18 MED ORDER — SUGAMMADEX SODIUM 200 MG/2ML IV SOLN
INTRAVENOUS | Status: AC
  Filled 2018-08-18: qty 2

## 2018-08-18 MED ORDER — MIDAZOLAM HCL 2 MG/2ML IJ SOLN
INTRAMUSCULAR | Status: DC | PRN
  Administered 2018-08-18: 2 mg via INTRAVENOUS

## 2018-08-18 MED ORDER — PROMETHAZINE HCL 25 MG/ML IJ SOLN: 6.2500 mg | INTRAMUSCULAR | Status: DC | PRN

## 2018-08-18 MED ORDER — OXYBUTYNIN CHLORIDE ER 5 MG PO TB24
10.0000 mg | ORAL_TABLET | Freq: Every day | ORAL | Status: DC
  Administered 2018-08-19 – 2018-08-20 (×2): 10 mg via ORAL
  Filled 2018-08-18 (×2): qty 2

## 2018-08-18 MED ORDER — KETAMINE HCL 10 MG/ML IJ SOLN
INTRAMUSCULAR | Status: DC | PRN
  Administered 2018-08-18 (×2): 10 mg via INTRAVENOUS
  Administered 2018-08-18: 20 mg via INTRAVENOUS
  Administered 2018-08-18: 40 mg via INTRAVENOUS

## 2018-08-18 MED ORDER — DOCUSATE SODIUM 100 MG PO CAPS: 100.0000 mg | ORAL_CAPSULE | Freq: Two times a day (BID) | ORAL | 0 refills | Status: DC

## 2018-08-18 MED ORDER — HYDROMORPHONE HCL 1 MG/ML IJ SOLN
INTRAMUSCULAR | Status: AC
  Administered 2018-08-18: 0.25 mg via INTRAVENOUS
  Filled 2018-08-18: qty 1

## 2018-08-18 MED ORDER — LACTATED RINGERS IV SOLN: INTRAVENOUS | Status: DC | PRN

## 2018-08-18 MED ORDER — HEPARIN SODIUM (PORCINE) 5000 UNIT/ML IJ SOLN
5000.0000 [IU] | INTRAMUSCULAR | Status: AC
  Administered 2018-08-18: 07:00:00 5000 [IU] via SUBCUTANEOUS

## 2018-08-18 MED ORDER — ONDANSETRON HCL 4 MG/2ML IJ SOLN
INTRAMUSCULAR | Status: DC | PRN
  Administered 2018-08-18: 4 mg via INTRAVENOUS

## 2018-08-18 MED ORDER — OXYCODONE HCL 5 MG/5ML PO SOLN: 5.0000 mg | Freq: Once | ORAL | Status: DC | PRN

## 2018-08-18 MED ORDER — MEPERIDINE HCL 50 MG/ML IJ SOLN: 6.2500 mg | INTRAMUSCULAR | Status: DC | PRN

## 2018-08-18 MED ORDER — DEXTROSE-NACL 5-0.45 % IV SOLN
INTRAVENOUS | Status: DC
  Administered 2018-08-18 – 2018-08-19 (×2): via INTRAVENOUS

## 2018-08-18 MED ORDER — CEFAZOLIN SODIUM-DEXTROSE 2-4 GM/100ML-% IV SOLN
INTRAVENOUS | Status: AC
  Filled 2018-08-18: qty 100

## 2018-08-18 MED ORDER — ONDANSETRON HCL 4 MG/2ML IJ SOLN: 4.0000 mg | INTRAMUSCULAR | Status: DC | PRN

## 2018-08-18 MED ORDER — BUPIVACAINE HCL 0.5 % IJ SOLN
INTRAMUSCULAR | Status: DC | PRN
  Administered 2018-08-18: 20 mL
  Administered 2018-08-18: 10 mL

## 2018-08-18 MED ORDER — DOCUSATE SODIUM 100 MG PO CAPS
100.0000 mg | ORAL_CAPSULE | Freq: Two times a day (BID) | ORAL | Status: DC
  Administered 2018-08-18 – 2018-08-20 (×4): 100 mg via ORAL
  Filled 2018-08-18 (×4): qty 1

## 2018-08-18 MED ORDER — HEPARIN SODIUM (PORCINE) 5000 UNIT/ML IJ SOLN
5000.0000 [IU] | Freq: Three times a day (TID) | INTRAMUSCULAR | Status: DC
  Administered 2018-08-19 – 2018-08-20 (×4): 5000 [IU] via SUBCUTANEOUS
  Filled 2018-08-18 (×4): qty 1

## 2018-08-18 MED ORDER — ACETAMINOPHEN 10 MG/ML IV SOLN
INTRAVENOUS | Status: DC | PRN
  Administered 2018-08-18: 1000 mg via INTRAVENOUS

## 2018-08-18 MED ORDER — LIDOCAINE HCL (CARDIAC) PF 100 MG/5ML IV SOSY
PREFILLED_SYRINGE | INTRAVENOUS | Status: DC | PRN
  Administered 2018-08-18: 100 mg via INTRAVENOUS

## 2018-08-18 MED ORDER — PHENYLEPHRINE HCL (PRESSORS) 10 MG/ML IV SOLN
INTRAVENOUS | Status: DC | PRN
  Administered 2018-08-18 (×2): 100 ug via INTRAVENOUS

## 2018-08-18 MED ORDER — OXYCODONE HCL 5 MG PO TABS: 5.0000 mg | ORAL_TABLET | Freq: Once | ORAL | Status: DC | PRN

## 2018-08-18 MED ORDER — FENTANYL CITRATE (PF) 100 MCG/2ML IJ SOLN
25.0000 ug | INTRAMUSCULAR | Status: DC | PRN
  Administered 2018-08-18 (×3): 50 ug via INTRAVENOUS

## 2018-08-18 MED ORDER — FENTANYL CITRATE (PF) 100 MCG/2ML IJ SOLN
INTRAMUSCULAR | Status: DC | PRN
  Administered 2018-08-18: 100 ug via INTRAVENOUS

## 2018-08-18 MED ORDER — HYDROMORPHONE HCL 1 MG/ML IJ SOLN
0.5000 mg | INTRAMUSCULAR | Status: DC | PRN
  Administered 2018-08-18: 1 mg via INTRAVENOUS
  Filled 2018-08-18: qty 1

## 2018-08-18 MED ORDER — HYDROMORPHONE HCL 1 MG/ML IJ SOLN
0.2500 mg | INTRAMUSCULAR | Status: DC | PRN
  Administered 2018-08-18: 0.25 mg via INTRAVENOUS

## 2018-08-18 MED ORDER — ONDANSETRON HCL 4 MG/2ML IJ SOLN: 4.0000 mg | INTRAMUSCULAR | 0 refills | Status: DC | PRN

## 2018-08-18 MED ORDER — KETAMINE HCL 50 MG/ML IJ SOLN
INTRAMUSCULAR | Status: AC
  Filled 2018-08-18: qty 10

## 2018-08-18 MED ORDER — SUGAMMADEX SODIUM 200 MG/2ML IV SOLN
INTRAVENOUS | Status: DC | PRN
  Administered 2018-08-18 (×2): 100 mg via INTRAVENOUS

## 2018-08-18 MED ORDER — FAMOTIDINE 20 MG PO TABS
20.0000 mg | ORAL_TABLET | Freq: Once | ORAL | Status: AC
  Administered 2018-08-18: 07:00:00 20 mg via ORAL

## 2018-08-18 SURGICAL SUPPLY — 102 items
ANCHOR TIS RET SYS 235ML (MISCELLANEOUS) ×3 IMPLANT
APPLICATOR SURGIFLO ENDO (HEMOSTASIS) ×3 IMPLANT
APPLIER CLIP LOGIC TI 5 (MISCELLANEOUS) IMPLANT
BAG URINE DRAINAGE (UROLOGICAL SUPPLIES) ×3 IMPLANT
BLADE CLIPPER SURG (BLADE) ×3 IMPLANT
BULB RESERV EVAC DRAIN JP 100C (MISCELLANEOUS) ×1 IMPLANT
CANISTER SUCT 1200ML W/VALVE (MISCELLANEOUS) ×3 IMPLANT
CATH DRAINAGE MALECOT 26FR (CATHETERS) ×2 IMPLANT
CATH FOL 2WAY LX 18X5 (CATHETERS) ×6 IMPLANT
CATH MALECOT (CATHETERS) ×3
CHLORAPREP W/TINT 26 (MISCELLANEOUS) ×6 IMPLANT
CLIP SUT LAPRA TY ABSORB (SUTURE) IMPLANT
CLIP VESOLOCK LG 6/CT PURPLE (CLIP) ×7 IMPLANT
CORD BIP STRL DISP 12FT (MISCELLANEOUS) ×3 IMPLANT
CORD MONOPOLAR M/FML 12FT (MISCELLANEOUS) IMPLANT
COVER TIP SHEARS 8 DVNC (MISCELLANEOUS) ×2 IMPLANT
COVER TIP SHEARS 8MM DA VINCI (MISCELLANEOUS) ×1
COVER WAND RF STERILE (DRAPES) ×3 IMPLANT
DEFOGGER SCOPE WARMER CLEARIFY (MISCELLANEOUS) ×3 IMPLANT
DERMABOND ADVANCED (GAUZE/BANDAGES/DRESSINGS) ×1
DERMABOND ADVANCED .7 DNX12 (GAUZE/BANDAGES/DRESSINGS) ×2 IMPLANT
DRAIN CHANNEL JP 15F RND 16 (MISCELLANEOUS) ×1 IMPLANT
DRAIN CHANNEL JP 19F (MISCELLANEOUS) IMPLANT
DRAPE 3/4 80X56 (DRAPES) ×3 IMPLANT
DRAPE COLUMN DVNC XI (DISPOSABLE) ×2 IMPLANT
DRAPE DA VINCI XI COLUMN (DISPOSABLE) ×1
DRAPE LEGGINS SURG 28X43 STRL (DRAPES) ×3 IMPLANT
DRAPE SURG 17X11 SM STRL (DRAPES) ×12 IMPLANT
DRAPE UNDER BUTTOCK W/FLU (DRAPES) ×3 IMPLANT
DRIVER LRG NEEDLE DA VINCI (INSTRUMENTS) ×2
DRIVER NDLE LRG DVNC (INSTRUMENTS) ×4 IMPLANT
DRSG TELFA 3X8 NADH (GAUZE/BANDAGES/DRESSINGS) ×3 IMPLANT
ELECT REM PT RETURN 9FT ADLT (ELECTROSURGICAL) ×3
ELECTRODE REM PT RTRN 9FT ADLT (ELECTROSURGICAL) ×2 IMPLANT
FORCEPS MARYLAND BIPOLAR 8X55 (INSTRUMENTS) ×1
FORCEPS MRYLND BPLR 8X55 DVNC (INSTRUMENTS) ×2 IMPLANT
GLOVE BIO SURGEON STRL SZ 6.5 (GLOVE) ×9 IMPLANT
GLOVE BIOGEL PI IND STRL 7.5 (GLOVE) ×2 IMPLANT
GLOVE BIOGEL PI INDICATOR 7.5 (GLOVE) ×1
GOWN STRL REUS W/ TWL LRG LVL3 (GOWN DISPOSABLE) ×6 IMPLANT
GOWN STRL REUS W/ TWL XL LVL3 (GOWN DISPOSABLE) ×4 IMPLANT
GOWN STRL REUS W/TWL LRG LVL3 (GOWN DISPOSABLE) ×3
GOWN STRL REUS W/TWL XL LVL3 (GOWN DISPOSABLE) ×2
GRASPER SUT TROCAR 14GX15 (MISCELLANEOUS) ×3 IMPLANT
HEMOSTAT SURGICEL 2X14 (HEMOSTASIS) IMPLANT
HOLDER FOLEY CATH W/STRAP (MISCELLANEOUS) ×3 IMPLANT
IRRIGATION STRYKERFLOW (MISCELLANEOUS) ×2 IMPLANT
IRRIGATOR STRYKERFLOW (MISCELLANEOUS) ×3
IV LACTATED RINGERS 1000ML (IV SOLUTION) ×3 IMPLANT
KIT PINK PAD W/HEAD ARE REST (MISCELLANEOUS) ×3
KIT PINK PAD W/HEAD ARM REST (MISCELLANEOUS) ×2 IMPLANT
LABEL OR SOLS (LABEL) ×3 IMPLANT
MARKER SKIN DUAL TIP RULER LAB (MISCELLANEOUS) ×3 IMPLANT
NDL INSUFF 14G 150MM VS150000 (NEEDLE) ×1 IMPLANT
NDL INSUFFLATION 14GA 120MM (NEEDLE) ×2 IMPLANT
NEEDLE HYPO 22GX1.5 SAFETY (NEEDLE) ×3 IMPLANT
NEEDLE INSUFFLATION 14GA 120MM (NEEDLE) ×3 IMPLANT
NS IRRIG 500ML POUR BTL (IV SOLUTION) ×3 IMPLANT
OBTURATOR OPTICAL STANDARD 8MM (TROCAR) ×1
OBTURATOR OPTICAL STND 8 DVNC (TROCAR) ×2
OBTURATOR OPTICALSTD 8 DVNC (TROCAR) ×2 IMPLANT
PACK LAP CHOLECYSTECTOMY (MISCELLANEOUS) ×3 IMPLANT
PAD DRESSING TELFA 3X8 NADH (GAUZE/BANDAGES/DRESSINGS) ×2 IMPLANT
PENCIL ELECTRO HAND CTR (MISCELLANEOUS) ×3 IMPLANT
PROGRASP ENDOWRIST DA VINCI (INSTRUMENTS) ×1
PROGRASP ENDOWRIST DVNC (INSTRUMENTS) ×2 IMPLANT
RELOAD STAPLE 60 2.6 WHT THN (STAPLE) IMPLANT
RELOAD STAPLER WHITE 60MM (STAPLE) ×4 IMPLANT
SET TUBE SMOKE EVAC HIGH FLOW (TUBING) ×3 IMPLANT
SLEEVE ENDOPATH XCEL 5M (ENDOMECHANICALS) ×6 IMPLANT
SOLUTION ELECTROLUBE (MISCELLANEOUS) ×3 IMPLANT
SPOGE SURGIFLO 8M (HEMOSTASIS) ×1
SPONGE LAP 4X18 RFD (DISPOSABLE) ×3 IMPLANT
SPONGE SURGIFLO 8M (HEMOSTASIS) ×2 IMPLANT
SPONGE VERSALON 4X4 4PLY (MISCELLANEOUS) IMPLANT
STAPLE ECHEON FLEX 60 POW ENDO (STAPLE) ×1 IMPLANT
STAPLER RELOAD WHITE 60MM (STAPLE) ×6
STAPLER SKIN PROX 35W (STAPLE) ×3 IMPLANT
STRAP SAFETY 5IN WIDE (MISCELLANEOUS) ×3 IMPLANT
SURGILUBE 2OZ TUBE FLIPTOP (MISCELLANEOUS) ×3 IMPLANT
SUT DVC VLOC 90 3-0 CV23 UNDY (SUTURE) ×3 IMPLANT
SUT DVC VLOC 90 3-0 CV23 VLT (SUTURE) ×3
SUT ETHILON 3-0 FS-10 30 BLK (SUTURE) ×3
SUT MNCRL 4-0 (SUTURE) ×2
SUT MNCRL 4-0 27XMFL (SUTURE) ×4
SUT PROLENE 5 0 RB 1 DA (SUTURE) IMPLANT
SUT SILK 2 0 SH (SUTURE) ×3 IMPLANT
SUT VIC AB 0 CT1 36 (SUTURE) ×6 IMPLANT
SUT VIC AB 2-0 CT1 (SUTURE) ×6 IMPLANT
SUT VIC AB 2-0 SH 27 (SUTURE) ×1
SUT VIC AB 2-0 SH 27XBRD (SUTURE) ×4 IMPLANT
SUT VICRYL 0 AB UR-6 (SUTURE) ×4 IMPLANT
SUTURE DVC VLC 90 3-0 CV23 VLT (SUTURE) ×2 IMPLANT
SUTURE EHLN 3-0 FS-10 30 BLK (SUTURE) IMPLANT
SUTURE MNCRL 4-0 27XMF (SUTURE) ×4 IMPLANT
SYR 10ML LL (SYRINGE) ×3 IMPLANT
SYR BULB IRRIG 60ML STRL (SYRINGE) ×3 IMPLANT
SYRINGE IRR TOOMEY STRL 70CC (SYRINGE) ×6 IMPLANT
TAPE CLOTH 3X10 WHT NS LF (GAUZE/BANDAGES/DRESSINGS) ×6 IMPLANT
TROCAR ENDOPATH XCEL 12X100 BL (ENDOMECHANICALS) ×6 IMPLANT
TROCAR XCEL 12X100 BLDLESS (ENDOMECHANICALS) ×3 IMPLANT
TROCAR XCEL NON-BLD 5MMX100MML (ENDOMECHANICALS) ×3 IMPLANT

## 2018-08-18 NOTE — Transfer of Care (Signed)
Immediate Anesthesia Transfer of Care Note  Patient: John Benson.  Procedure(s) Performed: XI ROBOTIC ASSISTED LAPAROSCOPIC RADICAL PROSTATECTOMY (N/A Abdomen) PELVIC LYMPH NODE DISSECTION (Bilateral Abdomen)  Patient Location: PACU  Anesthesia Type:General  Level of Consciousness: awake and alert   Airway & Oxygen Therapy: Patient Spontanous Breathing and Patient connected to face mask oxygen  Post-op Assessment: Report given to RN and Post -op Vital signs reviewed and stable  Post vital signs: Reviewed and stable  Last Vitals:  Vitals Value Taken Time  BP 153/77 08/18/18 1225  Temp    Pulse 80 08/18/18 1227  Resp 16 08/18/18 1227  SpO2 100 % 08/18/18 1227  Vitals shown include unvalidated device data.  Last Pain:  Vitals:   08/18/18 0616  TempSrc: Temporal  PainSc: 0-No pain         Complications: No apparent anesthesia complications

## 2018-08-18 NOTE — H&P (Signed)
08/18/18 7:13 AM   [name]. November 06, 1950 [id number]  CC: High risk prostate cancer  HPI: Mr. Costilla is a healthy 68 year old African-American male with no other significant medical problems who was recently diagnosed with high risk prostate cancer with no evidence of metastatic disease on CT or bone scan.  PSA was 20 at time of diagnosis.  He denies any prior abdominal surgeries.  We previously discussed the risks and benefits at length of treatment options including surgery versus radiation and hormones, and he has elected to proceed with robotic prostatectomy and lymphadenectomy.  He denies any chest pain or shortness of breath.  He has moderate to severe erectile dysfunction at baseline only minimally responsive to PDE 5 inhibitors.   PMH: Past Medical History:  Diagnosis Date  . Benign essential HTN   . Calculus of kidney   . Carpal tunnel syndrome   . Carpal tunnel syndrome 07/15/2014  . Chronic kidney disease, stage II (mild)   . Diabetes mellitus without complication ([facility])    controlled by diet and exercise  . History of kidney stones   . HTN (hypertension)    maintained by diet and exercise  . Hyperlipidemia   . Hypogonadism in male   . Obesity     Surgical History: Past Surgical History:  Procedure Laterality Date  . COLONOSCOPY    . LITHOTRIPSY       Allergies:  Allergies  Allergen Reactions  . Testosterone Itching and Rash    Family History: Family History  Problem Relation Age of Onset  . Diabetes Mother   . Cancer Mother        breast  . Vision loss Mother   . Hypertension Father   . Dementia Father   . Diabetes Brother   . Early death Brother   . Pancreatic disease Brother   . Early death Sister   . Kidney disease Sister   . Kidney cancer Neg Hx   . Prostate cancer Neg Hx     Social History:  reports that he has never smoked. He has never used smokeless tobacco. He reports that he does not drink alcohol or use drugs.  ROS: Please  see flowsheet from today's date for complete review of systems.  Physical Exam: BP (!) 147/84   Pulse 74   Temp 97.6 F (36.4 C)   Resp 16   Ht 5' 9.5" (1.765 m)   Wt 104.8 kg   SpO2 100%   BMI 33.62 kg/m    Constitutional:  Alert and oriented, No acute distress. Cardiovascular: Regular rate and rhythm Respiratory: Clear to auscultation bilaterally GI: Abdomen is soft, nontender, nondistended, no abdominal masses GU: No CVA tenderness Lymph: No cervical or inguinal lymphadenopathy. Skin: No rashes, bruises or suspicious lesions. Neurologic: Grossly intact, no focal deficits, moving all 4 extremities. Psychiatric: Normal mood and affect.  Laboratory Data: Reviewed  Pertinent Imaging: I have personally reviewed the CT and bone scan, no evidence of metastatic disease  Assessment & Plan:   In summary, he is a healthy 68 year old male with high risk localized prostate cancer, here today for robotic prostatectomy and lymphadenectomy.  We discussed robotic prostatectomy +/- lymphadenectomy at length.  The procedure takes 3 to 4 hours, and patient's typically discharge home on post-op day #1.  A Foley catheter is left in place for 7 to 10 days to allow for healing of the vesicourethral anastomosis.  There is a small risk of bleeding, infection, damage to surrounding structures or bowel,  hernia, DVT/PE, or serious cardiac or pulmonary complications.  We discussed at length post-op side effects including erectile dysfunction, and the importance of pre-operative erectile function on long-term outcomes.  We discussed the need for wide surgical margins with his high risk disease, and high likelihood of postoperative erectile dysfunction.  We also discussed post-op urinary incontinence at length.  We expect patients to have stress incontinence post-operatively that will improve over period of weeks to months.  Less than 10% of men will require a pad at 1 year after surgery.  Patients will need to  avoid heavy lifting and strenuous activity for 3 to 4 weeks, but most men return to their baseline activity status by 6 weeks.  Finally, we discussed the role of final pathology on neck steps in care including possible observation, radiation, or hormonal therapy.  Billey Co, [facility] [address], [place], [place] [address] [phone]

## 2018-08-18 NOTE — Progress Notes (Signed)
Patient with significant JP drain output near 283ml in the 4 hours since patient admission. Drainage appears to be more viscous than it was on arrival. Blood/urine in foley is also thicker and appearing more like blood than it originally had. VSS and pt is asymptomatic. Notified MD on call and orders for H and H were placed. Labs were drawn at 11534 and we will be able to compare the levels and determine if intervention is needed.

## 2018-08-18 NOTE — Anesthesia Postprocedure Evaluation (Signed)
Anesthesia Post Note  Patient: John Benson.  Procedure(s) Performed: XI ROBOTIC ASSISTED LAPAROSCOPIC RADICAL PROSTATECTOMY (N/A Abdomen) PELVIC LYMPH NODE DISSECTION (Bilateral Abdomen)  Patient location during evaluation: PACU Anesthesia Type: General Level of consciousness: awake and alert and oriented Pain management: pain level controlled Vital Signs Assessment: post-procedure vital signs reviewed and stable Respiratory status: spontaneous breathing, nonlabored ventilation and respiratory function stable Cardiovascular status: blood pressure returned to baseline and stable Postop Assessment: no signs of nausea or vomiting Anesthetic complications: no     Last Vitals:  Vitals:   08/18/18 1240 08/18/18 1255  BP: (!) 167/83 (!) 162/75  Pulse: 72 73  Resp: 20 18  Temp:    SpO2: 98% 96%    Last Pain:  Vitals:   08/18/18 1255  TempSrc:   PainSc: Asleep                 Cloria Ciresi

## 2018-08-18 NOTE — Anesthesia Preprocedure Evaluation (Signed)
Anesthesia Evaluation  Patient identified by MRN, date of birth, ID band Patient awake    Reviewed: Allergy & Precautions, NPO status , Patient's Chart, lab work & pertinent test results  History of Anesthesia Complications Negative for: history of anesthetic complications  Airway Mallampati: II  TM Distance: >3 FB Neck ROM: Full    Dental no notable dental hx.    Pulmonary neg pulmonary ROS, neg sleep apnea, neg COPD,    breath sounds clear to auscultation- rhonchi (-) wheezing      Cardiovascular hypertension, (-) CAD, (-) Past MI, (-) Cardiac Stents and (-) CABG  Rhythm:Regular Rate:Normal - Systolic murmurs and - Diastolic murmurs    Neuro/Psych neg Seizures negative neurological ROS  negative psych ROS   GI/Hepatic negative GI ROS, Neg liver ROS,   Endo/Other  diabetes (diet controlled)  Renal/GU Renal InsufficiencyRenal disease     Musculoskeletal negative musculoskeletal ROS (+)   Abdominal (+) + obese,   Peds  Hematology negative hematology ROS (+)   Anesthesia Other Findings Past Medical History: No date: Benign essential HTN No date: Calculus of kidney No date: Carpal tunnel syndrome 07/15/2014: Carpal tunnel syndrome No date: Chronic kidney disease, stage II (mild) No date: Diabetes mellitus without complication ([name])     Comment:  controlled by diet and exercise No date: History of kidney stones No date: HTN (hypertension)     Comment:  maintained by diet and exercise No date: Hyperlipidemia No date: Hypogonadism in male No date: Obesity   Reproductive/Obstetrics                             Anesthesia Physical Anesthesia Plan  ASA: II  Anesthesia Plan: General   Post-op Pain Management:    Induction: Intravenous  PONV Risk Score and Plan: 1 and Ondansetron and Dexamethasone  Airway Management Planned: Oral ETT  Additional Equipment:   Intra-op Plan:    Post-operative Plan: Extubation in OR  Informed Consent: I have reviewed the patients History and Physical, chart, labs and discussed the procedure including the risks, benefits and alternatives for the proposed anesthesia with the patient or authorized representative who has indicated his/her understanding and acceptance.     Dental advisory given  Plan Discussed with: CRNA and Anesthesiologist  Anesthesia Plan Comments:         Anesthesia Quick Evaluation

## 2018-08-18 NOTE — Anesthesia Post-op Follow-up Note (Signed)
Anesthesia QCDR form completed.        

## 2018-08-18 NOTE — Anesthesia Procedure Notes (Signed)
Procedure Name: Intubation Date/Time: 08/18/2018 7:44 AM Performed by: Rona Ravens, CRNA Pre-anesthesia Checklist: Patient identified, Emergency Drugs available, Suction available, Patient being monitored and Timeout performed Patient Re-evaluated:Patient Re-evaluated prior to induction Oxygen Delivery Method: Circle system utilized Preoxygenation: Pre-oxygenation with 100% oxygen Induction Type: IV induction and Rapid sequence Laryngoscope Size: Mac and 4 Grade View: Grade II Tube type: Oral Tube size: 8.0 mm Number of attempts: 1 Airway Equipment and Method: Stylet Placement Confirmation: ETT inserted through vocal cords under direct vision,  CO2 detector,  positive ETCO2 and breath sounds checked- equal and bilateral Secured at: 22 cm Tube secured with: Tape Dental Injury: Teeth and Oropharynx as per pre-operative assessment

## 2018-08-18 NOTE — Op Note (Signed)
Date of procedure: 08/18/18  Preoperative diagnosis:  1. High risk prostate cancer  Postoperative diagnosis:  1. Same  Procedure: 1. Da Vinci robotic prostatectomy and bilateral pelvic lymph node dissection  Surgeon: Nickolas Madrid, MD  Assistant: Hollice Espy, MD  Anesthesia: General  Complications: None  Intraoperative findings:  1.  No obvious extraprostatic extension or bulky lymph nodes 2.  Watertight urethral anastomosis  EBL: 200 cc  Specimens:  1.  Prostate and seminal vesicles 2.  Periprostatic fat 3.  Right pelvic lymph nodes 4.  Left pelvic lymph nodes 5.  Posterior prostate margin  Drains: 18 [place] Foley, 15 [place] Blake drain  Indication: John Benson. is a 68 y.o. patient with high risk prostate cancer with no evidence of metastatic disease on CT and bone scan.  We discussed treatment options including surgery or radiation and hormone therapy, and he elected for robotic prostatectomy.  After reviewing the management options for treatment, they elected to proceed with the above surgical procedure(s). We have discussed the potential benefits and risks of the procedure, side effects of the proposed treatment, the likelihood of the patient achieving the goals of the procedure, and any potential problems that might occur during the procedure or recuperation. Informed consent has been obtained.  Description of procedure:  The patient was taken to the operating room and general anesthesia was induced. SCDs were placed for DVT prophylaxis, and 5000 units subcutaneous heparin were given.  The patient was placed in the dorsal lithotomy position, carefully padded with the arms against the sides, prepped and draped in the usual sterile fashion, and preoperative antibiotics(cefazolin) were administered. A preoperative time-out was performed.   On the field, an 21 [place] Foley catheter passed easily into the bladder with return of clear yellow urine.  An 8 mm incision  was made above the umbilicus, and a Veress needle was used to obtain access to the peritoneum.  The abdomen was insufflated to 15 mmHg, and the robotic ports were placed in standard fashion under direct vision.  Thorough inspection of the abdomen showed no bowel injury or other abnormal findings.  Lidocaine was injected into all port sites prior to placement.  The patient was then placed in steep Trendelenburg position and the robot was docked.  I started by entering the space of Retzius and taking down the bladder.  Once this was free to the vas deferens bilaterally, the periprostatic fat was cleaned off the prostate.  This was sent for permanent.  The endopelvic fascia was opened bilaterally, and muscle fibers spared laterally.  The periprostatic ligaments were carefully taken down.  The DVC was isolated, and ligated using a 60 mm load vascular stapler.  I then turned my attention to the anterior bladder neck, and this was incised down to the level of the Foley catheter.  The Foley catheter was removed from the bladder and gently held upward for traction.  The posterior bladder neck was carefully divided, and we were able to find the plane between the prostate and the seminal vesicles.  At this point, Weck clips were used to take down the pedicles laterally and maximize exposure for the dissection of the seminal vesicles.  The ampulla of the vas was divided bilaterally, and held up for retraction.  The seminal vesicles were then dissected out bilaterally intact.  There was an excellent plane posterior to the prostate, and the prostate was freed from the rectum.  The remaining vascular pedicle was divided using Weck clips and cold scissors.  A nerve sparing was not performed secondary to his high risk prostate adenocarcinoma, and lack of erectile function preop.  The prostate was then gently retracted superiorly, and the apex was dissected free.  There was excellent hemostasis from the prior staple load on the  DVC.  Once the urethra was identified, the use of electrocautery was minimized.  The urethra was sharply entered and the Foley catheter removed.  The posterior urethral plate was divided and the prostate was free.  It was placed in an Endo Catch bag and moved out of the field.  There was excellent hemostasis in the pelvis.  I then moved to the pelvic lymph node dissection.  I started by incising the fascia overlying the right external iliac vein, and dissecting down distally.  The note of Cloquet was removed and clips were placed to divide lymphatics.  The lateral aspect of the dissection was the pelvic sidewall, inferior was the obturator nerve, and proximal hypogastric vessels.  Lymphatic tissue was removed with spoon grasper and sent to pathology.  An identical procedure was performed on the left side.  I turned my attention back to the bladder neck and anastomosis.  There was a small piece of prostatic appearing tissue at the left bladder neck, and this was removed using cold scissors and sent for a posterior prostate margin.   A 3-0 V lock stitch was used to reapproximate the tissue behind the bladder and posteriorly to the urethra as described by Rocco in order to remove tension from the anastomosis.  Two 3-0 V lock stitches were connected and used for the urethrovesical anastomosis.  A running anastomosis was performed circumferentially and there appeared to be excellent approximation of the mucosa on both sides.  A new 18 French Foley passed easily into the bladder, and 150 cc normal saline were instilled with no leakage noted in the operative field.  The sutures were tied anteriorly.  10 cc were placed in the balloon.  There was good hemostasis.  A small piece of Surgicel was placed on either side of the anastomosis along the endopelvic fascia in addition to Surgi-Flo.  A 15 [place] Blake drain was passed through the left lateral robotic port and placed in the pelvis.  This was secured to the skin  exteriorly with a drain stitch.  All instruments were removed and the robot was undocked.  A Carter-Thomason device was used to close the right lateral 11 mm port under direct vision laparoscopically.  The supraumbilical incision was extended superiorly, and the specimen was removed.  The small midline incision was closed using a running 0 Vicryl.  All port sites were copiously irrigated.  There was good hemostasis.  A running 4-0 Monocryl was used to close all incisions, and Dermabond was applied.  All counts were correct, and the patient was awakened and taken to PACU in stable condition.  Disposition: Stable to PACU  Plan: Likely remove drain tomorrow and discharge home with catheter in place for 1 week  Nickolas Madrid, MD

## 2018-08-19 ENCOUNTER — Encounter: Payer: Self-pay | Admitting: Urology

## 2018-08-19 ENCOUNTER — Observation Stay: Payer: PPO

## 2018-08-19 DIAGNOSIS — C61 Malignant neoplasm of prostate: Secondary | ICD-10-CM | POA: Diagnosis not present

## 2018-08-19 DIAGNOSIS — N179 Acute kidney failure, unspecified: Secondary | ICD-10-CM | POA: Diagnosis not present

## 2018-08-19 LAB — CBC
HCT: 39.7 % (ref 39.0–52.0)
Hemoglobin: 13 g/dL (ref 13.0–17.0)
MCH: 27 pg (ref 26.0–34.0)
MCHC: 32.7 g/dL (ref 30.0–36.0)
MCV: 82.4 fL (ref 80.0–100.0)
Platelets: 231 10*3/uL (ref 150–400)
RBC: 4.82 MIL/uL (ref 4.22–5.81)
RDW: 13.4 % (ref 11.5–15.5)
WBC: 10.3 10*3/uL (ref 4.0–10.5)
nRBC: 0 % (ref 0.0–0.2)

## 2018-08-19 LAB — BASIC METABOLIC PANEL
Anion gap: 7 (ref 5–15)
BUN: 18 mg/dL (ref 8–23)
CO2: 26 mmol/L (ref 22–32)
Calcium: 8.6 mg/dL — ABNORMAL LOW (ref 8.9–10.3)
Chloride: 105 mmol/L (ref 98–111)
Creatinine, Ser: 1.51 mg/dL — ABNORMAL HIGH (ref 0.61–1.24)
GFR calc Af Amer: 55 mL/min — ABNORMAL LOW (ref >60)
GFR calc non Af Amer: 48 mL/min — ABNORMAL LOW (ref >60)
Glucose, Bld: 180 mg/dL — ABNORMAL HIGH (ref 70–99)
Potassium: 4.7 mmol/L (ref 3.5–5.1)
Sodium: 138 mmol/L (ref 135–145)

## 2018-08-19 LAB — CREATININE, FLUID (PLEURAL, PERITONEAL, JP DRAINAGE): Creat, Fluid: 1.3 mg/dL

## 2018-08-19 NOTE — Care Management Obs Status (Signed)
[place] NOTIFICATION   Patient Details  Name: John Benson. MRN: [id number] Date of Birth: 06-25-1950   Medicare Observation Status Notification Given:  Yes    Alvan Culpepper A Nela Bascom, RN 08/19/2018, 12:15 PM

## 2018-08-19 NOTE — Progress Notes (Signed)
Observed patient ambulating around the unit with RN tech.

## 2018-08-19 NOTE — Progress Notes (Signed)
1 Day Post-Op  Subjective: He is doing well s/p RALP without significant complaints of pain or nausea.  He is tolerating a diet.  Urine is clear with good output.  JP still has moderate drainage with >169ml out since MN.  He has progressive AKI with a Cr of 1.51 today.    ROS:  Review of Systems  Musculoskeletal: Positive for neck pain (stiff from surgery).  All other systems reviewed and are negative.   Anti-infectives: Anti-infectives (From admission, onward)   Start     Dose/Rate Route Frequency Ordered Stop   08/18/18 0627  ceFAZolin (ANCEF) 2-4 GM/100ML-% IVPB    Note to Pharmacy: Ronnell Freshwater   : cabinet override      08/18/18 0627 08/18/18 0749   08/17/18 2309  ceFAZolin (ANCEF) IVPB 2g/100 mL premix     2 g 200 mL/hr over 30 Minutes Intravenous 30 min pre-op 08/17/18 2309 08/18/18 0749      Current Facility-Administered Medications  Medication Dose Route Frequency Provider Last Rate Last Dose  . acetaminophen (TYLENOL) tablet 1,000 mg  1,000 mg Oral Q6H Billey Co, MD   1,000 mg at 08/18/18 1837  . dextrose 5 %-0.45 % sodium chloride infusion   Intravenous Continuous Billey Co, MD   Stopped at 08/19/18 0730  . docusate sodium (COLACE) capsule 100 mg  100 mg Oral BID Nickolas Madrid C, MD   100 mg at 08/18/18 2338  . heparin injection 5,000 Units  5,000 Units Subcutaneous Q8H Billey Co, MD   5,000 Units at 08/19/18 0654  . HYDROcodone-acetaminophen (NORCO/VICODIN) 5-325 MG per tablet 1-2 tablet  1-2 tablet Oral Q4H PRN Billey Co, MD   2 tablet at 08/19/18 0654  . HYDROmorphone (DILAUDID) injection 0.5-1 mg  0.5-1 mg Intravenous Q2H PRN Billey Co, MD   1 mg at 08/18/18 1837  . ondansetron (ZOFRAN) injection 4 mg  4 mg Intravenous Q4H PRN Billey Co, MD      . oxybutynin (DITROPAN-XL) 24 hr tablet 10 mg  10 mg Oral Daily Billey Co, MD         Objective: Vital signs in last 24 hours: Temp:  [97.6 F (36.4 C)-98.7 F (37.1 C)]  98.2 F (36.8 C) (08/15 0449) Pulse Rate:  [63-81] 66 (08/15 0449) Resp:  [14-23] 20 (08/15 0449) BP: (113-167)/(63-86) 113/63 (08/15 0449) SpO2:  [94 %-100 %] 98 % (08/15 0449)  Intake/Output from previous day: 08/14 0701 - 08/15 0700 In: 2328.6 [P.O.:118; I.V.:1910.6; IV Piggyback:300] Out: 2655 [Urine:2150; Drains:305; Blood:200] Intake/Output this shift: No intake/output data recorded.   Physical Exam Vitals signs reviewed.  Constitutional:      Appearance: Normal appearance.  HENT:     Head: Normocephalic and atraumatic.  Cardiovascular:     Rate and Rhythm: Normal rate and regular rhythm.     Heart sounds: Normal heart sounds.  Pulmonary:     Effort: Pulmonary effort is normal. No respiratory distress.     Breath sounds: Normal breath sounds.  Abdominal:     General: Abdomen is flat.     Palpations: Abdomen is soft.     Tenderness: There is right CVA tenderness (mild).     Comments: Incisions intact.  JP in LLQ has serosanguious drainage.   Musculoskeletal: Normal range of motion.        General: No swelling or tenderness.  Skin:    General: Skin is warm and dry.  Neurological:     General: No focal  deficit present.     Mental Status: He is alert and oriented to person, place, and time.  Psychiatric:        Mood and Affect: Mood normal.        Behavior: Behavior normal.     Lab Results:  Recent Labs    08/18/18 1534 08/18/18 1959 08/19/18 0627  WBC 13.4*  --  10.3  HGB 14.4 12.6* 13.0  HCT 43.7 38.7* 39.7  PLT 239  --  231   BMET Recent Labs    08/18/18 1534 08/19/18 0627  NA  --  138  K  --  4.7  CL  --  105  CO2  --  26  GLUCOSE  --  180*  BUN  --  18  CREATININE 1.35* 1.51*  CALCIUM  --  8.6*   PT/INR No results for input(s): LABPROT, INR in the last 72 hours. ABG No results for input(s): PHART, HCO3 in the last 72 hours.  Invalid input(s): PCO2, PO2  Studies/Results: No results found.  Labs reviewed.   Assessment and  Plan: POD#1 from RALP with moderate JP drainage, progressive AKI and mild right flank tenderness on exam.   JP Cr ordered. Renal US ordered to r/o obstruction as cause of AKI. Repeat BMP in am. Consider discharge in AM depending on above.      LOS: 0 days    Irine Seal 08/19/2018 [phone] ID: [name]., male   DOB: [birth year], 68 y.o.   MRN: [id number]

## 2018-08-20 DIAGNOSIS — N179 Acute kidney failure, unspecified: Secondary | ICD-10-CM

## 2018-08-20 DIAGNOSIS — C61 Malignant neoplasm of prostate: Secondary | ICD-10-CM | POA: Diagnosis not present

## 2018-08-20 HISTORY — DX: Acute kidney failure, unspecified: N17.9

## 2018-08-20 LAB — BASIC METABOLIC PANEL
Anion gap: 6 (ref 5–15)
BUN: 19 mg/dL (ref 8–23)
CO2: 24 mmol/L (ref 22–32)
Calcium: 8.7 mg/dL — ABNORMAL LOW (ref 8.9–10.3)
Chloride: 107 mmol/L (ref 98–111)
Creatinine, Ser: 1.24 mg/dL (ref 0.61–1.24)
GFR calc Af Amer: >60 mL/min (ref >60)
GFR calc non Af Amer: >60 mL/min (ref >60)
Glucose, Bld: 164 mg/dL — ABNORMAL HIGH (ref 70–99)
Potassium: 4.2 mmol/L (ref 3.5–5.1)
Sodium: 137 mmol/L (ref 135–145)

## 2018-08-20 NOTE — Discharge Summary (Signed)
Physician Discharge Summary  Patient ID: John Benson. MRN: [id number] DOB/AGE: 1950/06/03 68 y.o.  Admit date: 08/18/2018 Discharge date: 08/20/2018  Admission Diagnoses:  Prostate cancer [facility])  Discharge Diagnoses:  Principal Problem:   Prostate cancer [facility]) Active Problems:   Acute kidney failure [facility])   Past Medical History:  Diagnosis Date  . Benign essential HTN   . Calculus of kidney   . Carpal tunnel syndrome   . Carpal tunnel syndrome 07/15/2014  . Chronic kidney disease, stage II (mild)   . Diabetes mellitus without complication ([place])    controlled by diet and exercise  . History of kidney stones   . HTN (hypertension)    maintained by diet and exercise  . Hyperlipidemia   . Hypogonadism in male   . Obesity     Surgeries: Procedure(s): XI ROBOTIC ASSISTED LAPAROSCOPIC RADICAL PROSTATECTOMY PELVIC LYMPH NODE DISSECTION on 08/18/2018   Consultants (if any):   Discharged Condition: Improved  Hospital Course: John Macke. is an 68 y.o. male who was admitted 08/18/2018 with a diagnosis of Prostate cancer [facility]) and went to the operating room on 08/18/2018 and underwent the above named procedures.  He did well post op but his Cr bumped to 1.51 on POD #1.   His JP drainage Cr was 1.3 and a renal US showed no hydro but I delayed discharge for a day and his Cr has normalized.  JP was removed and he was d/c'd home.    He was given perioperative antibiotics:  Anti-infectives (From admission, onward)   Start     Dose/Rate Route Frequency Ordered Stop   08/18/18 0627  ceFAZolin (ANCEF) 2-4 GM/100ML-% IVPB    Note to Pharmacy: Ronnell Freshwater   : cabinet override      08/18/18 0627 08/18/18 0749   08/17/18 2309  ceFAZolin (ANCEF) IVPB 2g/100 mL premix     2 g 200 mL/hr over 30 Minutes Intravenous 30 min pre-op 08/17/18 2309 08/18/18 0749    .  He was given sequential compression devices and early ambulation for DVT prophylaxis.  He benefited maximally from  the hospital stay and there were no complications.    Recent vital signs:  Vitals:   08/19/18 2001 08/20/18 0602  BP: (!) 147/69 128/76  Pulse: 79 77  Resp: 16 16  Temp: 98.6 F (37 C) 98.4 F (36.9 C)  SpO2: 97% 99%    Recent laboratory studies:  Lab Results  Component Value Date   HGB 13.0 08/19/2018   HGB 12.6 (L) 08/18/2018   HGB 14.4 08/18/2018   Lab Results  Component Value Date   WBC 10.3 08/19/2018   PLT 231 08/19/2018   No results found for: INR Lab Results  Component Value Date   NA 137 08/20/2018   K 4.2 08/20/2018   CL 107 08/20/2018   CO2 24 08/20/2018   BUN 19 08/20/2018   CREATININE 1.24 08/20/2018   GLUCOSE 164 (H) 08/20/2018    Discharge Medications:   Allergies as of 08/20/2018      Reactions   Testosterone Itching, Rash      Medication List    STOP taking these medications   Aspirin Adult Low Strength 81 MG EC tablet Generic drug: aspirin     TAKE these medications   docusate sodium 100 MG capsule Commonly known as: COLACE Take 1 capsule (100 mg total) by mouth 2 (two) times daily.   HYDROcodone-acetaminophen 5-325 MG tablet Commonly known as: NORCO/VICODIN Take 1-2 tablets  by mouth every 4 (four) hours as needed for up to 5 days for moderate pain.   multivitamin Liqd Take 10 mLs by mouth daily.   OMEGA-3 FISH OIL PO Take 1 capsule by mouth daily.   ondansetron 4 MG/2ML Soln injection Commonly known as: ZOFRAN Inject 2 mLs (4 mg total) into the vein every 4 (four) hours as needed for nausea or vomiting.       Diagnostic Studies: US Renal  Result Date: 08/19/2018 CLINICAL DATA:  Acute kidney injury EXAM: RENAL / URINARY TRACT ULTRASOUND COMPLETE COMPARISON:  CT AP 05/26/2018 FINDINGS: Right Kidney: Renal measurements: 10.2 x 4.8 x 5.3 cm = volume: 135 mL. Echogenicity within normal limits. Cyst arising from posterior cortex of the upper pole right kidney measures 1.5 cm and is unchanged from 05/26/2018. Left Kidney: Renal  measurements: 11.0 x 5.4 x 5.4 cm = volume: 171 mL. Echogenicity within normal limits. No mass or hydronephrosis visualized. Bladder: Foley catheter in place. IMPRESSION: 1. No hydronephrosis identified bilaterally. 2. Right upper pole kidney cyst. Electronically Signed   By: Kerby Moors M.D.   On: 08/19/2018 11:47    Disposition: Discharge disposition: 01-Home or Self Care       Discharge Instructions    Discharge instructions   Complete by: As directed    You underwent robotic prostatectomy.  Drink plenty of fluids to keep the bladder flushed, and keep the catheter connected to drainage and empty the bag 1 full.  It is normal to have some pink to red urine in the catheter.  If your catheter is not draining or it is thick red like ketchup, call the urology clinic.  If you have uncontrolled abdominal pain, nausea and vomiting, chest pain, shortness of breath, or fever over 101.3, please present to the emergency department.  No heavy lifting for 3 to 4 weeks, okay to go on walks during the day.  No tub baths for 2 to 3 weeks, okay to shower starting Sunday, August 16.  Wash your incisions very gently with soap and water and pat dry.  Eat small healthy meals throughout the day, and avoid heavy or fried foods.  Follow-up in clinic in 1 week for Foley removal.   Discontinue IV   Complete by: As directed       Follow-up Information    Billey Co, MD Follow up.   Specialty: Urology Why: If not scheduled please call to be seen at the end of the week for catheter removal. Contact information: [place] [address] [phone]            Signed: Irine Seal 08/20/2018, 9:05 AM

## 2018-08-20 NOTE — Discharge Instructions (Signed)
Robot-Assisted Laparoscopic Prostatectomy, Care After This sheet gives you information about how to care for yourself after your procedure. Your health care provider may also give you more specific instructions. If you have problems or questions, contact your health care provider. What can I expect after the procedure? After the procedure, it is common to have:  Mild pain in your lower abdomen.  Blood in your urine for up to 3 weeks.  A need to urinate more often than usual (bladder spasms). This may last for up to 2 weeks. After your catheter is removed, it is common to have:  A burning sensation when you urinate. This may last for up to 2 weeks after your catheter is removed.  Difficulty controlling when you urinate (incontinence). You may leak urine occasionally. Your ability to control when you urinate will improve as you continue to heal. Follow these instructions at home: Medicines  Take over-the-counter and prescription medicines only as told by your health care provider.  Do not drive or use heavy machinery while taking prescription pain medicine.  If you were prescribed an antibiotic medicine, take it as told by your health care provider. Do not stop taking the antibiotic even if you start to feel better.  If you were given a stool softener, use it as told by your health care provider. Incision care and drain care  Follow instructions from your health care provider about how to take care of your incisions. Make sure you: ? Wash your hands with soap and water before you change your bandage (dressing). If soap and water are not available, use hand sanitizer. ? Change your dressing as told by your health care provider. ? Leave stitches (sutures), skin glue, or adhesive strips in place. These skin closures may need to stay in place for 2 weeks or longer. If adhesive strip edges start to loosen and curl up, you may trim the loose edges. Do not remove adhesive strips completely  unless your health care provider tells you to do that.  If you have a drain, follow instructions from your health care provider about how to care for it. Do not remove the drain tube or any dressings around the tube opening unless your health care provider approves.  Check your incision sites and drain area every day for signs of infection. Check for: ? More redness, swelling, or pain. ? More fluid or blood. ? Warmth. ? Pus or a bad smell. Catheter care   Always wash your hands with soap and water before and after touching your penis, your catheter, and your drainage bag. If soap and water are not available, use hand sanitizer.  Empty your drainage bag every 3 hours during the day, or as often as told by your health care provider.  Clean the tip of your penis with soap and water two times a day, or as often as told by your health care provider.  If you were given ointment to apply to the tip of your penis, follow instructions from your health care provider about how to do this.  You will need to visit your health care provider to have your catheter removed. Your catheter may remain in place for up to 3 weeks after your procedure. Activity  Return to your normal activities, including driving, as told by your health care provider. Ask your health care provider what activities are safe for you.  Do not lift anything that is heavier than 10 lb (4.5 kg) until your health care provider says that  you can do this.  Avoid intense physical activity for as long as told by your health care provider.  Walk at least once a day. As you start to feel better, you may start to exercise more. Ask your health care provider what exercises are appropriate for you. Eating and drinking  Follow instructions from your health care provider about eating or drinking restrictions.  Drink enough fluid to keep your urine clear or pale yellow. General instructions  Do not take baths, swim, or use a hot tub until  your health care provider approves.  If your pelvic lymph nodes were removed for testing, it is up to you to get your test results. Ask your health care provider, or the department performing the test, when your results will be ready.  Wear compression stockings as told by your health care provider. These stockings help to prevent blood clots and reduce swelling in your legs.  Follow instructions from your health care provider about when it is safe for you to engage in sexual activity.  Do not strain to have a bowel movement.  Do not use any products that contain nicotine or tobacco, such as cigarettes and e-cigarettes. If you need help quitting, ask your health care provider.  Keep all follow-up visits as told by your health care provider. This is important. Contact a health care provider if:  You have problems with your catheter.  You have bladder spasms more than 3 or 4 times a day.  You become constipated. Symptoms of constipation may include: ? Having fewer than three bowel movements a week. ? Difficulty having a bowel movement. ? Having stools that are hard, dry, or larger than normal. Get help right away if:  You have more redness, swelling, or pain around your incisions, drain area, or the area where the catheter was inserted (catheter insertion site).  You have more fluid or blood coming from your incision area, drain area, or catheter insertion site.  Your incision area, drain area, or catheter insertion site feels warm to the touch.  You have pus or a bad smell coming from your incision area, drain area, or catheter insertion site.  You have a fever.  You have pain that gets worse.  Your catheter falls out.  You are unable to urinate or to pass urine through your catheter.  You have more bright red blood in your urine.  You have blood clots in your urine.  You develop: ? Chest pains. ? Shortness of breath. ? Swelling or pain in your legs. This information  is not intended to replace advice given to you by your health care provider. Make sure you discuss any questions you have with your health care provider. Document Released: 09/15/2011 Document Revised: 12/03/2016 Document Reviewed: 10/03/2015 Elsevier Patient Education  2020 [employer].

## 2018-08-20 NOTE — Progress Notes (Signed)
Educated patient and his wife and supplied with dressing and catheter supplies re:  1. How to switch to leg bag - they wanted to keep the large bag on to go home with  2. How to change the dressings for removed JP site  Both verbalized their understanding and demonstrated understanding. All questions answered. Discharge instructions and paperwork provided. No further questions.

## 2018-08-24 ENCOUNTER — Other Ambulatory Visit: Payer: Self-pay

## 2018-08-24 ENCOUNTER — Ambulatory Visit: Payer: PPO

## 2018-08-24 ENCOUNTER — Other Ambulatory Visit: Payer: Self-pay | Admitting: Pathology

## 2018-08-24 ENCOUNTER — Ambulatory Visit (INDEPENDENT_AMBULATORY_CARE_PROVIDER_SITE_OTHER): Payer: PPO

## 2018-08-24 DIAGNOSIS — C61 Malignant neoplasm of prostate: Secondary | ICD-10-CM

## 2018-08-24 LAB — SURGICAL PATHOLOGY

## 2018-08-24 NOTE — Progress Notes (Signed)
Catheter Removal  Patient is present today for a catheter removal.  38ml of water was drained from the balloon. A 18FR foley cath was removed from the bladder no complications were noted . Patient tolerated well.  Performed by: Gordy Clement, CMA   Follow up/ Additional notes: RTC as scheduled for postop

## 2018-08-30 ENCOUNTER — Ambulatory Visit: Payer: PPO

## 2018-09-07 ENCOUNTER — Ambulatory Visit: Payer: PPO

## 2018-09-14 ENCOUNTER — Other Ambulatory Visit: Payer: PPO

## 2018-09-14 NOTE — Progress Notes (Signed)
Tumor Board Documentation  John Benson. was presented by Dr Erlene Quan at our Tumor Board on 09/14/2018, which included representatives from medical oncology, radiation oncology, pathology, radiology, surgical, surgical oncology, navigation, internal medicine, pharmacy, research, palliative care.  John Benson currently presents as an external consult, for [place], for new positive pathology with history of the following treatments: surgical intervention(s).  Additionally, we reviewed previous medical and familial history, history of present illness, and recent lab results along with all available histopathologic and imaging studies. The tumor board considered available treatment options and made the following recommendations: Radiation therapy (primary modality) Androgen Deprevation Therapy  The following procedures/referrals were also placed: No orders of the defined types were placed in this encounter.   Clinical Trial Status: not discussed   Staging used: AJCC Stage Group  AJCC Staging: T: 3a N: 0   Group: Prostate Cancer   National site-specific guidelines NCCN were discussed with respect to the case.  Tumor board is a meeting of clinicians from various specialty areas who evaluate and discuss patients for whom a multidisciplinary approach is being considered. Final determinations in the plan of care are those of the provider(s). The responsibility for follow up of recommendations given during tumor board is that of the provider.   Today's extended care, comprehensive team conference, John Benson was not present for the discussion and was not examined.   Multidisciplinary Tumor Board is a multidisciplinary case peer review process.  Decisions discussed in the Multidisciplinary Tumor Board reflect the opinions of the specialists present at the conference without having examined the patient.  Ultimately, treatment and diagnostic decisions rest with the primary provider(s) and the patient.

## 2018-09-18 ENCOUNTER — Encounter: Payer: Self-pay | Admitting: Urology

## 2018-09-18 ENCOUNTER — Ambulatory Visit (INDEPENDENT_AMBULATORY_CARE_PROVIDER_SITE_OTHER): Payer: PPO | Admitting: Urology

## 2018-09-18 ENCOUNTER — Other Ambulatory Visit: Payer: Self-pay

## 2018-09-18 VITALS — BP 133/74 | HR 88 | Ht 69.0 in | Wt 229.0 lb

## 2018-09-18 DIAGNOSIS — C61 Malignant neoplasm of prostate: Secondary | ICD-10-CM

## 2018-09-18 NOTE — Progress Notes (Signed)
   09/18/2018 11:36 AM   [name]. 02-09-1950 [id number]  Reason for visit: Follow up robotic prostatectomy  HPI: I saw Mr. Beno back in urology clinic for follow-up after going robotic prostatectomy and bilateral lymphadenectomy on 08/18/2018.  He is a 68 year old healthy African-American male who had a long history of rising PSAs before finally undergoing biopsy on 05/18/2018 for an elevated PSA of 20.  He then underwent a robotic prostatectomy and bilateral lymphadenectomy on 08/18/2018 with final pathology showing no lymph node involvement, however Gleason score 4+5 = 9 pT3a prostate adenocarcinoma with extraprostatic extension, lymphovascular invasion, perineural invasion, and positive margins at the left apex, and bilaterally.  Carcinoma was present within the vascular bundles outside the muscular layer of the right SV.  He is recovered well from surgery and denies any significant complaints today.  He is tolerating a normal diet and having regular bowel movements.  His incisions of healed well.  He is having minimal urinary incontinence with leakage of a few drops to a teaspoon when he laughs, coughs, or sneezes.  We discussed his high risk pathology at length today, and my recommendation for radiation with his high risk disease.  We also discussed the need for ongoing close PSA monitoring.   ROS: Please see flowsheet from today's date for complete review of systems.  Physical Exam: BP 133/74   Pulse 88   Ht 5\' 9"  (1.753 m)   Wt 229 lb (103.9 kg)   BMI 33.82 kg/m    Constitutional:  Alert and oriented, No acute distress. Respiratory: Normal respiratory effort, no increased work of breathing. GI: Abdomen is soft, nontender, nondistended, no abdominal masses.  Incisions well-healed. Skin: No rashes, bruises or suspicious lesions. Neurologic: Grossly intact, no focal deficits, moving all 4 extremities. Psychiatric: Normal mood and affect  Assessment & Plan:   In summary,  the patient is a 67 year old male with high risk prostate cancer status post robotic prostatectomy and bilateral lymphadenectomy on 08/18/2018 with final pathology showing Gleason score 4+5 = 9 prostate adenocarcinoma stage pT3a with positive margins.  -Referral placed to radiation oncology for consideration of adjuvant therapy -Kegel exercises for stress incontinence -PSA in 4 weeks, will call with results, and RTC with me in 4 months with repeat PSA  A total of 15 minutes were spent face-to-face with the patient, greater than 50% was spent in patient education, counseling, and coordination of care regarding prostate cancer and follow-up plan.   Billey Co, [facility] [address] [place], [place] [address] [phone]

## 2018-09-18 NOTE — Patient Instructions (Signed)

## 2018-09-21 ENCOUNTER — Ambulatory Visit: Payer: PPO | Admitting: Podiatry

## 2018-09-21 ENCOUNTER — Other Ambulatory Visit: Payer: Self-pay

## 2018-09-21 ENCOUNTER — Encounter: Payer: Self-pay | Admitting: Podiatry

## 2018-09-21 DIAGNOSIS — B351 Tinea unguium: Secondary | ICD-10-CM | POA: Diagnosis not present

## 2018-09-21 DIAGNOSIS — M79674 Pain in right toe(s): Secondary | ICD-10-CM | POA: Diagnosis not present

## 2018-09-21 DIAGNOSIS — E0843 Diabetes mellitus due to underlying condition with diabetic autonomic (poly)neuropathy: Secondary | ICD-10-CM

## 2018-09-21 DIAGNOSIS — M79675 Pain in left toe(s): Secondary | ICD-10-CM | POA: Diagnosis not present

## 2018-09-21 NOTE — Progress Notes (Signed)
Complaint:  Visit Type: Patient returns to my office for continued preventative foot care services. Complaint: Patient states" my nails have grown long and thick and become painful to walk and wear shoes" Patient has been diagnosed with DM with no foot complications. The patient presents for preventative foot care services. No changes to ROS  Podiatric Exam: Vascular: dorsalis pedis and posterior tibial pulses are palpable bilateral. Capillary return is immediate. Temperature gradient is WNL. Skin turgor WNL  Sensorium: Diminished  Semmes Weinstein monofilament test. Normal tactile sensation bilaterally. Nail Exam: Pt has thick disfigured discolored nails with subungual debris noted bilateral entire nail hallux through fifth toenails Ulcer Exam: There is no evidence of ulcer or pre-ulcerative changes or infection. Orthopedic Exam: Muscle tone and strength are WNL. No limitations in general ROM. No crepitus or effusions noted. Foot type and digits show no abnormalities. Bony prominences are unremarkable. Skin: No Porokeratosis. No infection or ulcers  Diagnosis:  Onychomycosis, , Pain in right toe, pain in left toes  Treatment & Plan Procedures and Treatment: Consent by patient was obtained for treatment procedures.   Debridement of mycotic and hypertrophic toenails, 1 through 5 bilateral and clearing of subungual debris. No ulceration, no infection noted.  Return Visit-Office Procedure: Patient instructed to return to the office for a follow up visit 3 months for continued evaluation and treatment.    Miosha Behe DPM 

## 2018-09-25 ENCOUNTER — Ambulatory Visit: Payer: PPO | Admitting: Urology

## 2018-10-19 ENCOUNTER — Other Ambulatory Visit: Payer: PPO

## 2018-10-19 ENCOUNTER — Other Ambulatory Visit: Payer: Self-pay

## 2018-10-19 DIAGNOSIS — C61 Malignant neoplasm of prostate: Secondary | ICD-10-CM

## 2018-10-20 LAB — PSA: Prostate Specific Ag, Serum: <0.1 ng/mL (ref 0.0–4.0)

## 2018-10-23 ENCOUNTER — Telehealth: Payer: Self-pay

## 2018-10-23 NOTE — Telephone Encounter (Signed)
Mychart sent.

## 2018-10-23 NOTE — Telephone Encounter (Signed)
-----   Message from Billey Co, MD sent at 10/23/2018 10:57 AM EDT ----- Doristine Devoid news, psa is 0 after prostate removal. Keep follow up in January with PSA prior  Nickolas Madrid, MD 10/23/2018

## 2018-12-20 DIAGNOSIS — Z03818 Encounter for observation for suspected exposure to other biological agents ruled out: Secondary | ICD-10-CM | POA: Diagnosis not present

## 2018-12-20 DIAGNOSIS — Z20828 Contact with and (suspected) exposure to other viral communicable diseases: Secondary | ICD-10-CM | POA: Diagnosis not present

## 2018-12-21 ENCOUNTER — Ambulatory Visit: Payer: PPO | Admitting: Podiatry

## 2019-01-16 ENCOUNTER — Other Ambulatory Visit: Payer: Self-pay

## 2019-01-16 ENCOUNTER — Other Ambulatory Visit: Payer: PPO

## 2019-01-16 DIAGNOSIS — C61 Malignant neoplasm of prostate: Secondary | ICD-10-CM

## 2019-01-16 NOTE — Addendum Note (Signed)
Addended by: Evelina Bucy on: 01/16/2019 08:37 AM   Modules accepted: Orders

## 2019-01-17 LAB — PSA: Prostate Specific Ag, Serum: 0.2 ng/mL (ref 0.0–4.0)

## 2019-01-18 ENCOUNTER — Encounter: Payer: Self-pay | Admitting: Urology

## 2019-01-18 ENCOUNTER — Other Ambulatory Visit: Payer: Self-pay

## 2019-01-18 ENCOUNTER — Ambulatory Visit: Payer: PPO | Admitting: Urology

## 2019-01-18 VITALS — BP 148/79 | HR 66 | Ht 69.0 in | Wt 236.0 lb

## 2019-01-18 DIAGNOSIS — C61 Malignant neoplasm of prostate: Secondary | ICD-10-CM | POA: Diagnosis not present

## 2019-01-18 NOTE — Patient Instructions (Signed)

## 2019-01-18 NOTE — Addendum Note (Signed)
Addended by: Verlene Mayer A on: 01/18/2019 01:18 PM   Modules accepted: Orders

## 2019-01-18 NOTE — Progress Notes (Signed)
   01/18/2019 10:23 AM   [name]. [birth year] [id number]  Reason for visit: Follow up prostate cancer status post robotic prostatectomy  HPI: I saw Mr. Ostertag back in urology clinic for follow-up after going robotic prostatectomy and bilateral lymphadenectomy on 08/18/2018.  He is a 69 year old healthy African-American male who had a long history of rising PSAs before finally undergoing biopsy on 05/18/2018 for an elevated PSA of 20.  He then underwent a robotic prostatectomy and bilateral lymphadenectomy on 08/18/2018 with final pathology showing no lymph node involvement, however Gleason score 4+5 = 9 pT3a prostate adenocarcinoma with extraprostatic extension, lymphovascular invasion, perineural invasion, and positive margins at the left apex, and bilaterally.  Carcinoma was present within the vascular bundles outside the muscular layer of the right SV.  He is still having minimal urinary incontinence with leakage of a few drops to a teaspoon when he laughs, coughs, or sneezes.  He is otherwise voiding with a strong stream and denies any gross hematuria or dysuria.  At our last visit, I had placed a referral to radiation oncology for consideration of adjuvant radiation with his high risk pathology, however he did not follow-up with them.  We reviewed his PSA value of 0.2 today which, if confirmed on repeat PSA, would be indicative of biochemical recurrence.  Again, this is not surprising with his pre-op PSA of 20 and high risk pathology with positive margins.  I reviewed his pathology again at length with him today and my recommendation for salvage radiation with his high risk disease, positive margins, and PSA of 0.2.  Finally, we reviewed Kegel exercises as a strategy for helping to improve his stress urinary incontinence, and we discussed this continues to improve over the first year after surgery.  In summary, the patient is a 69 year old male with high risk prostate cancer status post  robotic prostatectomy and bilateral lymphadenectomy on 08/18/2018 with final pathology showing Gleason score 4+5 = 9 prostate adenocarcinoma stage pT3a with positive margins.  His initial post-op PSA was undetectable, however PSA 01/15/2018 is 0.2 worrisome for biochemical recurrence.  Repeat PSA today, call with results Referral again placed to radiation oncology for consideration of salvage radiation, and I stressed the importance of him following up with Dr. Baruch Gouty RTC 6 months for symptom check after completing radiation  A total of 30 minutes were spent face-to-face with the patient, greater than 50% was spent in patient education, counseling, and coordination of care regarding prostate cancer and biochemical recurrence.  Billey Co, [facility] [address] [place], [place] [address] [phone]

## 2019-01-19 ENCOUNTER — Telehealth: Payer: Self-pay

## 2019-01-19 LAB — PSA: Prostate Specific Ag, Serum: 0.2 ng/mL (ref 0.0–4.0)

## 2019-01-19 NOTE — Telephone Encounter (Signed)
-----   Message from Billey Co, MD sent at 01/19/2019  2:04 PM EST ----- Regarding: PSA PSA still 0.2 after prostate surgery, he needs to see Dr. Baruch Gouty in radiation oncology as we discussed in clinic this week, thanks  Nickolas Madrid, MD 01/19/2019  ----- Message ----- From: Lavone Neri Lab Results In Sent: 01/19/2019   5:38 AM EST To: Billey Co, MD

## 2019-01-19 NOTE — Telephone Encounter (Signed)
Called pt no answer. LM for pt informing him of information below. Advised pt to call back for questions or concerns.

## 2019-01-25 ENCOUNTER — Telehealth: Payer: Self-pay

## 2019-01-25 NOTE — Telephone Encounter (Signed)
Pt returned your call and would like for you to call him back.

## 2019-01-25 NOTE — Telephone Encounter (Signed)
Called pt back as he returned my call,  no answer.

## 2019-01-26 ENCOUNTER — Other Ambulatory Visit: Payer: Self-pay

## 2019-01-26 ENCOUNTER — Encounter: Payer: Self-pay | Admitting: Radiation Oncology

## 2019-01-26 NOTE — Telephone Encounter (Signed)
Called pt no answer. Will sent mychart

## 2019-01-26 NOTE — Progress Notes (Signed)
Patient pre screened for office appointment, no questions or concerns today. Patient reminded of upcoming appointment time and date. Patient unavailable wife supplied information for screening.

## 2019-01-29 ENCOUNTER — Ambulatory Visit: Payer: PPO | Admitting: Family Medicine

## 2019-01-29 ENCOUNTER — Ambulatory Visit
Admission: RE | Admit: 2019-01-29 | Discharge: 2019-01-29 | Disposition: A | Discharge status: Home / Self Care | Payer: PPO | Source: Ambulatory Visit | Attending: Radiation Oncology | Admitting: Radiation Oncology

## 2019-01-29 ENCOUNTER — Other Ambulatory Visit: Payer: Self-pay

## 2019-01-29 VITALS — BP 152/80 | HR 64 | Temp 97.2°F | Resp 16 | Wt 238.7 lb

## 2019-01-29 DIAGNOSIS — C61 Malignant neoplasm of prostate: Secondary | ICD-10-CM

## 2019-01-29 DIAGNOSIS — E119 Type 2 diabetes mellitus without complications: Secondary | ICD-10-CM | POA: Diagnosis not present

## 2019-01-29 NOTE — Progress Notes (Signed)
Radiation Oncology Follow up Note  Name: John Benson.   Date:   01/29/2019 MRN:  [id number] DOB: 1950/03/26    This 70 y.o. male presents to the clinic today for reevaluation status post robotic assisted prostatectomy for Gleason 9 (4+5) adenocarcinoma the prostate with positive margins.  Oestreich extraprostatic extension and lymphovascular and perineural invasion with rising PSA  REFERRING PROVIDER: Hubbard Hartshorn, FNP  HPI: Patient is a 69 year old male originally consulted back in.  June 2020 when he presented with stage IIb (T1 cN0 M0) Gleason 8 (5+3) adenocarcinoma the prostate presenting with a PSA of 20.  He went on to have a robotic assisted prostatectomy showing Gleason 9 (4+5) with extraprostatic extension present as well as lymphovascular and perineural invasion.  2 lymph nodes were negative for metastatic disease.  He is tolerated her surgery well still has some slight leaking wears a pad.  His PSA is also climbed 2.2 from undetectable.  Based on positive margins lymph vascular perineural invasion and rising PSA he has been referred to radiation oncology for consideration of salvage.  He is having no bowel issues at this time.  His initial bone scan and CT scan of abdomen pelvis showed no evidence of extracapsular extension pelvic lymphadenopathy or bone metastasis.  COMPLICATIONS OF TREATMENT: none  FOLLOW UP COMPLIANCE: keeps appointments   PHYSICAL EXAM:  BP (!) 152/80   Pulse 64   Temp (!) 97.2 F (36.2 C)   Resp 16   Wt 238 lb 11.2 oz (108.3 kg)   SpO2 98%   BMI 35.25 kg/m  Well-developed well-nourished patient in NAD. HEENT reveals PERLA, EOMI, discs not visualized.  Oral cavity is clear. No oral mucosal lesions are identified. Neck is clear without evidence of cervical or supraclavicular adenopathy. Lungs are clear to A&P. Cardiac examination is essentially unremarkable with regular rate and rhythm without murmur rub or thrill. Abdomen is benign with no  organomegaly or masses noted. Motor sensory and DTR levels are equal and symmetric in the upper and lower extremities. Cranial nerves II through XII are grossly intact. Proprioception is intact. No peripheral adenopathy or edema is identified. No motor or sensory levels are noted. Crude visual fields are within normal range.  RADIOLOGY RESULTS: CT scan and bone scan both reviewed compatible with above-stated findings  PLAN: At this time I have recommended salvage radiation therapy.  I would plan on delivering 7600 cGy over 8 weeks to his prostatic fossa.  I also would treat his pelvic lymph nodes to 5400 cGy using IMRT treatment planning and delivery.  Risks and benefits of treatment including increased lower urinary tract symptoms possible worsening of his urinary incontinence fatigue alteration of blood count skin reaction possible diarrhea all were explained in detail to the patient.  He seems to comprehend my treatment plan well.  Patient also will benefit from Lupron or ND androgen deprivation therapy for the next 6 months during treatment.  We will arrange that through Dr. Cheri Rous office.  I have personally set up and ordered CT simulation for later this week.  I would like to take this opportunity to thank you for allowing me to participate in the care of your patient.Noreene Filbert, MD

## 2019-01-30 ENCOUNTER — Encounter: Payer: Self-pay | Admitting: Family Medicine

## 2019-01-30 ENCOUNTER — Ambulatory Visit (INDEPENDENT_AMBULATORY_CARE_PROVIDER_SITE_OTHER): Payer: PPO | Admitting: Family Medicine

## 2019-01-30 VITALS — BP 130/86 | HR 66 | Temp 97.1°F | Resp 16 | Ht 69.5 in | Wt 232.2 lb

## 2019-01-30 DIAGNOSIS — N182 Chronic kidney disease, stage 2 (mild): Secondary | ICD-10-CM

## 2019-01-30 DIAGNOSIS — E119 Type 2 diabetes mellitus without complications: Secondary | ICD-10-CM

## 2019-01-30 DIAGNOSIS — E559 Vitamin D deficiency, unspecified: Secondary | ICD-10-CM | POA: Diagnosis not present

## 2019-01-30 DIAGNOSIS — E7800 Pure hypercholesterolemia, unspecified: Secondary | ICD-10-CM | POA: Diagnosis not present

## 2019-01-30 DIAGNOSIS — Z5181 Encounter for therapeutic drug level monitoring: Secondary | ICD-10-CM | POA: Diagnosis not present

## 2019-01-30 NOTE — Progress Notes (Signed)
Name: John Benson.   MRN: [id number]    DOB: 08-Aug-1950   Date:01/30/2019       Progress Note  No chief complaint on file.    Subjective:   John Benson. is a 69 y.o. male, presents to clinic for routine follow up on the conditions listed above.  F/up on multipile conditions most are tx and managed with lifestyle, no meds.    DM - off meds for over a year, last A1C was 07/2018, about 6 months ago and was at goal at 6.6 with lifestyle and diet efforts.  He has not been working on his diet or exercising as much as he was able to 6 months ago.  He denies polyuria, polydipsia, pholyphagia, unintensional weight loss (states he has gained some weight) fatigue.     HLD -  On fish oil, reviewed past labs, cholesterol has been very high for at least 3-4 years.  Last labs total cholesterol 229, LDL 145.  Some labs in the past were improved compared to current labs, previously took lipitor 10 mg and 20 mg and lovaza.  No allergy to statin documented.   Prostate cancer - pt recently had radical prostatectomy (August 2020) about 5 months ago, now going to do radiation therapy He states he has recovered well, is up to jogging or walking a mile a day.     Patient Active Problem List   Diagnosis Date Noted  . Acute kidney failure ([place]) 08/20/2018  . Pain due to onychomycosis of toenails of both feet 06/15/2018  . Obesity (BMI 30.0-34.9) 07/27/2017  . Tinea pedis 04/27/2017  . Chronic kidney disease, stage II (mild) 04/27/2017  . Vitamin D deficiency 07/22/2016  . 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 HLD (hyperlipidemia) 07/15/2014  . Male hypogonadism 07/15/2014  . Spermatocele 07/15/2014  . Diabetes mellitus without complication ([place]) [id number]  . Prostate cancer ([place]) 06/24/2014  . Calculus of kidney 10/08/2012  . Decreased libido 05/22/2008    Past Surgical History:  Procedure Laterality Date  . COLONOSCOPY    . LITHOTRIPSY    . PELVIC LYMPH NODE DISSECTION Bilateral  08/18/2018   Procedure: PELVIC LYMPH NODE DISSECTION;  Surgeon: Billey Co, MD;  Location: [facility] ORS;  Service: Urology;  Laterality: Bilateral;  . ROBOT ASSISTED LAPAROSCOPIC RADICAL PROSTATECTOMY N/A 08/18/2018   Procedure: XI ROBOTIC ASSISTED LAPAROSCOPIC RADICAL PROSTATECTOMY;  Surgeon: Billey Co, MD;  Location: [facility] ORS;  Service: Urology;  Laterality: N/A;    Family History  Problem Relation Age of Onset  . Diabetes Mother   . Cancer Mother        breast  . Vision loss Mother   . Hypertension Father   . Dementia Father   . Diabetes Brother   . Early death Brother   . Pancreatic disease Brother   . Early death Sister   . Kidney disease Sister   . Kidney cancer Neg Hx   . Prostate cancer Neg Hx     Social History   Socioeconomic History  . Marital status: Married    Spouse name: Not on file  . Number of children: Not on file  . Years of education: Not on file  . Highest education level: Not on file  Occupational History  . Not on file  Tobacco Use  . Smoking status: Never Smoker  . Smokeless tobacco: Never Used  Substance and Sexual Activity  . Alcohol use: No    Alcohol/week: 0.0 standard drinks  .  Drug use: No  . Sexual activity: Yes  Other Topics Concern  . Not on file  Social History Narrative  . Not on file   Social Determinants of Health   Financial Resource Strain: Low Risk   . Difficulty of Paying Living Expenses: Not hard at all  Food Insecurity: No Food Insecurity  . Worried About Charity fundraiser in the Last Year: Never true  . Ran Out of Food in the Last Year: Never true  Transportation Needs: No Transportation Needs  . Lack of Transportation (Medical): No  . Lack of Transportation (Non-Medical): No  Physical Activity: Sufficiently Active  . Days of Exercise per Week: 6 days  . Minutes of Exercise per Session: 60 min  Stress: No Stress Concern Present  . Feeling of Stress : Not at all  Social Connections: Slightly Isolated  .  Frequency of Communication with Friends and Family: More than three times a week  . Frequency of Social Gatherings with Friends and Family: Three times a week  . Attends Religious Services: More than 4 times per year  . Active Member of Clubs or Organizations: No  . Attends Archivist Meetings: Never  . Marital Status: Married  Human resources officer Violence: Not At Risk  . Fear of Current or Ex-Partner: No  . Emotionally Abused: No  . Physically Abused: No  . Sexually Abused: No     Current Outpatient Medications:  .  BAYER ASPIRIN EC LOW DOSE 81 MG EC tablet, , Disp: , Rfl:  .  COLACE 100 MG capsule, , Disp: , Rfl:  .  Multiple Vitamin (MULTIVITAMIN) LIQD, Take 10 mLs by mouth daily., Disp: , Rfl:  .  Omega-3 Fatty Acids (OMEGA-3 FISH OIL PO), Take 1 capsule by mouth daily., Disp: , Rfl:   Allergies  Allergen Reactions  . Testosterone Itching and Rash    Chart Review Today: I personally reviewed active problem list, medication list, allergies, family history, social history, health maintenance, notes from last encounter, lab results, imaging with the patient/caregiver today.   Review of Systems  Constitutional: Negative.   HENT: Negative.   Eyes: Negative.   Respiratory: Negative.   Cardiovascular: Negative.   Gastrointestinal: Negative.   Endocrine: Negative.   Genitourinary: Negative.   Musculoskeletal: Negative.   Skin: Negative.   Allergic/Immunologic: Negative.   Neurological: Negative.   Hematological: Negative.   Psychiatric/Behavioral: Negative.   All other systems reviewed and are negative.      Objective:    Vitals:   01/30/19 1011  BP: 130/86  Pulse: 66  Resp: 16  Temp: (!) 97.1 F (36.2 C)  TempSrc: Temporal  SpO2: 98%  Weight: 232 lb 3.2 oz (105.3 kg)  Height: 5' 9.5" (1.765 m)    Body mass index is 33.8 kg/m.  Physical Exam Vitals and nursing note reviewed.  Constitutional:      General: He is not in acute distress.     Appearance: Normal appearance. He is well-developed. He is obese. He is not ill-appearing, toxic-appearing or diaphoretic.     Interventions: Face mask in place.  HENT:     Head: Normocephalic and atraumatic.     Jaw: No trismus.     Right Ear: External ear normal.     Left Ear: External ear normal.  Eyes:     General: Lids are normal. No scleral icterus.    Conjunctiva/sclera: Conjunctivae normal.     Pupils: Pupils are equal, round, and reactive to light.  Neck:  Trachea: Trachea and phonation normal. No tracheal deviation.  Cardiovascular:     Rate and Rhythm: Normal rate and regular rhythm.     Pulses: Normal pulses.          Radial pulses are 2+ on the right side and 2+ on the left side.       Posterior tibial pulses are 2+ on the right side and 2+ on the left side.     Heart sounds: Normal heart sounds. No murmur. No friction rub. No gallop.   Pulmonary:     Effort: Pulmonary effort is normal. No respiratory distress.     Breath sounds: Normal breath sounds. No stridor. No wheezing, rhonchi or rales.  Abdominal:     General: Bowel sounds are normal. There is no distension.     Palpations: Abdomen is soft.     Tenderness: There is no abdominal tenderness. There is no guarding or rebound.  Musculoskeletal:        General: Normal range of motion.     Cervical back: Normal range of motion and neck supple.     Right lower leg: No edema.     Left lower leg: No edema.  Skin:    General: Skin is warm and dry.     Capillary Refill: Capillary refill takes less than 2 seconds.     Coloration: Skin is not jaundiced.     Findings: No rash.     Nails: There is no clubbing.  Neurological:     Mental Status: He is alert.     Cranial Nerves: No dysarthria or facial asymmetry.     Motor: No tremor or abnormal muscle tone.     Gait: Gait normal.  Psychiatric:        Mood and Affect: Mood normal.        Speech: Speech normal.        Behavior: Behavior normal. Behavior is  cooperative.       Diabetic Foot Exam: Diabetic Foot Exam - Simple   No data filed      PHQ2/9: Depression screen [facility] 2/9 01/30/2019 07/27/2018 05/25/2018 03/17/2018 02/02/2018  Decreased Interest 0 0 0 0 0  Down, Depressed, Hopeless 0 0 0 0 0  PHQ - 2 Score 0 0 0 0 0  Altered sleeping 0 0 0 0 0  Tired, decreased energy 0 0 0 0 0  Change in appetite 0 0 0 0 0  Feeling bad or failure about yourself  0 0 0 0 0  Trouble concentrating 0 0 0 0 0  Moving slowly or fidgety/restless 0 0 0 0 0  Suicidal thoughts 0 0 0 0 0  PHQ-9 Score 0 0 0 0 0  Difficult doing work/chores Not difficult at all Not difficult at all Not difficult at all Not difficult at all Not difficult at all  Some recent data might be hidden    phq 9 is neg, reviewed today  Fall Risk: Fall Risk  01/30/2019 07/27/2018 07/27/2018 07/27/2018 07/27/2018  Falls in the past year? 0 0 0 0 0  Number falls in past yr: 0 0 - 0 -  Injury with Fall? 0 0 - 0 0  Follow up - Falls evaluation completed - Falls evaluation completed Falls evaluation completed    Functional Status Survey: Is the patient deaf or have difficulty hearing?: No Does the patient have difficulty seeing, even when wearing glasses/contacts?: No Does the patient have difficulty concentrating, remembering, or making decisions?: No Does the  patient have difficulty walking or climbing stairs?: No Does the patient have difficulty dressing or bathing?: No Does the patient have difficulty doing errands alone such as visiting a doctor's office or shopping?: No   Assessment & Plan:     ICD-10-CM   1. Diabetes mellitus without complication ([name])  [id number] Hemoglobin A1c    COMPLETE METABOLIC PANEL WITH GFR   recheck labs, managed with diet and lifestyle, has been stable off meds for .ast 1-1.5 years, some weight gain   2. Pure hypercholesterolemia  E78.00 Lipid panel    COMPLETE METABOLIC PANEL WITH GFR   ASCVD risk very high with last labs, discussed at length with pt  today, risk factors and score and indication for statin med if labs remain similarly high  3. Chronic kidney disease, stage II (mild)  99991111 COMPLETE METABOLIC PANEL WITH GFR   recheck renal function  4. Hypocalcemia  123456 COMPLETE METABOLIC PANEL WITH GFR   last labs show low calcium, recheck  5. Vitamin D deficiency  E55.9 VITAMIN D 25 Hydroxy (Vit-D Deficiency, Fractures)   recheck levels  6. Encounter for medication monitoring  Z51.81 Lipid panel    Hemoglobin A1c    COMPLETE METABOLIC PANEL WITH GFR    CBC with Differential/Platelet    VITAMIN D 25 Hydroxy (Vit-D Deficiency, Fractures)     No follow-ups on file.   Delsa Grana, PA-C 01/30/19 10:36 AM

## 2019-01-30 NOTE — Patient Instructions (Signed)
The 10-year ASCVD risk score Mikey Bussing DC Brooke Bonito., et al., 2013) is: 19.4%   Values used to calculate the score:     Age: 69 years     Sex: Male     Is Non-Hispanic African American: Yes     Diabetic: Yes     Tobacco smoker: No     Systolic Blood Pressure: [id number] mmHg     Is BP treated: No     HDL Cholesterol: 54 mg/dL     Total Cholesterol: 229 mg/dL    High Cholesterol  High cholesterol is a condition in which the blood has high levels of a white, waxy, fat-like substance (cholesterol). The human body needs small amounts of cholesterol. The liver makes all the cholesterol that the body needs. Extra (excess) cholesterol comes from the food that we eat. Cholesterol is carried from the liver by the blood through the blood vessels. If you have high cholesterol, deposits (plaques) may build up on the walls of your blood vessels (arteries). Plaques make the arteries narrower and stiffer. Cholesterol plaques increase your risk for heart attack and stroke. Work with your health care provider to keep your cholesterol levels in a healthy range. What increases the risk? This condition is more likely to develop in people who:  Eat foods that are high in animal fat (saturated fat) or cholesterol.  Are overweight.  Are not getting enough exercise.  Have a family history of high cholesterol. What are the signs or symptoms? There are no symptoms of this condition. How is this diagnosed? This condition may be diagnosed from the results of a blood test.  If you are older than age 57, your health care provider may check your cholesterol every 4-6 years.  You may be checked more often if you already have high cholesterol or other risk factors for heart disease. The blood test for cholesterol measures:  "Bad" cholesterol (LDL cholesterol). This is the main type of cholesterol that causes heart disease. The desired level for LDL is less than 100.  "Good" cholesterol (HDL cholesterol). This type helps to  protect against heart disease by cleaning the arteries and carrying the LDL away. The desired level for HDL is 60 or higher.  Triglycerides. These are fats that the body can store or burn for energy. The desired number for triglycerides is lower than 150.  Total cholesterol. This is a measure of the total amount of cholesterol in your blood, including LDL cholesterol, HDL cholesterol, and triglycerides. A healthy number is less than 200. How is this treated? This condition is treated with diet changes, lifestyle changes, and medicines. Diet changes  This may include eating more whole grains, fruits, vegetables, nuts, and fish.  This may also include cutting back on red meat and foods that have a lot of added sugar. Lifestyle changes  Changes may include getting at least 40 minutes of aerobic exercise 3 times a week. Aerobic exercises include walking, biking, and swimming. Aerobic exercise along with a healthy diet can help you maintain a healthy weight.  Changes may also include quitting smoking. Medicines  Medicines are usually given if diet and lifestyle changes have failed to reduce your cholesterol to healthy levels.  Your health care provider may prescribe a statin medicine. Statin medicines have been shown to reduce cholesterol, which can reduce the risk of heart disease. Follow these instructions at home: Eating and drinking If told by your health care provider:  Eat chicken (without skin), fish, veal, shellfish, ground [place] breast,  and round or loin cuts of red meat.  Do not eat fried foods or fatty meats, such as hot dogs and salami.  Eat plenty of fruits, such as apples.  Eat plenty of vegetables, such as broccoli, potatoes, and carrots.  Eat beans, peas, and lentils.  Eat grains such as barley, rice, couscous, and bulgur wheat.  Eat pasta without cream sauces.  Use skim or nonfat milk, and eat low-fat or nonfat yogurt and cheeses.  Do not eat or drink whole  milk, cream, ice cream, egg yolks, or hard cheeses.  Do not eat stick margarine or tub margarines that contain trans fats (also called partially hydrogenated oils).  Do not eat saturated tropical oils, such as coconut oil and palm oil.  Do not eat cakes, cookies, crackers, or other baked goods that contain trans fats.  General instructions  Exercise as directed by your health care provider. Increase your activity level with activities such as gardening, walking, and taking the stairs.  Take over-the-counter and prescription medicines only as told by your health care provider.  Do not use any products that contain nicotine or tobacco, such as cigarettes and e-cigarettes. If you need help quitting, ask your health care provider.  Keep all follow-up visits as told by your health care provider. This is important. Contact a health care provider if:  You are struggling to maintain a healthy diet or weight.  You need help to start on an exercise program.  You need help to stop smoking. Get help right away if:  You have chest pain.  You have trouble breathing. This information is not intended to replace advice given to you by your health care provider. Make sure you discuss any questions you have with your health care provider. Document Revised: 12/24/2016 Document Reviewed: 06/21/2015 Elsevier Patient Education  [place].

## 2019-01-31 ENCOUNTER — Telehealth: Payer: Self-pay

## 2019-01-31 ENCOUNTER — Other Ambulatory Visit: Payer: Self-pay

## 2019-01-31 ENCOUNTER — Ambulatory Visit
Admission: RE | Admit: 2019-01-31 | Discharge: 2019-01-31 | Disposition: A | Discharge status: Home / Self Care | Payer: PPO | Source: Ambulatory Visit | Attending: Radiation Oncology | Admitting: Radiation Oncology

## 2019-01-31 DIAGNOSIS — C61 Malignant neoplasm of prostate: Secondary | ICD-10-CM | POA: Diagnosis not present

## 2019-01-31 LAB — CBC WITH DIFFERENTIAL/PLATELET
Absolute Monocytes: 780 cells/uL (ref 200–950)
Basophils Absolute: 131 cells/uL (ref 0–200)
Basophils Relative: 1.9 %
Eosinophils Absolute: 228 cells/uL (ref 15–500)
Eosinophils Relative: 3.3 %
HCT: 45 % (ref 38.5–50.0)
Hemoglobin: 14.7 g/dL (ref 13.2–17.1)
Lymphs Abs: 1835 cells/uL (ref 850–3900)
MCH: 26.2 pg — ABNORMAL LOW (ref 27.0–33.0)
MCHC: 32.7 g/dL (ref 32.0–36.0)
MCV: 80.1 fL (ref 80.0–100.0)
MPV: 9.5 fL (ref 7.5–12.5)
Monocytes Relative: 11.3 %
Neutro Abs: 3926 cells/uL (ref 1500–7800)
Neutrophils Relative %: 56.9 %
Platelets: 261 10*3/uL (ref 140–400)
RBC: 5.62 10*6/uL (ref 4.20–5.80)
RDW: 13.7 % (ref 11.0–15.0)
Total Lymphocyte: 26.6 %
WBC: 6.9 10*3/uL (ref 3.8–10.8)

## 2019-01-31 LAB — COMPLETE METABOLIC PANEL WITH GFR
AG Ratio: 1.6 (calc) (ref 1.0–2.5)
ALT: 23 U/L (ref 9–46)
AST: 24 U/L (ref 10–35)
Albumin: 4.1 g/dL (ref 3.6–5.1)
Alkaline phosphatase (APISO): 51 U/L (ref 35–144)
BUN: 13 mg/dL (ref 7–25)
CO2: 31 mmol/L (ref 20–32)
Calcium: 9.6 mg/dL (ref 8.6–10.3)
Chloride: 104 mmol/L (ref 98–110)
Creat: 1.14 mg/dL (ref 0.70–1.25)
GFR, Est African American: 77 mL/min/{1.73_m2} (ref >=60)
GFR, Est Non African American: 67 mL/min/{1.73_m2} (ref >=60)
Globulin: 2.6 g/dL (calc) (ref 1.9–3.7)
Glucose, Bld: 107 mg/dL — ABNORMAL HIGH (ref 65–99)
Potassium: 4.7 mmol/L (ref 3.5–5.3)
Sodium: 140 mmol/L (ref 135–146)
Total Bilirubin: 0.4 mg/dL (ref 0.2–1.2)
Total Protein: 6.7 g/dL (ref 6.1–8.1)

## 2019-01-31 LAB — LIPID PANEL
Cholesterol: 245 mg/dL — ABNORMAL HIGH (ref <200)
HDL: 46 mg/dL (ref >=40)
LDL Cholesterol (Calc): 166 mg/dL (calc) — ABNORMAL HIGH
Non-HDL Cholesterol (Calc): 199 mg/dL (calc) — ABNORMAL HIGH (ref <130)
Total CHOL/HDL Ratio: 5.3 (calc) — ABNORMAL HIGH (ref <5.0)
Triglycerides: 179 mg/dL — ABNORMAL HIGH (ref <150)

## 2019-01-31 LAB — HEMOGLOBIN A1C
Hgb A1c MFr Bld: 7.2 % of total Hgb — ABNORMAL HIGH (ref <5.7)
Mean Plasma Glucose: 160 (calc)
eAG (mmol/L): 8.9 (calc)

## 2019-01-31 LAB — VITAMIN D 25 HYDROXY (VIT D DEFICIENCY, FRACTURES): Vit D, 25-Hydroxy: 31 ng/mL (ref 30–100)

## 2019-01-31 NOTE — Telephone Encounter (Signed)
Prior Authorization initiated for Eligard. All documents faxed.

## 2019-01-31 NOTE — Telephone Encounter (Signed)
-----   Message from Daiva Huge, RN sent at 01/30/2019  9:31 AM EST ----- Regarding: Eligard Good morning,   Dr. Baruch Gouty would like Mr. Steidinger set up for Eligard with Dr. Diamantina Providence.   Thank you,   Wilhemena Durie

## 2019-02-02 ENCOUNTER — Other Ambulatory Visit: Payer: Self-pay | Admitting: *Deleted

## 2019-02-02 DIAGNOSIS — C61 Malignant neoplasm of prostate: Secondary | ICD-10-CM

## 2019-02-05 ENCOUNTER — Encounter: Payer: Self-pay | Admitting: Family Medicine

## 2019-02-06 ENCOUNTER — Ambulatory Visit
Admission: RE | Admit: 2019-02-06 | Discharge: 2019-02-06 | Disposition: A | Discharge status: Home / Self Care | Payer: PPO | Source: Ambulatory Visit | Attending: Radiation Oncology | Admitting: Radiation Oncology

## 2019-02-06 ENCOUNTER — Other Ambulatory Visit: Payer: Self-pay

## 2019-02-06 DIAGNOSIS — C61 Malignant neoplasm of prostate: Secondary | ICD-10-CM | POA: Insufficient documentation

## 2019-02-07 ENCOUNTER — Telehealth: Payer: Self-pay

## 2019-02-07 ENCOUNTER — Ambulatory Visit
Admission: RE | Admit: 2019-02-07 | Discharge: 2019-02-07 | Disposition: A | Discharge status: Home / Self Care | Payer: PPO | Source: Ambulatory Visit | Attending: Radiation Oncology | Admitting: Radiation Oncology

## 2019-02-07 ENCOUNTER — Other Ambulatory Visit: Payer: Self-pay

## 2019-02-07 DIAGNOSIS — C61 Malignant neoplasm of prostate: Secondary | ICD-10-CM | POA: Diagnosis not present

## 2019-02-07 NOTE — Telephone Encounter (Signed)
Incoming prior authorization for Lupron, approved. Effective dates 01/05/2019 - 01/04/2020.

## 2019-02-08 ENCOUNTER — Other Ambulatory Visit: Payer: Self-pay

## 2019-02-08 ENCOUNTER — Ambulatory Visit
Admission: RE | Admit: 2019-02-08 | Discharge: 2019-02-08 | Disposition: A | Discharge status: Home / Self Care | Payer: PPO | Source: Ambulatory Visit | Attending: Radiation Oncology | Admitting: Radiation Oncology

## 2019-02-08 DIAGNOSIS — C61 Malignant neoplasm of prostate: Secondary | ICD-10-CM | POA: Diagnosis not present

## 2019-02-08 NOTE — Telephone Encounter (Signed)
Pt informed via mychart that injection is approved advised pt to call office to schedule or request an appointment via mychart

## 2019-02-09 ENCOUNTER — Ambulatory Visit
Admission: RE | Admit: 2019-02-09 | Discharge: 2019-02-09 | Disposition: A | Discharge status: Home / Self Care | Payer: PPO | Source: Ambulatory Visit | Attending: Radiation Oncology | Admitting: Radiation Oncology

## 2019-02-09 ENCOUNTER — Other Ambulatory Visit: Payer: Self-pay

## 2019-02-09 DIAGNOSIS — C61 Malignant neoplasm of prostate: Secondary | ICD-10-CM | POA: Diagnosis not present

## 2019-02-12 ENCOUNTER — Other Ambulatory Visit: Payer: Self-pay

## 2019-02-12 ENCOUNTER — Ambulatory Visit
Admission: RE | Admit: 2019-02-12 | Discharge: 2019-02-12 | Disposition: A | Discharge status: Home / Self Care | Payer: PPO | Source: Ambulatory Visit | Attending: Radiation Oncology | Admitting: Radiation Oncology

## 2019-02-12 DIAGNOSIS — C61 Malignant neoplasm of prostate: Secondary | ICD-10-CM | POA: Diagnosis not present

## 2019-02-13 ENCOUNTER — Other Ambulatory Visit: Payer: Self-pay

## 2019-02-13 ENCOUNTER — Ambulatory Visit
Admission: RE | Admit: 2019-02-13 | Discharge: 2019-02-13 | Disposition: A | Discharge status: Home / Self Care | Payer: PPO | Source: Ambulatory Visit | Attending: Radiation Oncology | Admitting: Radiation Oncology

## 2019-02-13 DIAGNOSIS — C61 Malignant neoplasm of prostate: Secondary | ICD-10-CM | POA: Diagnosis not present

## 2019-02-14 ENCOUNTER — Ambulatory Visit
Admission: RE | Admit: 2019-02-14 | Discharge: 2019-02-14 | Disposition: A | Discharge status: Home / Self Care | Payer: PPO | Source: Ambulatory Visit | Attending: Radiation Oncology | Admitting: Radiation Oncology

## 2019-02-14 ENCOUNTER — Other Ambulatory Visit: Payer: Self-pay

## 2019-02-14 DIAGNOSIS — C61 Malignant neoplasm of prostate: Secondary | ICD-10-CM | POA: Diagnosis not present

## 2019-02-15 ENCOUNTER — Ambulatory Visit
Admission: RE | Admit: 2019-02-15 | Discharge: 2019-02-15 | Disposition: A | Discharge status: Home / Self Care | Payer: PPO | Source: Ambulatory Visit | Attending: Radiation Oncology | Admitting: Radiation Oncology

## 2019-02-15 ENCOUNTER — Other Ambulatory Visit: Payer: Self-pay

## 2019-02-15 DIAGNOSIS — C61 Malignant neoplasm of prostate: Secondary | ICD-10-CM | POA: Diagnosis not present

## 2019-02-16 ENCOUNTER — Ambulatory Visit
Admission: RE | Admit: 2019-02-16 | Discharge: 2019-02-16 | Disposition: A | Discharge status: Home / Self Care | Payer: PPO | Source: Ambulatory Visit | Attending: Radiation Oncology | Admitting: Radiation Oncology

## 2019-02-16 ENCOUNTER — Other Ambulatory Visit: Payer: Self-pay

## 2019-02-16 DIAGNOSIS — C61 Malignant neoplasm of prostate: Secondary | ICD-10-CM | POA: Diagnosis not present

## 2019-02-19 ENCOUNTER — Ambulatory Visit
Admission: RE | Admit: 2019-02-19 | Discharge: 2019-02-19 | Disposition: A | Discharge status: Home / Self Care | Payer: PPO | Source: Ambulatory Visit | Attending: Radiation Oncology | Admitting: Radiation Oncology

## 2019-02-19 ENCOUNTER — Other Ambulatory Visit: Payer: Self-pay

## 2019-02-19 DIAGNOSIS — C61 Malignant neoplasm of prostate: Secondary | ICD-10-CM | POA: Diagnosis not present

## 2019-02-20 ENCOUNTER — Other Ambulatory Visit: Payer: Self-pay

## 2019-02-20 ENCOUNTER — Ambulatory Visit
Admission: RE | Admit: 2019-02-20 | Discharge: 2019-02-20 | Disposition: A | Discharge status: Home / Self Care | Payer: PPO | Source: Ambulatory Visit | Attending: Radiation Oncology | Admitting: Radiation Oncology

## 2019-02-20 DIAGNOSIS — C61 Malignant neoplasm of prostate: Secondary | ICD-10-CM | POA: Diagnosis not present

## 2019-02-21 ENCOUNTER — Other Ambulatory Visit: Payer: Self-pay

## 2019-02-21 ENCOUNTER — Ambulatory Visit
Admission: RE | Admit: 2019-02-21 | Discharge: 2019-02-21 | Disposition: A | Discharge status: Home / Self Care | Payer: PPO | Source: Ambulatory Visit | Attending: Radiation Oncology | Admitting: Radiation Oncology

## 2019-02-21 ENCOUNTER — Inpatient Hospital Stay: Payer: PPO | Attending: Radiation Oncology

## 2019-02-21 DIAGNOSIS — C61 Malignant neoplasm of prostate: Secondary | ICD-10-CM | POA: Diagnosis not present

## 2019-02-21 LAB — CBC
HCT: 44.3 % (ref 39.0–52.0)
Hemoglobin: 14 g/dL (ref 13.0–17.0)
MCH: 26.3 pg (ref 26.0–34.0)
MCHC: 31.6 g/dL (ref 30.0–36.0)
MCV: 83.3 fL (ref 80.0–100.0)
Platelets: 274 10*3/uL (ref 150–400)
RBC: 5.32 MIL/uL (ref 4.22–5.81)
RDW: 13.4 % (ref 11.5–15.5)
WBC: 5.1 10*3/uL (ref 4.0–10.5)
nRBC: 0 % (ref 0.0–0.2)

## 2019-02-22 ENCOUNTER — Other Ambulatory Visit: Payer: Self-pay

## 2019-02-22 ENCOUNTER — Ambulatory Visit
Admission: RE | Admit: 2019-02-22 | Discharge: 2019-02-22 | Disposition: A | Discharge status: Home / Self Care | Payer: PPO | Source: Ambulatory Visit | Attending: Radiation Oncology | Admitting: Radiation Oncology

## 2019-02-22 DIAGNOSIS — C61 Malignant neoplasm of prostate: Secondary | ICD-10-CM | POA: Diagnosis not present

## 2019-02-23 ENCOUNTER — Other Ambulatory Visit: Payer: Self-pay

## 2019-02-23 ENCOUNTER — Ambulatory Visit
Admission: RE | Admit: 2019-02-23 | Discharge: 2019-02-23 | Disposition: A | Discharge status: Home / Self Care | Payer: PPO | Source: Ambulatory Visit | Attending: Radiation Oncology | Admitting: Radiation Oncology

## 2019-02-23 DIAGNOSIS — C61 Malignant neoplasm of prostate: Secondary | ICD-10-CM | POA: Diagnosis not present

## 2019-02-26 ENCOUNTER — Ambulatory Visit
Admission: RE | Admit: 2019-02-26 | Discharge: 2019-02-26 | Disposition: A | Discharge status: Home / Self Care | Payer: PPO | Source: Ambulatory Visit | Attending: Radiation Oncology | Admitting: Radiation Oncology

## 2019-02-26 ENCOUNTER — Other Ambulatory Visit: Payer: Self-pay

## 2019-02-26 DIAGNOSIS — C61 Malignant neoplasm of prostate: Secondary | ICD-10-CM | POA: Diagnosis not present

## 2019-02-27 ENCOUNTER — Ambulatory Visit
Admission: RE | Admit: 2019-02-27 | Discharge: 2019-02-27 | Disposition: A | Discharge status: Home / Self Care | Payer: PPO | Source: Ambulatory Visit | Attending: Radiation Oncology | Admitting: Radiation Oncology

## 2019-02-27 ENCOUNTER — Other Ambulatory Visit: Payer: Self-pay

## 2019-02-27 DIAGNOSIS — C61 Malignant neoplasm of prostate: Secondary | ICD-10-CM | POA: Diagnosis not present

## 2019-02-28 ENCOUNTER — Ambulatory Visit
Admission: RE | Admit: 2019-02-28 | Discharge: 2019-02-28 | Disposition: A | Discharge status: Home / Self Care | Payer: PPO | Source: Ambulatory Visit | Attending: Radiation Oncology | Admitting: Radiation Oncology

## 2019-02-28 ENCOUNTER — Other Ambulatory Visit: Payer: Self-pay

## 2019-02-28 DIAGNOSIS — C61 Malignant neoplasm of prostate: Secondary | ICD-10-CM | POA: Diagnosis not present

## 2019-03-01 ENCOUNTER — Other Ambulatory Visit: Payer: Self-pay

## 2019-03-01 ENCOUNTER — Ambulatory Visit
Admission: RE | Admit: 2019-03-01 | Discharge: 2019-03-01 | Disposition: A | Discharge status: Home / Self Care | Payer: PPO | Source: Ambulatory Visit | Attending: Radiation Oncology | Admitting: Radiation Oncology

## 2019-03-01 DIAGNOSIS — C61 Malignant neoplasm of prostate: Secondary | ICD-10-CM | POA: Diagnosis not present

## 2019-03-02 ENCOUNTER — Ambulatory Visit
Admission: RE | Admit: 2019-03-02 | Discharge: 2019-03-02 | Disposition: A | Discharge status: Home / Self Care | Payer: PPO | Source: Ambulatory Visit | Attending: Radiation Oncology | Admitting: Radiation Oncology

## 2019-03-02 ENCOUNTER — Other Ambulatory Visit: Payer: Self-pay

## 2019-03-02 DIAGNOSIS — C61 Malignant neoplasm of prostate: Secondary | ICD-10-CM | POA: Diagnosis not present

## 2019-03-05 ENCOUNTER — Ambulatory Visit
Admission: RE | Admit: 2019-03-05 | Discharge: 2019-03-05 | Disposition: A | Discharge status: Home / Self Care | Payer: PPO | Source: Ambulatory Visit | Attending: Radiation Oncology | Admitting: Radiation Oncology

## 2019-03-05 ENCOUNTER — Other Ambulatory Visit: Payer: Self-pay

## 2019-03-05 DIAGNOSIS — C61 Malignant neoplasm of prostate: Secondary | ICD-10-CM | POA: Insufficient documentation

## 2019-03-06 ENCOUNTER — Other Ambulatory Visit: Payer: Self-pay

## 2019-03-06 ENCOUNTER — Ambulatory Visit
Admission: RE | Admit: 2019-03-06 | Discharge: 2019-03-06 | Disposition: A | Discharge status: Home / Self Care | Payer: PPO | Source: Ambulatory Visit | Attending: Radiation Oncology | Admitting: Radiation Oncology

## 2019-03-06 DIAGNOSIS — C61 Malignant neoplasm of prostate: Secondary | ICD-10-CM | POA: Diagnosis not present

## 2019-03-07 ENCOUNTER — Other Ambulatory Visit: Payer: Self-pay

## 2019-03-07 ENCOUNTER — Ambulatory Visit
Admission: RE | Admit: 2019-03-07 | Discharge: 2019-03-07 | Disposition: A | Discharge status: Home / Self Care | Payer: PPO | Source: Ambulatory Visit | Attending: Radiation Oncology | Admitting: Radiation Oncology

## 2019-03-07 ENCOUNTER — Inpatient Hospital Stay: Payer: PPO | Attending: Radiation Oncology

## 2019-03-07 DIAGNOSIS — C61 Malignant neoplasm of prostate: Secondary | ICD-10-CM | POA: Diagnosis not present

## 2019-03-07 LAB — CBC
HCT: 43.9 % (ref 39.0–52.0)
Hemoglobin: 14 g/dL (ref 13.0–17.0)
MCH: 26.6 pg (ref 26.0–34.0)
MCHC: 31.9 g/dL (ref 30.0–36.0)
MCV: 83.3 fL (ref 80.0–100.0)
Platelets: 175 10*3/uL (ref 150–400)
RBC: 5.27 MIL/uL (ref 4.22–5.81)
RDW: 13.8 % (ref 11.5–15.5)
WBC: 4.5 10*3/uL (ref 4.0–10.5)
nRBC: 0 % (ref 0.0–0.2)

## 2019-03-08 ENCOUNTER — Other Ambulatory Visit: Payer: Self-pay

## 2019-03-08 ENCOUNTER — Ambulatory Visit
Admission: RE | Admit: 2019-03-08 | Discharge: 2019-03-08 | Disposition: A | Discharge status: Home / Self Care | Payer: PPO | Source: Ambulatory Visit | Attending: Radiation Oncology | Admitting: Radiation Oncology

## 2019-03-08 DIAGNOSIS — C61 Malignant neoplasm of prostate: Secondary | ICD-10-CM | POA: Diagnosis not present

## 2019-03-09 ENCOUNTER — Ambulatory Visit
Admission: RE | Admit: 2019-03-09 | Discharge: 2019-03-09 | Disposition: A | Discharge status: Home / Self Care | Payer: PPO | Source: Ambulatory Visit | Attending: Radiation Oncology | Admitting: Radiation Oncology

## 2019-03-09 ENCOUNTER — Other Ambulatory Visit: Payer: Self-pay

## 2019-03-09 DIAGNOSIS — C61 Malignant neoplasm of prostate: Secondary | ICD-10-CM | POA: Diagnosis not present

## 2019-03-12 ENCOUNTER — Other Ambulatory Visit: Payer: Self-pay

## 2019-03-12 ENCOUNTER — Ambulatory Visit
Admission: RE | Admit: 2019-03-12 | Discharge: 2019-03-12 | Disposition: A | Discharge status: Home / Self Care | Payer: PPO | Source: Ambulatory Visit | Attending: Radiation Oncology | Admitting: Radiation Oncology

## 2019-03-12 DIAGNOSIS — C61 Malignant neoplasm of prostate: Secondary | ICD-10-CM | POA: Diagnosis not present

## 2019-03-13 ENCOUNTER — Ambulatory Visit
Admission: RE | Admit: 2019-03-13 | Discharge: 2019-03-13 | Disposition: A | Discharge status: Home / Self Care | Payer: PPO | Source: Ambulatory Visit | Attending: Radiation Oncology | Admitting: Radiation Oncology

## 2019-03-13 ENCOUNTER — Other Ambulatory Visit: Payer: Self-pay

## 2019-03-13 DIAGNOSIS — C61 Malignant neoplasm of prostate: Secondary | ICD-10-CM | POA: Diagnosis not present

## 2019-03-14 ENCOUNTER — Ambulatory Visit
Admission: RE | Admit: 2019-03-14 | Discharge: 2019-03-14 | Disposition: A | Discharge status: Home / Self Care | Payer: PPO | Source: Ambulatory Visit | Attending: Radiation Oncology | Admitting: Radiation Oncology

## 2019-03-14 DIAGNOSIS — C61 Malignant neoplasm of prostate: Secondary | ICD-10-CM | POA: Diagnosis not present

## 2019-03-15 ENCOUNTER — Ambulatory Visit
Admission: RE | Admit: 2019-03-15 | Discharge: 2019-03-15 | Disposition: A | Discharge status: Home / Self Care | Payer: PPO | Source: Ambulatory Visit | Attending: Radiation Oncology | Admitting: Radiation Oncology

## 2019-03-15 DIAGNOSIS — C61 Malignant neoplasm of prostate: Secondary | ICD-10-CM | POA: Diagnosis not present

## 2019-03-16 ENCOUNTER — Ambulatory Visit
Admission: RE | Admit: 2019-03-16 | Discharge: 2019-03-16 | Disposition: A | Discharge status: Home / Self Care | Payer: PPO | Source: Ambulatory Visit | Attending: Radiation Oncology | Admitting: Radiation Oncology

## 2019-03-16 DIAGNOSIS — C61 Malignant neoplasm of prostate: Secondary | ICD-10-CM | POA: Diagnosis not present

## 2019-03-19 ENCOUNTER — Ambulatory Visit
Admission: RE | Admit: 2019-03-19 | Discharge: 2019-03-19 | Disposition: A | Discharge status: Home / Self Care | Payer: PPO | Source: Ambulatory Visit | Attending: Radiation Oncology | Admitting: Radiation Oncology

## 2019-03-19 DIAGNOSIS — C61 Malignant neoplasm of prostate: Secondary | ICD-10-CM | POA: Diagnosis not present

## 2019-03-20 ENCOUNTER — Ambulatory Visit
Admission: RE | Admit: 2019-03-20 | Discharge: 2019-03-20 | Disposition: A | Discharge status: Home / Self Care | Payer: PPO | Source: Ambulatory Visit | Attending: Radiation Oncology | Admitting: Radiation Oncology

## 2019-03-20 DIAGNOSIS — C61 Malignant neoplasm of prostate: Secondary | ICD-10-CM | POA: Diagnosis not present

## 2019-03-21 ENCOUNTER — Inpatient Hospital Stay: Payer: PPO

## 2019-03-21 ENCOUNTER — Ambulatory Visit
Admission: RE | Admit: 2019-03-21 | Discharge: 2019-03-21 | Disposition: A | Discharge status: Home / Self Care | Payer: PPO | Source: Ambulatory Visit | Attending: Radiation Oncology | Admitting: Radiation Oncology

## 2019-03-21 DIAGNOSIS — C61 Malignant neoplasm of prostate: Secondary | ICD-10-CM | POA: Diagnosis not present

## 2019-03-21 LAB — CBC
HCT: 43.1 % (ref 39.0–52.0)
Hemoglobin: 14.1 g/dL (ref 13.0–17.0)
MCH: 27.1 pg (ref 26.0–34.0)
MCHC: 32.7 g/dL (ref 30.0–36.0)
MCV: 82.7 fL (ref 80.0–100.0)
Platelets: 242 10*3/uL (ref 150–400)
RBC: 5.21 MIL/uL (ref 4.22–5.81)
RDW: 13.9 % (ref 11.5–15.5)
WBC: 4.6 10*3/uL (ref 4.0–10.5)
nRBC: 0 % (ref 0.0–0.2)

## 2019-03-22 ENCOUNTER — Ambulatory Visit
Admission: RE | Admit: 2019-03-22 | Discharge: 2019-03-22 | Disposition: A | Discharge status: Home / Self Care | Payer: PPO | Source: Ambulatory Visit | Attending: Radiation Oncology | Admitting: Radiation Oncology

## 2019-03-22 DIAGNOSIS — C61 Malignant neoplasm of prostate: Secondary | ICD-10-CM | POA: Diagnosis not present

## 2019-03-23 ENCOUNTER — Ambulatory Visit
Admission: RE | Admit: 2019-03-23 | Discharge: 2019-03-23 | Disposition: A | Discharge status: Home / Self Care | Payer: PPO | Source: Ambulatory Visit | Attending: Radiation Oncology | Admitting: Radiation Oncology

## 2019-03-23 DIAGNOSIS — C61 Malignant neoplasm of prostate: Secondary | ICD-10-CM | POA: Diagnosis not present

## 2019-03-26 ENCOUNTER — Ambulatory Visit
Admission: RE | Admit: 2019-03-26 | Discharge: 2019-03-26 | Disposition: A | Discharge status: Home / Self Care | Payer: PPO | Source: Ambulatory Visit | Attending: Radiation Oncology | Admitting: Radiation Oncology

## 2019-03-26 DIAGNOSIS — C61 Malignant neoplasm of prostate: Secondary | ICD-10-CM | POA: Diagnosis not present

## 2019-03-27 ENCOUNTER — Ambulatory Visit
Admission: RE | Admit: 2019-03-27 | Discharge: 2019-03-27 | Disposition: A | Discharge status: Home / Self Care | Payer: PPO | Source: Ambulatory Visit | Attending: Radiation Oncology | Admitting: Radiation Oncology

## 2019-03-27 DIAGNOSIS — C61 Malignant neoplasm of prostate: Secondary | ICD-10-CM | POA: Diagnosis not present

## 2019-03-28 ENCOUNTER — Ambulatory Visit
Admission: RE | Admit: 2019-03-28 | Discharge: 2019-03-28 | Disposition: A | Discharge status: Home / Self Care | Payer: PPO | Source: Ambulatory Visit | Attending: Radiation Oncology | Admitting: Radiation Oncology

## 2019-03-28 DIAGNOSIS — C61 Malignant neoplasm of prostate: Secondary | ICD-10-CM | POA: Diagnosis not present

## 2019-03-29 ENCOUNTER — Ambulatory Visit
Admission: RE | Admit: 2019-03-29 | Discharge: 2019-03-29 | Disposition: A | Discharge status: Home / Self Care | Payer: PPO | Source: Ambulatory Visit | Attending: Radiation Oncology | Admitting: Radiation Oncology

## 2019-03-29 DIAGNOSIS — C61 Malignant neoplasm of prostate: Secondary | ICD-10-CM | POA: Diagnosis not present

## 2019-03-30 ENCOUNTER — Ambulatory Visit
Admission: RE | Admit: 2019-03-30 | Discharge: 2019-03-30 | Disposition: A | Discharge status: Home / Self Care | Payer: PPO | Source: Ambulatory Visit | Attending: Radiation Oncology | Admitting: Radiation Oncology

## 2019-03-30 DIAGNOSIS — C61 Malignant neoplasm of prostate: Secondary | ICD-10-CM | POA: Diagnosis not present

## 2019-05-03 ENCOUNTER — Encounter: Payer: Self-pay | Admitting: Radiation Oncology

## 2019-05-03 ENCOUNTER — Other Ambulatory Visit: Payer: Self-pay

## 2019-05-04 ENCOUNTER — Ambulatory Visit
Admission: RE | Admit: 2019-05-04 | Discharge: 2019-05-04 | Disposition: A | Discharge status: Home / Self Care | Payer: PPO | Source: Ambulatory Visit | Attending: Radiation Oncology | Admitting: Radiation Oncology

## 2019-05-04 VITALS — BP 131/78 | HR 63 | Temp 96.5°F | Resp 16 | Wt 234.2 lb

## 2019-05-04 DIAGNOSIS — Z923 Personal history of irradiation: Secondary | ICD-10-CM | POA: Insufficient documentation

## 2019-05-04 DIAGNOSIS — C61 Malignant neoplasm of prostate: Secondary | ICD-10-CM | POA: Diagnosis not present

## 2019-05-04 NOTE — Progress Notes (Signed)
Radiation Oncology Follow up Note  Name: John Benson.   Date:   05/04/2019 MRN:  [id number] DOB: 1950/07/13    This 69 y.o. male presents to the clinic today for 1 month follow-up status post salvage radiation therapy and patient with Gleason 9 (4+5) adenocarcinoma prostate status post robotic assisted prostatectomy.  REFERRING PROVIDER: Hubbard Hartshorn, FNP  HPI: Patient is a 69 year old male.  Now at 1 month having completed salvage radiation therapy in a patient status post robotic prostatectomy for stage IIb (T1 cN0 M0) Gleason 8 (5+3) adenocarcinoma prostate presenting with a PSA of 20.  His final pathology showed Gleason 9 (4+5) with extraprostatic extension as well as lymphovascular perineural invasion.  He is seen today 1 month out is doing well specifically denies any increased lower urinary tract symptoms diarrhea or fatigue.  COMPLICATIONS OF TREATMENT: none  FOLLOW UP COMPLIANCE: keeps appointments   PHYSICAL EXAM:  BP 131/78   Pulse 63   Temp (!) 96.5 F (35.8 C) (Tympanic)   Resp 16   Wt 234 lb 3.2 oz (106.2 kg)   BMI 34.09 kg/m  Well-developed well-nourished patient in NAD. HEENT reveals PERLA, EOMI, discs not visualized.  Oral cavity is clear. No oral mucosal lesions are identified. Neck is clear without evidence of cervical or supraclavicular adenopathy. Lungs are clear to A&P. Cardiac examination is essentially unremarkable with regular rate and rhythm without murmur rub or thrill. Abdomen is benign with no organomegaly or masses noted. Motor sensory and DTR levels are equal and symmetric in the upper and lower extremities. Cranial nerves II through XII are grossly intact. Proprioception is intact. No peripheral adenopathy or edema is identified. No motor or sensory levels are noted. Crude visual fields are within normal range.  RADIOLOGY RESULTS: No current films to review  PLAN: Present time patient is doing well very low side effect profile from his recent  salvage radiation therapy.  I am pleased with his overall progress.  I have asked to see him back in 3 months with a PSA prior to that visit.  Patient knows to call with any concerns at any time.  I would like to take this opportunity to thank you for allowing me to participate in the care of your patient.Noreene Filbert, MD

## 2019-05-23 ENCOUNTER — Telehealth: Payer: Self-pay | Admitting: Family Medicine

## 2019-05-23 NOTE — Chronic Care Management (AMB) (Signed)
  Chronic Care Management   Outreach Note  05/23/2019 Name: John Benson. MRN: [id number] DOB: 1950/06/01  John Benson. is a 69 y.o. year old male who is a primary care patient of Hubbard Hartshorn, FNP. I reached out to John Benson. by phone today in response to a referral sent by Mr. DELLAS MASIELLO Jr.'s health plan.     An unsuccessful telephone outreach was attempted today. The patient was referred to the case management team for assistance with care management and care coordination.   Follow Up Plan: A HIPPA compliant phone message was left for the patient providing contact information and requesting a return call.  The care management team will reach out to the patient again over the next 7 days.  If patient returns call to provider office, please advise to call [facility] [place], [place] [address], [place], [place] [address] Direct Dial: [phone] [email or web address]: [email or web address]

## 2019-05-29 ENCOUNTER — Other Ambulatory Visit: Payer: Self-pay

## 2019-05-29 ENCOUNTER — Ambulatory Visit (INDEPENDENT_AMBULATORY_CARE_PROVIDER_SITE_OTHER): Payer: PPO

## 2019-05-29 VITALS — BP 120/76 | HR 70 | Temp 97.1°F | Resp 16 | Ht 70.0 in | Wt 234.2 lb

## 2019-05-29 DIAGNOSIS — Z0000 Encounter for general adult medical examination without abnormal findings: Secondary | ICD-10-CM | POA: Diagnosis not present

## 2019-05-29 NOTE — Patient Instructions (Signed)
John Benson , Thank you for taking time to come for your Medicare Wellness Visit. I appreciate your ongoing commitment to your health goals. Please review the following plan we discussed and let me know if I can assist you in the future.   Screening recommendations/referrals: Colonoscopy: done 12/25/13 Recommended yearly ophthalmology/optometry visit for glaucoma screening and checkup Recommended yearly dental visit for hygiene and checkup  Vaccinations: Influenza vaccine: done 10/08/18 Pneumococcal vaccine: done 04/30/13 Tdap vaccine: done 07/21/10 Shingles vaccine: Shingrix discussed. Please contact your pharmacy for coverage information.  Covid-19: done 01/11/19 & 02/08/19  Advanced directives: Advance directive discussed with you today. I have provided a copy for you to complete at home and have notarized. Once this is complete please bring a copy in to our office so we can scan it into your chart.  Conditions/risks identified: Recommend healthy eating and physical activity for desired weight loss  Next appointment: Please follow up in one year for your Medicare Annual Wellness visit.    Preventive Care 31 Years and Older, Male Preventive care refers to lifestyle choices and visits with your health care provider that can promote health and wellness. What does preventive care include?  A yearly physical exam. This is also called an annual well check.  Dental exams once or twice a year.  Routine eye exams. Ask your health care provider how often you should have your eyes checked.  Personal lifestyle choices, including:  Daily care of your teeth and gums.  Regular physical activity.  Eating a healthy diet.  Avoiding tobacco and drug use.  Limiting alcohol use.  Practicing safe sex.  Taking low doses of aspirin every day.  Taking vitamin and mineral supplements as recommended by your health care provider. What happens during an annual well check? The services and screenings  done by your health care provider during your annual well check will depend on your age, overall health, lifestyle risk factors, and family history of disease. Counseling  Your health care provider may ask you questions about your:  Alcohol use.  Tobacco use.  Drug use.  Emotional well-being.  Home and relationship well-being.  Sexual activity.  Eating habits.  History of falls.  Memory and ability to understand (cognition).  Work and work Statistician. Screening  You may have the following tests or measurements:  Height, weight, and BMI.  Blood pressure.  Lipid and cholesterol levels. These may be checked every 5 years, or more frequently if you are over 60 years old.  Skin check.  Lung cancer screening. You may have this screening every year starting at age 25 if you have a 30-pack-year history of smoking and currently smoke or have quit within the past 15 years.  Fecal occult blood test (FOBT) of the stool. You may have this test every year starting at age 19.  Flexible sigmoidoscopy or colonoscopy. You may have a sigmoidoscopy every 5 years or a colonoscopy every 10 years starting at age 49.  Prostate cancer screening. Recommendations will vary depending on your family history and other risks.  Hepatitis C blood test.  Hepatitis B blood test.  Sexually transmitted disease (STD) testing.  Diabetes screening. This is done by checking your blood sugar (glucose) after you have not eaten for a while (fasting). You may have this done every 1-3 years.  Abdominal aortic aneurysm (AAA) screening. You may need this if you are a current or former smoker.  Osteoporosis. You may be screened starting at age 26 if you are at high  risk. Talk with your health care provider about your test results, treatment options, and if necessary, the need for more tests. Vaccines  Your health care provider may recommend certain vaccines, such as:  Influenza vaccine. This is recommended  every year.  Tetanus, diphtheria, and acellular pertussis (Tdap, Td) vaccine. You may need a Td booster every 10 years.  Zoster vaccine. You may need this after age 47.  Pneumococcal 13-valent conjugate (PCV13) vaccine. One dose is recommended after age 7.  Pneumococcal polysaccharide (PPSV23) vaccine. One dose is recommended after age 54. Talk to your health care provider about which screenings and vaccines you need and how often you need them. This information is not intended to replace advice given to you by your health care provider. Make sure you discuss any questions you have with your health care provider. Document Released: 01/17/2015 Document Revised: 09/10/2015 Document Reviewed: 10/22/2014 Elsevier Interactive Patient Education  2017 [place] Prevention in the Home Falls can cause injuries. They can happen to people of all ages. There are many things you can do to make your home safe and to help prevent falls. What can I do on the outside of my home?  Regularly fix the edges of walkways and driveways and fix any cracks.  Remove anything that might make you trip as you walk through a door, such as a raised step or threshold.  Trim any bushes or trees on the path to your home.  Use bright outdoor lighting.  Clear any walking paths of anything that might make someone trip, such as rocks or tools.  Regularly check to see if handrails are loose or broken. Make sure that both sides of any steps have handrails.  Any raised decks and porches should have guardrails on the edges.  Have any leaves, snow, or ice cleared regularly.  Use sand or salt on walking paths during winter.  Clean up any spills in your garage right away. This includes oil or grease spills. What can I do in the bathroom?  Use night lights.  Install grab bars by the toilet and in the tub and shower. Do not use towel bars as grab bars.  Use non-skid mats or decals in the tub or shower.  If  you need to sit down in the shower, use a plastic, non-slip stool.  Keep the floor dry. Clean up any water that spills on the floor as soon as it happens.  Remove soap buildup in the tub or shower regularly.  Attach bath mats securely with double-sided non-slip rug tape.  Do not have throw rugs and other things on the floor that can make you trip. What can I do in the bedroom?  Use night lights.  Make sure that you have a light by your bed that is easy to reach.  Do not use any sheets or blankets that are too big for your bed. They should not hang down onto the floor.  Have a firm chair that has side arms. You can use this for support while you get dressed.  Do not have throw rugs and other things on the floor that can make you trip. What can I do in the kitchen?  Clean up any spills right away.  Avoid walking on wet floors.  Keep items that you use a lot in easy-to-reach places.  If you need to reach something above you, use a strong step stool that has a grab bar.  Keep electrical cords out of the way.  Do not use floor polish or wax that makes floors slippery. If you must use wax, use non-skid floor wax.  Do not have throw rugs and other things on the floor that can make you trip. What can I do with my stairs?  Do not leave any items on the stairs.  Make sure that there are handrails on both sides of the stairs and use them. Fix handrails that are broken or loose. Make sure that handrails are as long as the stairways.  Check any carpeting to make sure that it is firmly attached to the stairs. Fix any carpet that is loose or worn.  Avoid having throw rugs at the top or bottom of the stairs. If you do have throw rugs, attach them to the floor with carpet tape.  Make sure that you have a light switch at the top of the stairs and the bottom of the stairs. If you do not have them, ask someone to add them for you. What else can I do to help prevent falls?  Wear shoes  that:  Do not have high heels.  Have rubber bottoms.  Are comfortable and fit you well.  Are closed at the toe. Do not wear sandals.  If you use a stepladder:  Make sure that it is fully opened. Do not climb a closed stepladder.  Make sure that both sides of the stepladder are locked into place.  Ask someone to hold it for you, if possible.  Clearly mark and make sure that you can see:  Any grab bars or handrails.  First and last steps.  Where the edge of each step is.  Use tools that help you move around (mobility aids) if they are needed. These include:  Canes.  Walkers.  Scooters.  Crutches.  Turn on the lights when you go into a dark area. Replace any light bulbs as soon as they burn out.  Set up your furniture so you have a clear path. Avoid moving your furniture around.  If any of your floors are uneven, fix them.  If there are any pets around you, be aware of where they are.  Review your medicines with your doctor. Some medicines can make you feel dizzy. This can increase your chance of falling. Ask your doctor what other things that you can do to help prevent falls. This information is not intended to replace advice given to you by your health care provider. Make sure you discuss any questions you have with your health care provider. Document Released: 10/17/2008 Document Revised: 05/29/2015 Document Reviewed: 01/25/2014 Elsevier Interactive Patient Education  2017 [employer].

## 2019-05-29 NOTE — Chronic Care Management (AMB) (Signed)
  Chronic Care Management   Outreach Note  05/29/2019 Name: John Benson. MRN: [id number] DOB: [birth year]  John Benson. is a 69 y.o. year old male who is a primary care patient of Towanda Malkin, MD. I reached out to John Benson. by phone today in response to a referral sent by [name]'s health plan.     A second unsuccessful telephone outreach was attempted today. The patient was referred to the case management team for assistance with care management and care coordination.   Follow Up Plan: A HIPPA compliant phone message was left for the patient providing contact information and requesting a return call.  The care management team will reach out to the patient again over the next 7 days.  If patient returns call to provider office, please advise to call [facility] at [place], [place], [place], [place] [address] Direct Dial: [phone] [email or web address]: [email or web address]

## 2019-05-29 NOTE — Progress Notes (Signed)
Subjective:   John Benson. is a 69 y.o. male who presents for Medicare Annual/Subsequent preventive examination.  Review of Systems:   Cardiac Risk Factors include: advanced age (>53men, >36 women);male gender;diabetes mellitus;obesity (BMI >30kg/m2)     Objective:    Vitals: BP 120/76 (BP Location: Right Arm, Patient Position: Sitting, Cuff Size: Normal)   Pulse 70   Temp (!) 97.1 F (36.2 C) (Temporal)   Resp 16   Ht 5\' 10"  (1.778 m)   Wt 234 lb 3.2 oz (106.2 kg)   SpO2 99%   BMI 33.60 kg/m   Body mass index is 33.6 kg/m.  Advanced Directives 05/29/2019 05/03/2019 01/26/2019 08/18/2018 08/18/2018 08/09/2018 06/07/2018  Does Patient Have a Medical Advance Directive? No No No No No No No  Would patient like information on creating a medical advance directive? Yes (MAU/Ambulatory/Procedural Areas - Information given) No - Patient declined No - Patient declined No - Patient declined No - Patient declined No - Patient declined No - Patient declined    Tobacco Social History   Tobacco Use  Smoking Status Never Smoker  Smokeless Tobacco Never Used     Counseling given: Not Answered   Clinical Intake:  Pre-visit preparation completed: Yes  Pain : No/denies pain     BMI - recorded: 33.6 Nutritional Status: BMI > 30  Obese Nutritional Risks: None Diabetes: Yes CBG done?: No Did pt. bring in CBG monitor from home?: No   How often do you need to have someone help you when you read instructions, pamphlets, or other written materials from your doctor or pharmacy?: 1 - Never  Interpreter Needed?: No  Information entered by :: Clemetine Marker LPN  Past Medical History:  Diagnosis Date  . Benign essential HTN   . Calculus of kidney   . Cancer ([place])   . Carpal tunnel syndrome   . Carpal tunnel syndrome 07/15/2014  . Chronic kidney disease, stage II (mild)   . Diabetes mellitus without complication ([place])    controlled by diet and exercise  . History of kidney stones     . HTN (hypertension)    maintained by diet and exercise  . Hyperlipidemia   . Hypogonadism in male   . Obesity   . Prostate cancer [facility])    Past Surgical History:  Procedure Laterality Date  . COLONOSCOPY    . LITHOTRIPSY    . PELVIC LYMPH NODE DISSECTION Bilateral 08/18/2018   Procedure: PELVIC LYMPH NODE DISSECTION;  Surgeon: Billey Co, MD;  Location: [facility] ORS;  Service: Urology;  Laterality: Bilateral;  . ROBOT ASSISTED LAPAROSCOPIC RADICAL PROSTATECTOMY N/A 08/18/2018   Procedure: XI ROBOTIC ASSISTED LAPAROSCOPIC RADICAL PROSTATECTOMY;  Surgeon: Billey Co, MD;  Location: [facility] ORS;  Service: Urology;  Laterality: N/A;   Family History  Problem Relation Age of Onset  . Diabetes Mother   . Cancer Mother        breast  . Vision loss Mother   . Hypertension Father   . Dementia Father   . Diabetes Brother   . Early death Brother   . Pancreatic disease Brother   . Early death Sister   . Kidney disease Sister   . Kidney cancer Neg Hx   . Prostate cancer Neg Hx    Social History   Socioeconomic History  . Marital status: Married    Spouse name: Not on file  . Number of children: Not on file  . Years of education: Not on file  .  Highest education level: Not on file  Occupational History  . Not on file  Tobacco Use  . Smoking status: Never Smoker  . Smokeless tobacco: Never Used  Substance and Sexual Activity  . Alcohol use: No    Alcohol/week: 0.0 standard drinks  . Drug use: No  . Sexual activity: Yes  Other Topics Concern  . Not on file  Social History Narrative  . Not on file   Social Determinants of Health   Financial Resource Strain: Low Risk   . Difficulty of Paying Living Expenses: Not hard at all  Food Insecurity: No Food Insecurity  . Worried About Charity fundraiser in the Last Year: Never true  . Ran Out of Food in the Last Year: Never true  Transportation Needs: No Transportation Needs  . Lack of Transportation (Medical): No  . Lack  of Transportation (Non-Medical): No  Physical Activity: Sufficiently Active  . Days of Exercise per Week: 6 days  . Minutes of Exercise per Session: 60 min  Stress: No Stress Concern Present  . Feeling of Stress : Not at all  Social Connections: Slightly Isolated  . Frequency of Communication with Friends and Family: More than three times a week  . Frequency of Social Gatherings with Friends and Family: Three times a week  . Attends Religious Services: More than 4 times per year  . Active Member of Clubs or Organizations: No  . Attends Archivist Meetings: Never  . Marital Status: Married    Outpatient Encounter Medications as of 05/29/2019  Medication Sig  . BAYER ASPIRIN EC LOW DOSE 81 MG EC tablet   . COLACE 100 MG capsule   . Multiple Vitamin (MULTIVITAMIN) LIQD Take 10 mLs by mouth daily.  . Omega-3 Fatty Acids (OMEGA-3 FISH OIL PO) Take 1 capsule by mouth daily.   No facility-administered encounter medications on file as of 05/29/2019.    Activities of Daily Living In your present state of health, do you have any difficulty performing the following activities: 05/29/2019 01/30/2019  Hearing? N N  Comment declines hearing aids -  Vision? N N  Difficulty concentrating or making decisions? N N  Walking or climbing stairs? N N  Dressing or bathing? N N  Doing errands, shopping? N N  Preparing Food and eating ? N -  Using the Toilet? N -  In the past six months, have you accidently leaked urine? N -  Do you have problems with loss of bowel control? N -  Managing your Medications? N -  Managing your Finances? N -  Housekeeping or managing your Housekeeping? N -  Some recent data might be hidden    Patient Care Team: Towanda Malkin, MD as PCP - General (Internal Medicine) Noreene Filbert, MD as Radiation Oncologist (Radiation Oncology)   Assessment:   This is a routine wellness examination for John Benson.  Exercise Activities and Dietary  recommendations Current Exercise Habits: Home exercise routine, Type of exercise: treadmill, Time (Minutes): 60, Frequency (Times/Week): 6, Weekly Exercise (Minutes/Week): 360, Intensity: Moderate, Exercise limited by: None identified  Goals    . Weight (lb) < 205 lb (93 kg)     Pt states he would like to lose 10-15 lbs over the next year       Fall Risk Fall Risk  05/29/2019 01/30/2019 07/27/2018 07/27/2018 07/27/2018  Falls in the past year? 0 0 0 0 0  Number falls in past yr: 0 0 0 - 0  Injury with Fall?  0 0 0 - 0  Risk for fall due to : No Fall Risks - - - -  Follow up Falls prevention discussed - Falls evaluation completed - Falls evaluation completed   [place]:  Any stairs in or around the home? No  If so, do they handrails? No   Home free of loose throw rugs in walkways, pet beds, electrical cords, etc? Yes  Adequate lighting in your home to reduce risk of falls? Yes   ASSISTIVE DEVICES UTILIZED TO PREVENT FALLS:  Life alert? No  Use of a cane, walker or w/c? No  Grab bars in the bathroom? No  Shower chair or bench in shower? No  Elevated toilet seat or a handicapped toilet? No    TIMED UP AND GO:  Was the test performed? Yes.      Depression Screen PHQ 2/9 Scores 05/29/2019 01/30/2019 07/27/2018 05/25/2018  PHQ - 2 Score 0 0 0 0  PHQ- 9 Score - 0 0 0    Cognitive Function 6CIT deferred for 2021 AWV; pt has no memory issues     6CIT Screen 05/25/2018  What Year? 0 points  What month? 0 points  What time? 0 points  Count back from 20 0 points  Months in reverse 0 points  Repeat phrase 2 points  Total Score 2    Immunization History  Administered Date(s) Administered  . Influenza, High Dose Seasonal PF 10/08/2018  . Influenza, Seasonal, Injecte, Preservative Fre 10/12/2013  . Influenza,inj,Quad PF,6+ Mos 10/26/2012, 09/30/2015, 10/22/2016, 09/29/2017  . Influenza-Unspecified 09/25/2014, 09/29/2017  . Moderna  SARS-COVID-2 Vaccination 01/11/2019, 02/08/2019  . Pneumococcal Conjugate-13 04/30/2013  . Pneumococcal Polysaccharide-23 07/21/2010  . Tdap 07/21/2010    Qualifies for Shingles Vaccine? Yes  . Due for Shingrix. Education has been provided regarding the importance of this vaccine. Pt has been advised to call insurance company to determine out of pocket expense. Advised may also receive vaccine at local pharmacy or Health Dept. Verbalized acceptance and understanding.  Tdap: Up to date  Flu Vaccine: Up to date  Pneumococcal Vaccine: Up to date  Covid-19 Vaccine: Up to date   Screening Tests Health Maintenance  Topic Date Due  . PNA vac Low Risk Adult (2 of 2 - PPSV23) 12/25/2017  . URINE MICROALBUMIN  10/28/2018  . OPHTHALMOLOGY EXAM  01/28/2019  . FOOT EXAM  02/03/2019  . HEMOGLOBIN A1C  07/30/2019  . INFLUENZA VACCINE  08/05/2019  . TETANUS/TDAP  07/20/2020  . COLONOSCOPY  12/26/2023  . COVID-19 Vaccine  Completed  . Hepatitis C Screening  Completed   Cancer Screenings:  Colorectal Screening: Completed 12/25/13. Repeat every 10 years;   Lung Cancer Screening: (Low Dose CT Chest recommended if Age 42-80 years, 30 pack-year currently smoking OR have quit w/in 15years.) does not qualify.   Additional Screening:  Hepatitis C Screening: does qualify; Completed 06/22/11  Vision Screening: Recommended annual ophthalmology exams for early detection of glaucoma and other disorders of the eye. Is the patient up to date with their annual eye exam?  Yes  Who is the provider or what is the name of the office in which the pt attends annual eye exams? [place] Screening: Recommended annual dental exams for proper oral hygiene  Community Resource Referral:  CRR required this visit?  No       Plan:    I have personally reviewed and addressed the Medicare Annual Wellness questionnaire and have noted the following  in the patient's chart:  A. Medical and social  history B. Use of alcohol, tobacco or illicit drugs  C. Current medications and supplements D. Functional ability and status E.  Nutritional status F.  Physical activity G. Advance directives H. List of other physicians I.  Hospitalizations, surgeries, and ER visits in previous 12 months J.  [place] such as hearing and vision if needed, cognitive and depression L. Referrals and appointments   In addition, I have reviewed and discussed with patient certain preventive protocols, quality metrics, and best practice recommendations. A written personalized care plan for preventive services as well as general preventive health recommendations were provided to patient.   Signed,  Clemetine Marker, LPN Nurse Health Advisor   Nurse Notes: pt does not actively check blood sugar or on an diabetic meds. Advised to schedule follow up appt with Dr. Roxan Hockey for July for 6 month A1c check. Pt doing well and appreciative of visit today.

## 2019-06-01 ENCOUNTER — Encounter: Payer: Self-pay | Admitting: Urology

## 2019-06-05 NOTE — Chronic Care Management (AMB) (Signed)
  Chronic Care Management   Outreach Note  06/05/2019 Name: Dellas Masiello. MRN: [id number] DOB: [birth year]  [name]. is a 69 y.o. year old male who is a primary care patient of Towanda Malkin, MD. I reached out to [name]. by phone today in response to a referral sent by Mr. RACHAEL SCHICKLING Jr.'s health plan.     Third unsuccessful telephone outreach was attempted today. The patient was referred to the case management team for assistance with care management and care coordination. The patient's primary care provider has been notified of our unsuccessful attempts to make or maintain contact with the patient. The care management team is pleased to engage with this patient at any time in the future should he/she be interested in assistance from the care management team.   Follow Up Plan: The care management team is available to follow up with the patient after provider conversation with the patient regarding recommendation for care management engagement and subsequent re-referral to the care management team.   Noreene Larsson, [place], [place], [place] [address] Direct Dial: [phone] [email or web address]: [email or web address]

## 2019-07-04 NOTE — Progress Notes (Signed)
Patient ID: John Benson., male    DOB: 08-01-1950, 69 y.o.   MRN: [id number]  PCP: Towanda Malkin, MD  Chief Complaint  Patient presents with  . Follow-up  . Diabetes  . Hyperlipidemia    Subjective:   John Benson. is a 69 y.o. male, presents to clinic with CC of the following:  Chief Complaint  Patient presents with  . Follow-up  . Diabetes  . Hyperlipidemia    HPI:  Patient is a 69 year old male Last seen at [facility] by Delsa Grana in January 2021 Labs obtained from that visit were reviewed, with recommendations that were given including:  Cholesterol is very high, and higher than 6 months ago  10 year risk of stroke or heart attack is calculated to be > 20%  I strongly recommend starting a high intensity statin to decrease that risk.   A1C is higher as well, now higher than goal with A1C 7.2% This may improve with some diet changes, but 500 mg one to twice daily metformin would be helpful to get A1C down and it has other benefits as well.   Your other labs looked good, normal kidney/liver function, no anemia, vit D normal  Follows up today.  No medicines were started. He noted he has been able to manage his blood sugars and lipids with diet and exercise, and when I discussed potentially adding some medicines again pending his lab results, he stated he wanted to wait to see what the lab results were.  DM -medication regimen-none He stated he saw the eye doctor in May, and does see yearly. Diet - trying to avoid carbs, eating healthy, avoid sugars as best as he can Exercise - walking daily on treadmill, did take this week off so far. Lab Results  Component Value Date   HGBA1C 7.2 (H) 01/30/2019   HGBA1C 6.6 (H) 07/27/2018   HGBA1C 6.3 (H) 02/02/2018   Lab Results  Component Value Date   MICROALBUR 1.2 10/27/2017   LDLCALC 166 (H) 01/30/2019   CREATININE 1.14 01/30/2019        He denies polyuria (increases after drinks a lot of coffee  and water), polydipsia, pholyphagia, unintensional weight loss (states he has gained some weight), fatigue Obesity Wt Readings from Last 3 Encounters:  07/05/19 240 lb 6.4 oz (109 kg)  05/29/19 234 lb 3.2 oz (106.2 kg)  05/04/19 234 lb 3.2 oz (106.2 kg)  Weight has increased some in recent past.       HLD -   Medication regimen -omega-3 fatty acids, previously took lipitor 10 mg and 20 mg and lovaza, very briefly in his past Lab Results  Component Value Date   CHOL 245 (H) 01/30/2019   HDL 46 01/30/2019   LDLCALC 166 (H) 01/30/2019   LDLDIRECT 164 (H) 09/30/2015   TRIG 179 (H) 01/30/2019   CHOLHDL 5.3 (H) 01/30/2019    No allergy to statin documented.   Prostate cancer - pt  had radical prostatectomy (August 2020), then had radiation therapy with final treatment noted on 03/30/2019. On 05/04/2019 he presented to radiation oncology for his1 month follow-up status post salvage radiation therapy and patient with Gleason 9 (4+5) adenocarcinoma prostate status post robotic assisted prostatectomy. PLAN: Present time patient is doing well very low side effect profile from his recent salvage radiation therapy.  I am pleased with his overall progress.   I have asked to see him back in 3 months with a PSA prior  to that visit.  Patient knows to call with any concerns at any time. He has a follow-up with his urologist on 21 July, and also then with oncology after.  Colon cancer screening-he states he had a colonoscopy last at age 44, was okay by his report. Next due in December 2025  Patient Active Problem List   Diagnosis Date Noted  . Acute kidney failure ([place]) 08/20/2018  . Pain due to onychomycosis of toenails of both feet 06/15/2018  . Obesity (BMI 30.0-34.9) 07/27/2017  . Tinea pedis 04/27/2017  . Chronic kidney disease, stage II (mild) 04/27/2017  . Vitamin D deficiency 07/22/2016  . 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 HLD (hyperlipidemia) 07/15/2014  . Male hypogonadism  07/15/2014  . Spermatocele 07/15/2014  . Diabetes mellitus without complication ([place]) 69/67/8938  . Prostate cancer ([place]) 06/24/2014  . Calculus of kidney 10/08/2012  . Decreased libido 05/22/2008      Current Outpatient Medications:  .  BAYER ASPIRIN EC LOW DOSE 81 MG EC tablet, , Disp: , Rfl:  .  COLACE 100 MG capsule, , Disp: , Rfl:  .  Multiple Vitamin (MULTIVITAMIN) LIQD, Take 10 mLs by mouth daily., Disp: , Rfl:  .  Omega-3 Fatty Acids (OMEGA-3 FISH OIL PO), Take 1 capsule by mouth daily., Disp: , Rfl:    Allergies  Allergen Reactions  . Testosterone Itching and Rash     Past Surgical History:  Procedure Laterality Date  . COLONOSCOPY    . LITHOTRIPSY    . PELVIC LYMPH NODE DISSECTION Bilateral 08/18/2018   Procedure: PELVIC LYMPH NODE DISSECTION;  Surgeon: Billey Co, MD;  Location: [facility] ORS;  Service: Urology;  Laterality: Bilateral;  . ROBOT ASSISTED LAPAROSCOPIC RADICAL PROSTATECTOMY N/A 08/18/2018   Procedure: XI ROBOTIC ASSISTED LAPAROSCOPIC RADICAL PROSTATECTOMY;  Surgeon: Billey Co, MD;  Location: [facility] ORS;  Service: Urology;  Laterality: N/A;     Family History  Problem Relation Age of Onset  . Diabetes Mother   . Cancer Mother        breast  . Vision loss Mother   . Hypertension Father   . Dementia Father   . Diabetes Brother   . Early death Brother   . Pancreatic disease Brother   . Early death Sister   . Kidney disease Sister   . Kidney cancer Neg Hx   . Prostate cancer Neg Hx      Social History   Tobacco Use  . Smoking status: Never Smoker  . Smokeless tobacco: Never Used  Substance Use Topics  . Alcohol use: No    Alcohol/week: 0.0 standard drinks    With staff assistance, above reviewed with the patient today.  ROS: As per HPI, otherwise no specific complaints on a limited and focused system review   No results found for this or any previous visit (from the past 72 hour(s)).   PHQ2/9: Depression screen [facility] 2/9  07/05/2019 05/29/2019 01/30/2019 07/27/2018 05/25/2018  Decreased Interest 0 0 0 0 0  Down, Depressed, Hopeless 0 0 0 0 0  PHQ - 2 Score 0 0 0 0 0  Altered sleeping 0 - 0 0 0  Tired, decreased energy 0 - 0 0 0  Change in appetite 0 - 0 0 0  Feeling bad or failure about yourself  0 - 0 0 0  Trouble concentrating 0 - 0 0 0  Moving slowly or fidgety/restless 0 - 0 0 0  Suicidal thoughts 0 - 0 0 0  PHQ-9 Score  0 - 0 0 0  Difficult doing work/chores Not difficult at all - Not difficult at all Not difficult at all Not difficult at all  Some recent data might be hidden   PHQ-2/9 Result is neg  Fall Risk: Fall Risk  07/05/2019 05/29/2019 01/30/2019 07/27/2018 07/27/2018  Falls in the past year? 0 0 0 0 0  Number falls in past yr: 0 0 0 0 -  Injury with Fall? 0 0 0 0 -  Risk for fall due to : - No Fall Risks - - -  Follow up - Falls prevention discussed - Falls evaluation completed -      Objective:   Vitals:   07/05/19 0730  BP: 126/70  Pulse: 64  Resp: 16  Temp: 98.1 F (36.7 C)  TempSrc: Temporal  SpO2: 100%  Weight: 240 lb 6.4 oz (109 kg)  Height: 5' 9.5" (1.765 m)    Body mass index is 34.99 kg/m.  Physical Exam   NAD, masked, very pleasant HEENT - [place]/AT, sclera anicteric, PERRL, EOMI, conj - non-inj'ed, pharynx clear Neck - supple, no adenopathy, no TM, carotids 2+ and = without bruits bilat Car - RRR without m/g/r Pulm- RR and effort normal at rest, CTA without wheeze or rales Abd - soft, NT, mildly obese, ND, BS+,  no masses, no HSM Back - no CVA tenderness Skin- no rash noted on exposed areas, Ext - no LE edema, Neuro/psychiatric - affect was not flat, appropriate with conversation  Alert and oriented  Grossly non-focal - good strength on testing extremities, sensation intact to LT in distal extremities  Speech normal   Results for orders placed or performed in visit on 03/21/19  CBC  Result Value Ref Range   WBC 4.6 4.0 - 10.5 K/uL   RBC 5.21 4.22 - 5.81 MIL/uL    Hemoglobin 14.1 13.0 - 17.0 g/dL   HCT 43.1 39 - 52 %   MCV 82.7 80.0 - 100.0 fL   MCH 27.1 26.0 - 34.0 pg   MCHC 32.7 30.0 - 36.0 g/dL   RDW 13.9 11.5 - 15.5 %   Platelets 242 150 - 400 K/uL   nRBC 0.0 0.0 - 0.2 %       Assessment & Plan:   1. Other hyperlipidemia Last lipid panel quite abnormal, and in the setting of diabetes, spent a lot of time today discussing the benefits of adding a medicine like a statin, especially the cardioprotective benefits. His labs clearly indicate the need for a statin to help improve his lipid status, and also emphasized the benefits in addition. He wanted to wait until the labs returned before starting a statin, and I hope the time spent today was helpful in convincing him to start as anticipate the labs to be not significantly improved and still warranting a statin to be added. Also emphasized continuing regular exercise and diet modifications are important as well He was fasting this morning, and await the lab results ordered today. - Lipid panel - BASIC METABOLIC PANEL WITH GFR  2. Chronic kidney disease, stage II (mild) Discussed CKD with him today, and the importance of monitoring kidney function over time. Emphasized the importance of better management of diabetes to help preserve kidney function, and cardiac function over time as well. Check labs today. - BASIC METABOLIC PANEL WITH GFR  3. Type 2 diabetes mellitus with hyperglycemia, without long-term current use of insulin ([name]) As above, spent a lot of time today discussing the importance of better  blood sugar control if his numbers remain the same which I do anticipate. Discussed the Metformin product today, the risks and benefits of this medicine, potential side effect concerns, and often the XR form of this medicine is helpful in lessening side effects and will be recommended if his A1c remains above 7. Strongly encouraged him to start this medicine if that is the case, and hope that he  will do so. Await lab results today. Also emphasized continuing dietary modifications and staying very active, and information provided in the AVS to help as well. - BASIC METABOLIC PANEL WITH GFR - Hemoglobin A1c  4. Vitamin D deficiency Last vitamin D Lab Results  Component Value Date   VD25OH 31 01/30/2019  Last vitamin D check was good. Do not feel the need to repeat that today.  5. Prostate cancer [facility]) Has been doing well after finishing the radiation therapy in March and having his 1 month follow-up. Continue with follow-up as planned.  6. Obesity (BMI 30.0-34.9) Weight was increased some today, and discussed the importance of healthy weight maintenance over time with his conditions above. Dietary modifications and increased physical activity levels important in helping with healthy weight management.  Await labs, and hope that he was convinced of the importance of starting a statin as well as Metformin to help with management as noted above, Follow-up in approximately 3 months time, sooner as needed.      Towanda Malkin, MD 07/05/19 7:39 AM

## 2019-07-05 ENCOUNTER — Encounter: Payer: Self-pay | Admitting: Internal Medicine

## 2019-07-05 ENCOUNTER — Ambulatory Visit (INDEPENDENT_AMBULATORY_CARE_PROVIDER_SITE_OTHER): Payer: PPO | Admitting: Internal Medicine

## 2019-07-05 ENCOUNTER — Other Ambulatory Visit: Payer: Self-pay

## 2019-07-05 VITALS — BP 126/70 | HR 64 | Temp 98.1°F | Resp 16 | Ht 69.5 in | Wt 240.4 lb

## 2019-07-05 DIAGNOSIS — C61 Malignant neoplasm of prostate: Secondary | ICD-10-CM

## 2019-07-05 DIAGNOSIS — E7849 Other hyperlipidemia: Secondary | ICD-10-CM

## 2019-07-05 DIAGNOSIS — E559 Vitamin D deficiency, unspecified: Secondary | ICD-10-CM

## 2019-07-05 DIAGNOSIS — E669 Obesity, unspecified: Secondary | ICD-10-CM

## 2019-07-05 DIAGNOSIS — E1165 Type 2 diabetes mellitus with hyperglycemia: Secondary | ICD-10-CM

## 2019-07-05 DIAGNOSIS — N182 Chronic kidney disease, stage 2 (mild): Secondary | ICD-10-CM

## 2019-07-05 NOTE — Patient Instructions (Signed)
Type 2 Diabetes Mellitus, Self Care, Adult When you have type 2 diabetes (type 2 diabetes mellitus), you must make sure your blood sugar (glucose) stays in a healthy range. You can do this with:  Nutrition.  Exercise.  Lifestyle changes.  Medicines or insulin, if needed.  Support from your doctors and others. How to stay aware of blood sugar   Check your blood sugar level every day, as often as told.  Have your A1c (hemoglobin A1c) level checked two or more times a year. Have it checked more often if your doctor tells you to. Your doctor will set personal treatment goals for you. Generally, you should have these blood sugar levels:  Before meals (preprandial): 80-130 mg/dL (4.4-7.2 mmol/L).  After meals (postprandial): below 180 mg/dL (10 mmol/L).  A1c level: less than 7%. How to manage high and low blood sugar Signs of high blood sugar High blood sugar is called hyperglycemia. Know the signs of high blood sugar. Signs may include:  Feeling: ? Thirsty. ? Hungry. ? Very tired.  Needing to pee (urinate) more than usual.  Blurry vision. Signs of low blood sugar Low blood sugar is called hypoglycemia. This is when blood sugar is at or below 70 mg/dL (3.9 mmol/L). Signs may include:  Feeling: ? Hungry. ? Worried or nervous (anxious). ? Sweaty and clammy. ? Confused. ? Dizzy. ? Sleepy. ? Sick to your stomach (nauseous).  Having: ? A fast heartbeat. ? A headache. ? A change in your vision. ? Jerky movements that you cannot control (seizure). ? Tingling or no feeling (numbness) around your mouth, lips, or tongue.  Having trouble with: ? Moving (coordination). ? Sleeping. ? Passing out (fainting). ? Getting upset easily (irritability). Treating low blood sugar To treat low blood sugar, eat or drink something sugary right away. If you can think clearly and swallow safely, follow the 15:15 rule:  Take 15 grams of a fast-acting carb (carbohydrate). Talk with your  doctor about how much you should take.  Some fast-acting carbs are: ? Sugar tablets (glucose pills). Take 3-4 pills. ? 6-8 pieces of hard candy. ? 4-6 oz (120-150 mL) of fruit juice. ? 4-6 oz (120-150 mL) of regular (not diet) soda. ? 1 Tbsp (15 mL) honey or sugar.  Check your blood sugar 15 minutes after you take the carb.  If your blood sugar is still at or below 70 mg/dL (3.9 mmol/L), take 15 grams of a carb again.  If your blood sugar does not go above 70 mg/dL (3.9 mmol/L) after 3 tries, get help right away.  After your blood sugar goes back to normal, eat a meal or a snack within 1 hour. Treating very low blood sugar If your blood sugar is at or below 54 mg/dL (3 mmol/L), you have very low blood sugar (severe hypoglycemia). This is an emergency. Do not wait to see if the symptoms will go away. Get medical help right away. Call your local emergency services (911 in the [place] If you have very low blood sugar and you cannot eat or drink, you may need a glucagon shot (injection). A family member or friend should learn how to check your blood sugar and how to give you a glucagon shot. Ask your doctor if you need to have a glucagon shot kit at home. Follow these instructions at home: Medicine  Take insulin and diabetes medicines as told.  If your doctor says you should take more or less insulin and medicines, do this exactly as told.  Do not run out of insulin or medicines. Having diabetes can raise your risk for other long-term conditions. These include heart disease and kidney disease. Your doctor may prescribe medicines to help you not have these problems. Food   Make healthy food choices. These include: ? Chicken, fish, egg whites, and beans. ? Oats, whole wheat, bulgur, brown rice, quinoa, and millet. ? Fresh fruits and vegetables. ? Low-fat dairy products. ? Nuts, avocado, olive oil, and canola oil.  Meet with a food specialist (dietitian). He or she can help you make an  eating plan that is right for you.  Follow instructions from your doctor about what you cannot eat or drink.  Drink enough fluid to keep your pee (urine) pale yellow.  Keep track of carbs that you eat. Do this by reading food labels and learning food serving sizes.  Follow your sick day plan when you cannot eat or drink normally. Make this plan with your doctor so it is ready to use. Activity  Exercise 3 or more times a week.  Do not go more than 2 days without exercising.  Talk with your doctor before you start a new exercise. Your doctor may need to tell you to change: ? How much insulin or medicines you take. ? How much food you eat. Lifestyle  Do not use any tobacco products. These include cigarettes, chewing tobacco, and e-cigarettes. If you need help quitting, ask your doctor.  Ask your doctor how much alcohol is safe for you.  Learn to deal with stress. If you need help with this, ask your doctor. Body care   Stay up to date with your shots (immunizations).  Have your eyes and feet checked by a doctor as often as told.  Check your skin and feet every day. Check for cuts, bruises, redness, blisters, or sores.  Brush your teeth and gums two times a day. Floss one or more times a day.  Go to the dentist one or more times every 6 months.  Stay at a healthy weight. General instructions  Take over-the-counter and prescription medicines only as told by your doctor.  Share your diabetes care plan with: ? Your work or school. ? People you live with.  Carry a card or wear jewelry that says you have diabetes.  Keep all follow-up visits as told by your doctor. This is important. Questions to ask your doctor  Do I need to meet with a diabetes educator?  Where can I find a support group for people with diabetes? Where to find more information To learn more about diabetes, visit:  American Diabetes Association: www.diabetes.org  American Association of Diabetes  Educators: www.diabeteseducator.org Summary  When you have type 2 diabetes, you must make sure your blood sugar (glucose) stays in a healthy range.  Check your blood sugar every day, as often as told.  Having diabetes can raise your risk for other conditions. Your doctor may prescribe medicines to help you not have these problems.  Keep all follow-up visits as told by your doctor. This is important. This information is not intended to replace advice given to you by your health care provider. Make sure you discuss any questions you have with your health care provider. Document Revised: 06/13/2017 Document Reviewed: 01/24/2015 Elsevier Patient Education  2020 Elsevier Inc.   Diabetes Mellitus and Nutrition, Adult When you have diabetes (diabetes mellitus), it is very important to have healthy eating habits because your blood sugar (glucose) levels are greatly affected by what you   eat and drink. Eating healthy foods in the appropriate amounts, at about the same times every day, can help you:  Control your blood glucose.  Lower your risk of heart disease.  Improve your blood pressure.  Reach or maintain a healthy weight. Every person with diabetes is different, and each person has different needs for a meal plan. Your health care provider may recommend that you work with a diet and nutrition specialist (dietitian) to make a meal plan that is best for you. Your meal plan may vary depending on factors such as:  The calories you need.  The medicines you take.  Your weight.  Your blood glucose, blood pressure, and cholesterol levels.  Your activity level.  Other health conditions you have, such as heart or kidney disease. How do carbohydrates affect me? Carbohydrates, also called carbs, affect your blood glucose level more than any other type of food. Eating carbs naturally raises the amount of glucose in your blood. Carb counting is a method for keeping track of how many carbs you  eat. Counting carbs is important to keep your blood glucose at a healthy level, especially if you use insulin or take certain oral diabetes medicines. It is important to know how many carbs you can safely have in each meal. This is different for every person. Your dietitian can help you calculate how many carbs you should have at each meal and for each snack. Foods that contain carbs include:  Bread, cereal, rice, pasta, and crackers.  Potatoes and corn.  Peas, beans, and lentils.  Milk and yogurt.  Fruit and juice.  Desserts, such as cakes, cookies, ice cream, and candy. How does alcohol affect me? Alcohol can cause a sudden decrease in blood glucose (hypoglycemia), especially if you use insulin or take certain oral diabetes medicines. Hypoglycemia can be a life-threatening condition. Symptoms of hypoglycemia (sleepiness, dizziness, and confusion) are similar to symptoms of having too much alcohol. If your health care provider says that alcohol is safe for you, follow these guidelines:  Limit alcohol intake to no more than 1 drink per day for nonpregnant women and 2 drinks per day for men. One drink equals 12 oz of beer, 5 oz of wine, or 1 oz of hard liquor.  Do not drink on an empty stomach.  Keep yourself hydrated with water, diet soda, or unsweetened iced tea.  Keep in mind that regular soda, juice, and other mixers may contain a lot of sugar and must be counted as carbs. What are tips for following this plan?  Reading food labels  Start by checking the serving size on the "Nutrition Facts" label of packaged foods and drinks. The amount of calories, carbs, fats, and other nutrients listed on the label is based on one serving of the item. Many items contain more than one serving per package.  Check the total grams (g) of carbs in one serving. You can calculate the number of servings of carbs in one serving by dividing the total carbs by 15. For example, if a food has 30 g of total  carbs, it would be equal to 2 servings of carbs.  Check the number of grams (g) of saturated and trans fats in one serving. Choose foods that have low or no amount of these fats.  Check the number of milligrams (mg) of salt (sodium) in one serving. Most people should limit total sodium intake to less than 2,300 mg per day.  Always check the nutrition information of foods labeled   as "low-fat" or "nonfat". These foods may be higher in added sugar or refined carbs and should be avoided.  Talk to your dietitian to identify your daily goals for nutrients listed on the label. Shopping  Avoid buying canned, premade, or processed foods. These foods tend to be high in fat, sodium, and added sugar.  Shop around the outside edge of the grocery store. This includes fresh fruits and vegetables, bulk grains, fresh meats, and fresh dairy. Cooking  Use low-heat cooking methods, such as baking, instead of high-heat cooking methods like deep frying.  Cook using healthy oils, such as olive, canola, or sunflower oil.  Avoid cooking with butter, cream, or high-fat meats. Meal planning  Eat meals and snacks regularly, preferably at the same times every day. Avoid going long periods of time without eating.  Eat foods high in fiber, such as fresh fruits, vegetables, beans, and whole grains. Talk to your dietitian about how many servings of carbs you can eat at each meal.  Eat 4-6 ounces (oz) of lean protein each day, such as lean meat, chicken, fish, eggs, or tofu. One oz of lean protein is equal to: ? 1 oz of meat, chicken, or fish. ? 1 egg. ?  cup of tofu.  Eat some foods each day that contain healthy fats, such as avocado, nuts, seeds, and fish. Lifestyle  Check your blood glucose regularly.  Exercise regularly as told by your health care provider. This may include: ? 150 minutes of moderate-intensity or vigorous-intensity exercise each week. This could be brisk walking, biking, or water  aerobics. ? Stretching and doing strength exercises, such as yoga or weightlifting, at least 2 times a week.  Take medicines as told by your health care provider.  Do not use any products that contain nicotine or tobacco, such as cigarettes and e-cigarettes. If you need help quitting, ask your health care provider.  Work with a counselor or diabetes educator to identify strategies to manage stress and any emotional and social challenges. Questions to ask a health care provider  Do I need to meet with a diabetes educator?  Do I need to meet with a dietitian?  What number can I call if I have questions?  When are the best times to check my blood glucose? Where to find more information:  American Diabetes Association: diabetes.org  Academy of Nutrition and Dietetics: www.eatright.org  National Institute of Diabetes and Digestive and Kidney Diseases (NIH): www.niddk.nih.gov Summary  A healthy meal plan will help you control your blood glucose and maintain a healthy lifestyle.  Working with a diet and nutrition specialist (dietitian) can help you make a meal plan that is best for you.  Keep in mind that carbohydrates (carbs) and alcohol have immediate effects on your blood glucose levels. It is important to count carbs and to use alcohol carefully. This information is not intended to replace advice given to you by your health care provider. Make sure you discuss any questions you have with your health care provider. Document Revised: 12/03/2016 Document Reviewed: 01/26/2016 Elsevier Patient Education  2020 Elsevier Inc.   

## 2019-07-06 ENCOUNTER — Other Ambulatory Visit: Payer: Self-pay | Admitting: Internal Medicine

## 2019-07-06 DIAGNOSIS — E1165 Type 2 diabetes mellitus with hyperglycemia: Secondary | ICD-10-CM

## 2019-07-06 DIAGNOSIS — E782 Mixed hyperlipidemia: Secondary | ICD-10-CM

## 2019-07-06 LAB — HEMOGLOBIN A1C
Hgb A1c MFr Bld: 6.9 % of total Hgb — ABNORMAL HIGH (ref <5.7)
Mean Plasma Glucose: 151 (calc)
eAG (mmol/L): 8.4 (calc)

## 2019-07-06 LAB — BASIC METABOLIC PANEL WITH GFR
BUN/Creatinine Ratio: 10 (calc) (ref 6–22)
BUN: 12 mg/dL (ref 7–25)
CO2: 31 mmol/L (ref 20–32)
Calcium: 9.7 mg/dL (ref 8.6–10.3)
Chloride: 103 mmol/L (ref 98–110)
Creat: 1.26 mg/dL — ABNORMAL HIGH (ref 0.70–1.25)
GFR, Est African American: 68 mL/min/{1.73_m2} (ref >=60)
GFR, Est Non African American: 59 mL/min/{1.73_m2} — ABNORMAL LOW (ref >=60)
Glucose, Bld: 159 mg/dL — ABNORMAL HIGH (ref 65–99)
Potassium: 5.4 mmol/L — ABNORMAL HIGH (ref 3.5–5.3)
Sodium: 138 mmol/L (ref 135–146)

## 2019-07-06 LAB — LIPID PANEL
Cholesterol: 224 mg/dL — ABNORMAL HIGH (ref <200)
HDL: 54 mg/dL (ref >=40)
LDL Cholesterol (Calc): 149 mg/dL (calc) — ABNORMAL HIGH
Non-HDL Cholesterol (Calc): 170 mg/dL (calc) — ABNORMAL HIGH (ref <130)
Total CHOL/HDL Ratio: 4.1 (calc) (ref <5.0)
Triglycerides: 100 mg/dL (ref <150)

## 2019-07-06 MED ORDER — ATORVASTATIN CALCIUM 10 MG PO TABS: 10.0000 mg | ORAL_TABLET | Freq: Every day | ORAL | 3 refills | Status: DC

## 2019-07-06 MED ORDER — METFORMIN HCL ER 500 MG PO TB24: 500.0000 mg | ORAL_TABLET | Freq: Every day | ORAL | 1 refills | Status: DC

## 2019-07-06 NOTE — Progress Notes (Signed)
John Benson, The lab results from yesterday were reviewed. The lipid panel remains abnormal, with the cholesterol high at 224, and the lousy type of cholesterol (LDL cholesterol) high at 149.  The goal is the LDL cholesterol less than 70.  The lipid panel is only slightly improved from the one done previously, And as we discussed yesterday, I really feel the benefits of being on a statin product like Lipitor (atorvastatin) far outweigh any risks, and would strongly encourage you to start one.  I will prescribe the medicine to your pharmacy, at a low dose, and hope that you will start the medicine. The basic metabolic panel showed that your glucose was elevated at 159, and the hemoglobin A1c remains elevated at 6.9.  This is consistent with diabetes, and as we discussed yesterday, I would strongly encourage you to be on a product like Metformin to help with better blood sugar control.  I will also prescribe that medicine to your pharmacy, taking a small dose with your morning meal and I think that will be very beneficial. The basic metabolic panel showed that your kidney function was stable, with your creatinine just a little elevated at 1.26 (top normal is 1.25).  The potassium was slightly high at 5.4, and we should recheck this again at the latest on your next follow-up visit. As we talked about at your visit yesterday, I do feel adding these 2 medicines will be very helpful, but the benefits of these far outweigh any risk and I hope you will start them. Dr. Roxan Hockey

## 2019-07-06 NOTE — Progress Notes (Signed)
Result note sent to patient after labs reviewed.  John Benson, The lab results from yesterday were reviewed. The lipid panel remains abnormal, with the cholesterol high at 224, and the lousy type of cholesterol (LDL cholesterol) high at 149.  The goal is the LDL cholesterol less than 70.  The lipid panel is only slightly improved from the one done previously, And as we discussed yesterday, I really feel the benefits of being on a statin product like Lipitor (atorvastatin) far outweigh any risks, and would strongly encourage you to start one.  I will prescribe the medicine to your pharmacy, at a low dose, and hope that you will start the medicine. The basic metabolic panel showed that your glucose was elevated at 159, and the hemoglobin A1c remains elevated at 6.9.  This is consistent with diabetes, and as we discussed yesterday, I would strongly encourage you to be on a product like Metformin to help with better blood sugar control.  I will also prescribe that medicine to your pharmacy, taking a small dose with your morning meal and I think that will be very beneficial. The basic metabolic panel showed that your kidney function was stable, with your creatinine just a little elevated at 1.26 (top normal is 1.25).  The potassium was slightly high at 5.4, and we should recheck this again at the latest on your next follow-up visit. As we talked about at your visit yesterday, I do feel adding these 2 medicines will be very helpful, but the benefits of these far outweigh any risk and I hope you will start them. Dr. Roxan Hockey

## 2019-07-23 ENCOUNTER — Ambulatory Visit: Payer: Self-pay | Admitting: Urology

## 2019-07-25 ENCOUNTER — Encounter: Payer: Self-pay | Admitting: Urology

## 2019-07-25 ENCOUNTER — Other Ambulatory Visit: Payer: Self-pay

## 2019-07-25 ENCOUNTER — Ambulatory Visit: Payer: PPO | Admitting: Urology

## 2019-07-25 ENCOUNTER — Ambulatory Visit: Payer: Self-pay | Admitting: Urology

## 2019-07-25 VITALS — BP 156/74 | HR 71 | Ht 69.5 in | Wt 233.0 lb

## 2019-07-25 DIAGNOSIS — C61 Malignant neoplasm of prostate: Secondary | ICD-10-CM | POA: Diagnosis not present

## 2019-07-25 DIAGNOSIS — N529 Male erectile dysfunction, unspecified: Secondary | ICD-10-CM | POA: Diagnosis not present

## 2019-07-25 MED ORDER — TADALAFIL 20 MG PO TABS: 20.0000 mg | ORAL_TABLET | Freq: Every day | ORAL | 11 refills | Status: DC | PRN

## 2019-07-25 NOTE — Progress Notes (Signed)
   07/25/2019 1:35 PM   John Benson. 09-21-1950 [id number]  Reason for visit: Follow up high risk prostate cancer, ED  HPI: I saw John Benson in urology clinic for follow-up of the above issues.  He is a 69 year old African-American male who had a long history of significantly elevated PSA before finally falling up with urology and undergoing a prostate biopsy on 05/18/2018 for an elevated PSA of 20.  This showed high risk prostate cancer, and staging imaging showed no evidence of metastatic disease.He then underwent a robotic prostatectomy and bilateral lymphadenectomy on 08/18/2018 with final pathology showing no lymph node involvement, however Gleason score 4+5 = 9 pT3a prostate adenocarcinoma with extraprostatic extension, lymphovascular invasion, perineural invasion, and positive margins at the left apex, and bilaterally.  Carcinoma was present within the vascular bundles outside the muscular layer of the right SV.  His initial postop PSA in October 2020 was undetectable, however PSA in January 2021 was 0.2, and confirmed at 0.2 on repeat later that week.  He underwent salvage radiation with Dr. Baruch Gouty that was completed in March 2021.  He has not yet had a post radiation PSA.  He reports he is doing well today.  At baseline prior to surgery he had moderate to severe ED that was only minimally improved with PDE5 inhibitors.  He has not had erections since surgery.  I again reviewed that a nerve sparing procedure was not performed in the setting of his high risk disease.  He is interested in trial of PDE 5 inhibitors, and a prescription for 20 mg Cialis on demand was given.  We also reviewed other options for erectile dysfunction including vacuum erection device, penile injections, or penile implants.  He is having minimal incontinence even after radiation.  He does wear a pad for safety, but does not have significant problems with leakage.  He reports he urinates with a strong stream.  He  drinks a fair amount of water during the day, and he notices his urinary symptoms are increased when he drinks high volume of coffee in the morning.  PSA today, call with results Trial of Cialis for ED RTC 6 months with PSA prior  Billey Co, MD  [facility] [address] [place], [place] [address] [phone]

## 2019-07-25 NOTE — Patient Instructions (Signed)

## 2019-07-26 LAB — PSA: Prostate Specific Ag, Serum: <0.1 ng/mL (ref 0.0–4.0)

## 2019-07-27 ENCOUNTER — Telehealth: Payer: Self-pay

## 2019-07-27 NOTE — Telephone Encounter (Signed)
See my chart message

## 2019-07-27 NOTE — Telephone Encounter (Signed)
-----   Message from Billey Co, MD sent at 07/26/2019  6:30 PM EDT ----- Doristine Devoid news, PSA 0 again after radiation, rtc 6 months as scheduled  Nickolas Madrid, MD 07/26/2019

## 2019-07-30 ENCOUNTER — Other Ambulatory Visit: Payer: Self-pay | Admitting: Licensed Clinical Social Worker

## 2019-07-30 ENCOUNTER — Other Ambulatory Visit: Payer: Self-pay

## 2019-07-30 ENCOUNTER — Inpatient Hospital Stay: Payer: PPO | Attending: Radiation Oncology

## 2019-07-30 DIAGNOSIS — C61 Malignant neoplasm of prostate: Secondary | ICD-10-CM

## 2019-07-31 ENCOUNTER — Ambulatory Visit: Payer: PPO | Admitting: Family Medicine

## 2019-08-06 ENCOUNTER — Ambulatory Visit
Admission: RE | Admit: 2019-08-06 | Discharge: 2019-08-06 | Disposition: A | Discharge status: Home / Self Care | Payer: PPO | Source: Ambulatory Visit | Attending: Radiation Oncology | Admitting: Radiation Oncology

## 2019-08-06 ENCOUNTER — Encounter: Payer: Self-pay | Admitting: Radiation Oncology

## 2019-08-06 ENCOUNTER — Other Ambulatory Visit: Payer: Self-pay

## 2019-08-06 VITALS — BP 134/79 | HR 74 | Temp 96.0°F | Wt 237.0 lb

## 2019-08-06 DIAGNOSIS — Z923 Personal history of irradiation: Secondary | ICD-10-CM | POA: Diagnosis not present

## 2019-08-06 DIAGNOSIS — C61 Malignant neoplasm of prostate: Secondary | ICD-10-CM

## 2019-08-06 NOTE — Progress Notes (Signed)
Radiation Oncology Follow up Note  Name: John Benson.   Date:   08/06/2019 MRN:  [id number] DOB: Oct 31, 1950    This 69 y.o. male presents to the clinic today for 51-month follow-up status post salvage radiation therapy to his prostatic fossa for Gleason 9 (4+5) adenocarcinoma status post robotic assisted prostatectomy.  REFERRING PROVIDER: Hubbard Hartshorn, FNP  HPI: Patient is a 69 year old male now out 4 months having completed salvage radiation therapy to his prostatic fossa for biochemical failure after robot-assisted prostatectomy for Gleason 9 adenocarcinoma seen today in routine follow-up he is doing well.  He specifically denies any increased lower urinary tract symptoms diarrhea or fatigue.  His most recent PSA is less than 0.1.  COMPLICATIONS OF TREATMENT: none  FOLLOW UP COMPLIANCE: keeps appointments   PHYSICAL EXAM:  BP (!) 134/79 (BP Location: Right Arm, Patient Position: Sitting, Cuff Size: Large)   Pulse 74   Temp (!) 96 F (35.6 C) (Tympanic)   Wt (!) 237 lb (107.5 kg)   BMI 34.50 kg/m  Well-developed well-nourished patient in NAD. HEENT reveals PERLA, EOMI, discs not visualized.  Oral cavity is clear. No oral mucosal lesions are identified. Neck is clear without evidence of cervical or supraclavicular adenopathy. Lungs are clear to A&P. Cardiac examination is essentially unremarkable with regular rate and rhythm without murmur rub or thrill. Abdomen is benign with no organomegaly or masses noted. Motor sensory and DTR levels are equal and symmetric in the upper and lower extremities. Cranial nerves II through XII are grossly intact. Proprioception is intact. No peripheral adenopathy or edema is identified. No motor or sensory levels are noted. Crude visual fields are within normal range.  RADIOLOGY RESULTS: No current films to review  PLAN: Present time patient is under excellent biochemical control of his prostate cancer and pleased with his overall progress.  I have  asked to see him back in 6 months for follow-up with a PSA at that time.  Patient knows to call with any concerns at any time.  I would like to take this opportunity to thank you for allowing me to participate in the care of your patient.Noreene Filbert, MD

## 2019-10-04 NOTE — Progress Notes (Signed)
Patient ID: John Mandes., male    DOB: [birth year], 69 y.o.   MRN: [id number]  PCP: Towanda Malkin, MD  Chief Complaint  Patient presents with  . Follow-up    Subjective:   John Benson. is a 69 y.o. male, presents to clinic with CC of the following:  Chief Complaint  Patient presents with  . Follow-up    HPI:  Patient is a 69 year old male Last visit with me was 07/05/2019 Communication with lab results from that visit included the following:  The lab results from yesterday were reviewed. The lipid panel remains abnormal, with the cholesterol high at 224, and the lousy type of cholesterol (LDL cholesterol) high at 149.  The goal is the LDL cholesterol less than 70.  The lipid panel is only slightly improved from the one done previously, And as we discussed yesterday, I really feel the benefits of being on a statin product like Lipitor (atorvastatin) far outweigh any risks, and would strongly encourage you to start one.  I will prescribe the medicine to your pharmacy, at a low dose, and hope that you will start the medicine. The basic metabolic panel showed that your glucose was elevated at 159, and the hemoglobin A1c remains elevated at 6.9.  This is consistent with diabetes, and as we discussed yesterday, I would strongly encourage you to be on a product like Metformin to help with better blood sugar control.  I will also prescribe that medicine to your pharmacy, taking a small dose with your morning meal and I think that will be very beneficial. The basic metabolic panel showed that your kidney function was stable, with your creatinine just a little elevated at 1.26 (top normal is 1.25).  The potassium was slightly high at 5.4, and we should recheck this again at the latest on your next follow-up visit. As we talked about at your visit yesterday, I do feel adding these 2 medicines will be very helpful, but the benefits of these far outweigh any risk and I hope  you will start them  Follows up today All in all, he notes he is feeling well. He really made significant lifestyle changes, with diet and exercise and has lost weight, and did not start the 2 medicines that were recommended as he wanted to see the response with those changes.  DM -medication regimen-Glucophage XR-500 mg daily was recommended, but not yet started He stated he saw the eye doctor in May, and does see yearly. Diet - trying to avoid carbs, eating healthy, avoid sugars as best as he can, avoiding fried foods Exercise - walking daily on treadmill,  Lab Results  Component Value Date   HGBA1C 6.9 (H) 07/05/2019   HGBA1C 7.2 (H) 01/30/2019   HGBA1C 6.6 (H) 07/27/2018   Lab Results  Component Value Date   MICROALBUR 1.2 10/27/2017   LDLCALC 149 (H) 07/05/2019   CREATININE 1.26 (H) 07/05/2019   He denies polyuria (increases after drinks a lot of coffee and water), polydipsia, pholyphagia,  CKD -stage II  Lab Results  Component Value Date   CREATININE 1.26 (H) 07/05/2019   BUN 12 07/05/2019   NA 138 07/05/2019   K 5.4 (H) 07/05/2019   CL 103 07/05/2019   CO2 31 07/05/2019     Obesity Wt Readings from Last 3 Encounters:  10/05/19 218 lb 1.6 oz (98.9 kg)  08/06/19 (!) 237 lb (107.5 kg)  07/25/19 233 lb (105.7 kg)  Lost weight intentionally since AUgust   HLD-  Medication regimen -Lipitor-10 mg daily rec'ed although has not yet started, also not taking omega fatty acids Lab Results  Component Value Date   CHOL 224 (H) 07/05/2019   HDL 54 07/05/2019   LDLCALC 149 (H) 07/05/2019   LDLDIRECT 164 (H) 09/30/2015   TRIG 100 07/05/2019   CHOLHDL 4.1 07/05/2019   No allergy to statin documented.  Prostate cancer -  Last follow-up with radiation oncology 08/06/2019 with the following noted:  Patient is a 69 year old male now out 4 months having completed salvage radiation therapy to his prostatic fossa for biochemical failure after robot-assisted  prostatectomy for Gleason 9 adenocarcinoma seen today in routine follow-up he is doing well.  He specifically denies any increased lower urinary tract symptoms diarrhea or fatigue.  His most recent PSA is less than 0.1.  Present time patient is under excellent biochemical control of his prostate cancer and pleased with his overall progress.  I have asked to see him back in 6 months for follow-up with a PSA at that time.  Patient knows to call with any concerns at any time.  His last visit with urology was 07/25/2019 with the following noted:  He reports he is doing well today.  At baseline prior to surgery he had moderate to severe ED that was only minimally improved with PDE5 inhibitors.  He has not had erections since surgery.  I again reviewed that a nerve sparing procedure was not performed in the setting of his high risk disease.  He is interested in trial of PDE 5 inhibitors, and a prescription for 20 mg Cialis on demand was given.  We also reviewed other options for erectile dysfunction including vacuum erection device, penile injections, or penile implants.  He is having minimal incontinence even after radiation.  He does wear a pad for safety, but does not have significant problems with leakage.  He reports he urinates with a strong stream.  He drinks a fair amount of water during the day, and he notices his urinary symptoms are increased when he drinks high volume of coffee in the morning.  Colon cancer screening-he states he had a colonoscopy last at age 33, was okay by his report. Next due in December 2025  Had the flu shot   Patient Active Problem List   Diagnosis Date Noted  . Type 2 diabetes mellitus with hyperglycemia, without long-term current use of insulin ([place]) 07/05/2019  . Acute kidney failure ([facility]) 08/20/2018  . Pain due to onychomycosis of toenails of both feet 06/15/2018  . Obesity (BMI 30.0-34.9) 07/27/2017  . Tinea pedis 04/27/2017  . Chronic kidney disease, stage II  (mild) 04/27/2017  . Vitamin D deficiency 07/22/2016  . 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 HLD (hyperlipidemia) 07/15/2014  . Male hypogonadism 07/15/2014  . Spermatocele 07/15/2014  . Prostate cancer ([place]) 06/24/2014  . Calculus of kidney 10/08/2012  . Decreased libido 05/22/2008      Current Outpatient Medications:  .  atorvastatin (LIPITOR) 10 MG tablet, Take 1 tablet (10 mg total) by mouth daily., Disp: 90 tablet, Rfl: 3 .  BAYER ASPIRIN EC LOW DOSE 81 MG EC tablet, Take 81 mg by mouth daily. , Disp: , Rfl:  .  COLACE 100 MG capsule, Take 100 mg by mouth daily. , Disp: , Rfl:  .  metFORMIN (GLUCOPHAGE XR) 500 MG 24 hr tablet, Take 1 tablet (500 mg total) by mouth daily with breakfast., Disp: 90 tablet, Rfl: 1 .  Multiple Vitamin (MULTIVITAMIN) LIQD, Take 10 mLs by mouth daily., Disp: , Rfl:  .  Omega-3 Fatty Acids (OMEGA-3 FISH OIL PO), Take 1 capsule by mouth daily., Disp: , Rfl:  .  tadalafil (CIALIS) 20 MG tablet, Take 1 tablet (20 mg total) by mouth daily as needed for erectile dysfunction., Disp: 15 tablet, Rfl: 11   Allergies  Allergen Reactions  . Testosterone Itching and Rash     Past Surgical History:  Procedure Laterality Date  . COLONOSCOPY    . LITHOTRIPSY    . PELVIC LYMPH NODE DISSECTION Bilateral 08/18/2018   Procedure: PELVIC LYMPH NODE DISSECTION;  Surgeon: Billey Co, MD;  Location: [facility] ORS;  Service: Urology;  Laterality: Bilateral;  . ROBOT ASSISTED LAPAROSCOPIC RADICAL PROSTATECTOMY N/A 08/18/2018   Procedure: XI ROBOTIC ASSISTED LAPAROSCOPIC RADICAL PROSTATECTOMY;  Surgeon: Billey Co, MD;  Location: [facility] ORS;  Service: Urology;  Laterality: N/A;     Family History  Problem Relation Age of Onset  . Diabetes Mother   . Cancer Mother        breast  . Vision loss Mother   . Hypertension Father   . Dementia Father   . Diabetes Brother   . Early death Brother   . Pancreatic disease Brother   . Early death Sister   . Kidney disease  Sister   . Kidney cancer Neg Hx   . Prostate cancer Neg Hx      Social History   Tobacco Use  . Smoking status: Never Smoker  . Smokeless tobacco: Never Used  Substance Use Topics  . Alcohol use: No    Alcohol/week: 0.0 standard drinks    With staff assistance, above reviewed with the patient today.  ROS: As per HPI, otherwise no specific complaints on a limited and focused system review   No results found for this or any previous visit (from the past 72 hour(s)).   PHQ2/9: Depression screen [facility] 2/9 10/05/2019 07/05/2019 05/29/2019 01/30/2019 07/27/2018  Decreased Interest 0 0 0 0 0  Down, Depressed, Hopeless 0 0 0 0 0  PHQ - 2 Score 0 0 0 0 0  Altered sleeping - 0 - 0 0  Tired, decreased energy - 0 - 0 0  Change in appetite - 0 - 0 0  Feeling bad or failure about yourself  - 0 - 0 0  Trouble concentrating - 0 - 0 0  Moving slowly or fidgety/restless - 0 - 0 0  Suicidal thoughts - 0 - 0 0  PHQ-9 Score - 0 - 0 0  Difficult doing work/chores - Not difficult at all - Not difficult at all Not difficult at all  Some recent data might be hidden   PHQ-2/9 Result is neg Fall Risk: Fall Risk  10/05/2019 07/05/2019 05/29/2019 01/30/2019 07/27/2018  Falls in the past year? 0 0 0 0 0  Number falls in past yr: 0 0 0 0 0  Injury with Fall? 0 0 0 0 0  Risk for fall due to : - - No Fall Risks - -  Follow up Falls evaluation completed - Falls prevention discussed - Falls evaluation completed      Objective:   Vitals:   10/05/19 0733  BP: 126/82  Pulse: 66  Resp: 16  Temp: 97.7 F (36.5 C)  TempSrc: Oral  SpO2: 99%  Weight: 218 lb 1.6 oz (98.9 kg)  Height: 5' 9.5" (1.765 m)    Body mass index is 31.75 kg/m.  Physical Exam  NAD, masked, very pleasant HEENT - [place]/AT, sclera anicteric, PERRL, EOMI, conj - non-inj'ed, pharynx clear Neck - supple, no adenopathy, no TM, carotids 2+ and = without bruits bilat Car - RRR without m/g/r Pulm- RR and effort normal at rest, CTA without  wheeze or rales Abd - soft, NT diffusely, mildly obese,  Back - no CVA tenderness Skin- no rash noted on exposed areas, Ext - no LE edema, Diabetic foot exam: No skin breakdown, ulcers Normal DP pulses Normal sensation to light touch  Monofilament testing within normal limits Nails intact Neuro/psychiatric - affect was not flat, appropriate with conversation             Alert and oriented             Grossly non-focal - good strength on testing extremities, sensation intact to LT in distal extremities             Speech normal     Results for orders placed or performed in visit on 07/25/19  PSA  Result Value Ref Range   Prostate Specific Ag, Serum <0.1 0.0 - 4.0 ng/mL   Last labs reviewed    Assessment & Plan:    1. Type 2 diabetes mellitus with hyperglycemia, without long-term current use of insulin ([place]) I recommended starting Glucophage Exar after our last visit and lab results, although he decided to not start the medicine as she was making significant lifestyle changes. We will recheck the A1c today He did agree to start the medicine pending these results if felt indicated, and await the results presently. Congratulated on his efforts, and strongly encouraged him to continue with these changes including the dietary modifications as well as the increased exercise. - Hemoglobin A1c  2. Mixed hyperlipidemia Noted the atorvastatin is a medicine that would be good to start even with the lifestyle changes initiated, as he is diabetic, and the benefits from this medicine reviewed with him today.  Even if his lipid panel has improved, do feel the atorvastatin would be helpful to start, and recommended he start that today. He noted he did pick up that prescription and has that at home and will start. Did note it is a little early to recheck the lipids and assess the benefits, and can do on his follow-up.  3. Chronic kidney disease, stage II (mild) Continuing to monitor, and will  recheck today. - BASIC METABOLIC PANEL WITH GFR  4. Hyperkalemia Noted the potassium slightly high last check, and will recheck a BMP today. - BASIC METABOLIC PANEL WITH GFR  5. Obesity (BMI 30.0-34.9) Has intentionally lost weight and really made efforts with lifestyle modifications and as above, encouraged to continue.  6. Prostate cancer ([place]) Continue to follow-up with urology and oncology as he is doing.  7. Erectile dysfunction, unspecified erectile dysfunction type Continue follow-up with urology   Await lab results from today, and tentatively schedule a follow-up visit in 4 months time, follow-up sooner as needed. He did already have his flu shot in the recent past.  (Obtained at [facility])    Towanda Malkin, MD 10/05/19 7:44 AM

## 2019-10-05 ENCOUNTER — Encounter: Payer: Self-pay | Admitting: Internal Medicine

## 2019-10-05 ENCOUNTER — Other Ambulatory Visit: Payer: Self-pay

## 2019-10-05 ENCOUNTER — Ambulatory Visit (INDEPENDENT_AMBULATORY_CARE_PROVIDER_SITE_OTHER): Payer: PPO | Admitting: Internal Medicine

## 2019-10-05 VITALS — BP 126/82 | HR 66 | Temp 97.7°F | Resp 16 | Ht 69.5 in | Wt 218.1 lb

## 2019-10-05 DIAGNOSIS — E1165 Type 2 diabetes mellitus with hyperglycemia: Secondary | ICD-10-CM | POA: Diagnosis not present

## 2019-10-05 DIAGNOSIS — E875 Hyperkalemia: Secondary | ICD-10-CM | POA: Diagnosis not present

## 2019-10-05 DIAGNOSIS — E669 Obesity, unspecified: Secondary | ICD-10-CM | POA: Diagnosis not present

## 2019-10-05 DIAGNOSIS — E782 Mixed hyperlipidemia: Secondary | ICD-10-CM | POA: Diagnosis not present

## 2019-10-05 DIAGNOSIS — C61 Malignant neoplasm of prostate: Secondary | ICD-10-CM | POA: Diagnosis not present

## 2019-10-05 DIAGNOSIS — N182 Chronic kidney disease, stage 2 (mild): Secondary | ICD-10-CM | POA: Diagnosis not present

## 2019-10-05 DIAGNOSIS — N529 Male erectile dysfunction, unspecified: Secondary | ICD-10-CM

## 2019-10-06 LAB — BASIC METABOLIC PANEL WITH GFR
BUN/Creatinine Ratio: 12 (calc) (ref 6–22)
BUN: 16 mg/dL (ref 7–25)
CO2: 28 mmol/L (ref 20–32)
Calcium: 10.1 mg/dL (ref 8.6–10.3)
Chloride: 103 mmol/L (ref 98–110)
Creat: 1.29 mg/dL — ABNORMAL HIGH (ref 0.70–1.25)
GFR, Est African American: 67 mL/min/{1.73_m2} (ref >=60)
GFR, Est Non African American: 57 mL/min/{1.73_m2} — ABNORMAL LOW (ref >=60)
Glucose, Bld: 115 mg/dL — ABNORMAL HIGH (ref 65–99)
Potassium: 5.1 mmol/L (ref 3.5–5.3)
Sodium: 139 mmol/L (ref 135–146)

## 2019-10-06 LAB — HEMOGLOBIN A1C
Hgb A1c MFr Bld: 6.2 % of total Hgb — ABNORMAL HIGH (ref <5.7)
Mean Plasma Glucose: 131 (calc)
eAG (mmol/L): 7.3 (calc)

## 2020-01-22 ENCOUNTER — Other Ambulatory Visit: Payer: Self-pay | Admitting: Family Medicine

## 2020-01-22 DIAGNOSIS — C61 Malignant neoplasm of prostate: Secondary | ICD-10-CM

## 2020-01-23 ENCOUNTER — Other Ambulatory Visit: Payer: PPO

## 2020-01-23 ENCOUNTER — Other Ambulatory Visit: Payer: Self-pay

## 2020-01-23 DIAGNOSIS — C61 Malignant neoplasm of prostate: Secondary | ICD-10-CM

## 2020-01-24 LAB — PSA: Prostate Specific Ag, Serum: <0.1 ng/mL (ref 0.0–4.0)

## 2020-01-28 ENCOUNTER — Ambulatory Visit: Payer: Self-pay | Admitting: Urology

## 2020-01-30 ENCOUNTER — Other Ambulatory Visit: Payer: Self-pay

## 2020-01-30 ENCOUNTER — Ambulatory Visit
Admission: RE | Admit: 2020-01-30 | Discharge: 2020-01-30 | Disposition: A | Discharge status: Home / Self Care | Payer: PPO | Source: Ambulatory Visit | Attending: Radiation Oncology | Admitting: Radiation Oncology

## 2020-01-30 ENCOUNTER — Encounter: Payer: Self-pay | Admitting: Urology

## 2020-01-30 ENCOUNTER — Ambulatory Visit: Payer: PPO | Admitting: Urology

## 2020-01-30 VITALS — BP 140/76 | HR 62 | Temp 96.7°F | Wt 214.0 lb

## 2020-01-30 VITALS — BP 149/78 | HR 70 | Ht 69.0 in | Wt 210.0 lb

## 2020-01-30 DIAGNOSIS — Z923 Personal history of irradiation: Secondary | ICD-10-CM | POA: Insufficient documentation

## 2020-01-30 DIAGNOSIS — N529 Male erectile dysfunction, unspecified: Secondary | ICD-10-CM | POA: Insufficient documentation

## 2020-01-30 DIAGNOSIS — C61 Malignant neoplasm of prostate: Secondary | ICD-10-CM | POA: Diagnosis not present

## 2020-01-30 DIAGNOSIS — Z08 Encounter for follow-up examination after completed treatment for malignant neoplasm: Secondary | ICD-10-CM | POA: Diagnosis not present

## 2020-01-30 MED ORDER — NONFORMULARY OR COMPOUNDED ITEM: 0 refills | Status: DC

## 2020-01-30 NOTE — Progress Notes (Signed)
Radiation Oncology Follow up Note  Name: John Benson.   Date:   01/30/2020 MRN:  [id number] DOB: October 21, 1950    This 70 y.o. male presents to the clinic today for 51-month follow-up status post salvage radiation therapy to his prostatic fossa for Gleason 9 (4+5) adenocarcinoma the prostate status post robotic prostatectomy with biochemical failure.  REFERRING PROVIDER: Towanda Malkin*  HPI: Patient is a 70 year old male now out 10 months having completed salvage radiation therapy status post robotic prostatectomy for Gleason 9 adenocarcinoma with biochemical failure.  Seen today in routine follow-up he is doing well specifically denies any increased lower urinary tract symptoms.Diarrhea or fatigue his most recent PSA is less than 0.1.  He is being followed by urology for erectile dysfunction.  COMPLICATIONS OF TREATMENT: none  FOLLOW UP COMPLIANCE: keeps appointments   PHYSICAL EXAM:  BP 140/76   Pulse 62   Temp (!) 96.7 F (35.9 C) (Tympanic)   Wt 214 lb (97.1 kg)   BMI 31.60 kg/m  Well-developed well-nourished patient in NAD. HEENT reveals PERLA, EOMI, discs not visualized.  Oral cavity is clear. No oral mucosal lesions are identified. Neck is clear without evidence of cervical or supraclavicular adenopathy. Lungs are clear to A&P. Cardiac examination is essentially unremarkable with regular rate and rhythm without murmur rub or thrill. Abdomen is benign with no organomegaly or masses noted. Motor sensory and DTR levels are equal and symmetric in the upper and lower extremities. Cranial nerves II through XII are grossly intact. Proprioception is intact. No peripheral adenopathy or edema is identified. No motor or sensory levels are noted. Crude visual fields are within normal range.  RADIOLOGY RESULTS: No current films for review  PLAN: Present time patient is under excellent biochemical control status post salvage radiation therapy.  I am pleased with his overall  progress.  Of asked to see him back in 6 months for follow-up with a PSA at that time.  Patient knows to call with any concerns.  I would like to take this opportunity to thank you for allowing me to participate in the care of your patient.Noreene Filbert, MD

## 2020-01-30 NOTE — Progress Notes (Signed)
   01/30/2020 8:27 AM   [name]. 05-12-1950 [id number]  Reason for visit: Follow up high risk prostate cancer, ED  HPI: He is a 70 year old African-American male who had a long history of significantly elevated PSA before finally falling up with urology and undergoing a prostate biopsy on 05/18/2018 for an elevated PSA of 20.  This showed high risk prostate cancer, and staging imaging showed no evidence of metastatic disease.He then underwent a robotic prostatectomy and bilateral lymphadenectomy on 08/18/2018 with final pathology showing no lymph node involvement, however Gleason score 4+5=9 pT3a prostate adenocarcinoma with extraprostatic extension,lymphovascular invasion, perineural invasion, and positive margins at the left apex, and bilaterally. Carcinoma was present within the vascular bundles outside the muscular layer of the right SV.  His initial post-op PSA in October 2020 was undetectable, however PSA in January 2021 was 0.2, and confirmed at 0.2 on repeat later that week.  He underwent salvage radiation with Dr. Baruch Gouty that was completed in March 2021.    Post radiation PSA was undetectable.  He reports he is doing well today.  At baseline prior to surgery he had moderate to severe ED that was only minimally improved with PDE5 inhibitors.  He has not had erections since surgery, even with PDE 5 inhibitors.  I again reviewed that a nerve sparing procedure was not performed in the setting of his high risk disease.    He is also interested in trial of penile injections today, and a test dose prescription was provided, and he will follow-up with our PA for teaching.   He is doing well from a urination perspective.  He has minimal leakage, and wears a pad overnight just for safety.  He has some urgency if he drinks a lot of fluids.  He continues to urinate with a strong stream.  Denies significant stress incontinence.  PSA today remains undetectable.  We discussed the risk of  recurrence with his high risk disease, and the need for close PSA monitoring moving forward.  Would recommend continuing every 6 months for at least 2 years after radiation.  RTC with [facility] for intracavernosal injection teaching, Trimix test dose prescription provided RTC with me 6 months with PSA prior   Billey Co, MD  [place] [address] [place], [place] [address] [phone]

## 2020-02-05 ENCOUNTER — Ambulatory Visit: Payer: PPO | Admitting: Internal Medicine

## 2020-02-12 NOTE — Progress Notes (Signed)
John Benson presents today for a Titrate titration.  He is no longer having spontaneous erections.   He has had no response to PDE5i's.   He denies any history of sickle cell anemia or trait, a history of multiple myeloma or a history of leukemia.    He has not taken trazodone or a PDE5i's today.    Patient's left corpus cavernosum is identified.  An area near the base of the penis is cleansed with rubbing alcohol.  Careful to avoid the dorsal vein, 2 mcg of Trimix (papaverine 30 mg, phentolamine 1 mg and prostaglandin E1 10 mcg, Lot # 01/26/2020$RemoveBeforeD'@9'[id number]$  exp # 03/03/2020) is injected at a 90 degree angle into the left corpus cavernosum near the base of the penis.  Patient experienced a fullness in 15 minutes.    Patient's right corpus cavernosum is identified.  An area near the base of the penis is cleansed with rubbing alcohol.  Careful to avoid the dorsal vein, 2 mcg of Trimix (papaverine 30 mg, phentolamine 1 mg and prostaglandin E1 10 mcg, Lot # 01/26/2020$RemoveBeforeD'@9'[id number]$  exp # 03/03/2020) is injected at a 90 degree angle into the left corpus cavernosum near the base of the penis.  Patient experienced a fullness in 15 minutes.    Patient's right corpus cavernosum is identified.  An area near the base of the penis is cleansed with rubbing alcohol.  Careful to avoid the dorsal vein, 2 mcg of Trimix (papaverine 30 mg, phentolamine 1 mg and prostaglandin E1 10 mcg, Lot # 01/26/2020$RemoveBeforeD'@9'[id number]$  exp # 03/03/2020) is injected at a 90 degree angle into the left corpus cavernosum near the base of the penis.  Patient experienced a fullness in 15 minutes.    We decided at that time to discontinue the titration as he is experiencing a curve and may have Peyronie's disease.  He will return next week, so that I may examine him in a flaccid state and start the titration at 4 mcg.   Advised patient of the condition of priapism, painful erection lasting for more than four hours, and to contact the office immediately or seek treatment in the ED

## 2020-02-13 ENCOUNTER — Other Ambulatory Visit: Payer: Self-pay

## 2020-02-13 ENCOUNTER — Encounter: Payer: Self-pay | Admitting: Urology

## 2020-02-13 ENCOUNTER — Ambulatory Visit: Payer: PPO | Admitting: Urology

## 2020-02-13 VITALS — BP 138/79 | HR 65 | Ht 69.0 in | Wt 210.0 lb

## 2020-02-13 DIAGNOSIS — N529 Male erectile dysfunction, unspecified: Secondary | ICD-10-CM | POA: Diagnosis not present

## 2020-02-19 ENCOUNTER — Other Ambulatory Visit: Payer: Self-pay

## 2020-02-19 ENCOUNTER — Encounter: Payer: Self-pay | Admitting: Family Medicine

## 2020-02-19 ENCOUNTER — Ambulatory Visit (INDEPENDENT_AMBULATORY_CARE_PROVIDER_SITE_OTHER): Payer: PPO | Admitting: Family Medicine

## 2020-02-19 VITALS — BP 140/76 | HR 65 | Temp 97.9°F | Resp 16 | Ht 69.0 in | Wt 210.3 lb

## 2020-02-19 DIAGNOSIS — E7800 Pure hypercholesterolemia, unspecified: Secondary | ICD-10-CM

## 2020-02-19 DIAGNOSIS — E1165 Type 2 diabetes mellitus with hyperglycemia: Secondary | ICD-10-CM | POA: Diagnosis not present

## 2020-02-19 DIAGNOSIS — N529 Male erectile dysfunction, unspecified: Secondary | ICD-10-CM | POA: Insufficient documentation

## 2020-02-19 DIAGNOSIS — E669 Obesity, unspecified: Secondary | ICD-10-CM

## 2020-02-19 DIAGNOSIS — N182 Chronic kidney disease, stage 2 (mild): Secondary | ICD-10-CM | POA: Diagnosis not present

## 2020-02-19 DIAGNOSIS — N5235 Erectile dysfunction following radiation therapy: Secondary | ICD-10-CM

## 2020-02-19 DIAGNOSIS — Z23 Encounter for immunization: Secondary | ICD-10-CM

## 2020-02-19 LAB — POCT GLYCOSYLATED HEMOGLOBIN (HGB A1C): Hemoglobin A1C: 5.8 % — AB (ref 4.0–5.6)

## 2020-02-19 NOTE — Assessment & Plan Note (Signed)
Contributing to DM2, HLD. Recommend continued efforts with diet and exercise.

## 2020-02-19 NOTE — Assessment & Plan Note (Signed)
Doing well with current regimen. Medication list updated.

## 2020-02-19 NOTE — Assessment & Plan Note (Signed)
A1c improved to 5.8 today with diet and exercise changes. Can continue to withhold metformin for now. Congratulated on efforts. Will recheck in 3 months, if worsened, will revisit need for metformin. Recommended statin, patient still hesitant. Will check LDL today. Foot exam completed, no abnormalities noted.

## 2020-02-19 NOTE — Progress Notes (Signed)
   SUBJECTIVE:   CHIEF COMPLAINT / HPI:   Patient Active Problem List   Diagnosis Date Noted  . Erectile dysfunction 02/19/2020  . Type 2 diabetes mellitus with hyperglycemia, without long-term current use of insulin ([place]) 07/05/2019  . Acute kidney failure ([place]) 08/20/2018  . Pain due to onychomycosis of toenails of both feet 06/15/2018  . Obesity (BMI 30.0-34.9) 07/27/2017  . Tinea pedis 04/27/2017  . Chronic kidney disease, stage II (mild) 04/27/2017  . Vitamin D deficiency 07/22/2016  . 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 HLD (hyperlipidemia) 07/15/2014  . Male hypogonadism 07/15/2014  . Spermatocele 07/15/2014  . Prostate cancer ([place]) 06/24/2014  . Calculus of kidney 10/08/2012  . Decreased libido 05/22/2008   Diabetes, Type 2 - Last A1c 6.2 10/2019 - Medications: metformin 500mg  daily - Compliance: not taking, has been working on diet changes. - Checking BG at home: no - Diet: cutting back on fried foods and sugar. More veggies and meats.  - Exercise: walks x1 hour every morning.  - Eye exam: UTD - Foot exam: due - Microalbumin: due - Statin: no - Pneumococcal vaccine: due - Denies symptoms of hypoglycemia, polyuria, polydipsia, numbness extremities, foot ulcers/trauma  ED - follows with urology. S/p salvage radiation therapy for Gleason 9 adenocarcinoma. Last PSA <0.1 01/2020. Trialed Cialis 20mg  at last visit, not taking. Taking trimix injections instead with good effect. Urinating ok, strong stream. No hematuria. Has f/u with urology in July.   CHRONIC KIDNEY DISEASE CKD status: stable Medications renally dose: yes Pneumovax:  Not up to Date Influenza Vaccine:  Up to Date   OBJECTIVE:   BP 140/76   Pulse 65   Temp 97.9 F (36.6 C) (Oral)   Resp 16   Ht 5\' 9"  (1.753 m)   Wt 210 lb 4.8 oz (95.4 kg)   SpO2 99%   BMI 31.06 kg/m   Gen: well appearing, in NAD Card: RRR, no murmur Lungs: CTAB Ext: WWP, no edema. Foot exam wnl.   ASSESSMENT/PLAN:    Type 2 diabetes mellitus with hyperglycemia, without long-term current use of insulin ([name]) A1c improved to 5.8 today with diet and exercise changes. Can continue to withhold metformin for now. Congratulated on efforts. Will recheck in 3 months, if worsened, will revisit need for metformin. Recommended statin, patient still hesitant. Will check LDL today. Foot exam completed, no abnormalities noted.  Chronic kidney disease, stage II (mild) Recheck labs today.  HLD (hyperlipidemia) Again recommended statin given diabetes, patient refuses for now. Rechecking LDL today.  Obesity (BMI 30.0-34.9) Contributing to DM2, HLD. Recommend continued efforts with diet and exercise.  Erectile dysfunction Doing well with current regimen. Medication list updated.   F/u in 3 months.  Myles Gip, DO

## 2020-02-19 NOTE — Patient Instructions (Signed)
It was great to see you!  Our plans for today:  - No changes to your medications today. Keep up the great work with your diet and exercise! - We are checking some labs today, we will release these results to your MyChart. - Come back in 3 months.  Take care and seek immediate care sooner if you develop any concerns.   Dr. Ky Barban

## 2020-02-19 NOTE — Progress Notes (Addendum)
John Benson presents today for a Titrate titration.  He is no longer having spontaneous erections.   He has had no response to PDE5i's.   He denies any history of sickle cell anemia or trait, a history of multiple myeloma or a history of leukemia.    He has not taken trazodone or a PDE5i's today.    Phallus is examined and a plaque is noted left corpus near the base 9 mm x 3 mm.  Another plaque is appreciated in the right corpus near the glans 5 mm x 5 mm.  No significant curve to interfere with intercourse is noted, but the penis is rotated 90 degrees.    I explained and demonstrated how to locate the corpus cavernosum and how to avoid the dorsal vein due to his penile torsion.   Patient's right corpus cavernosum is identified.  An area near the base of the penis is cleansed with rubbing alcohol.  Careful to avoid the dorsal vein, 4 mcg of Trimix (papaverine 30 mg, phentolamine 1 mg and prostaglandin E1 10 mcg, Lot # 01/26/2020_0  exp # 03/03/2020 with another Trimix (30/1/10) Lot# 01/30/2020_1  exp 03/31/2020) is injected at a 90 degree angle into the right corpus cavernosum near the base of the penis.  Patient experienced a semi-firm erection in 15 minutes.    He is comfortable with the injection process and will inject himself sometime in the next few days.  I advised him to start with 6 mcg of the Trimix and then contact me with the results.  I also advised him to inject himself either Monday through Thursday morning, so if he experiences priapism he can start the detumescence process here in our office.  I also recommended that he purchase some Sudafed to have on hand to take if he should experience priapism.  Advised patient of the condition of priapism, painful erection lasting for more than four hours, and to contact the office immediately or seek treatment in the ED

## 2020-02-19 NOTE — Assessment & Plan Note (Signed)
Again recommended statin given diabetes, patient refuses for now. Rechecking LDL today.

## 2020-02-19 NOTE — Assessment & Plan Note (Signed)
Recheck labs today. 

## 2020-02-20 ENCOUNTER — Ambulatory Visit (INDEPENDENT_AMBULATORY_CARE_PROVIDER_SITE_OTHER): Payer: PPO | Admitting: Urology

## 2020-02-20 ENCOUNTER — Encounter: Payer: Self-pay | Admitting: Urology

## 2020-02-20 ENCOUNTER — Other Ambulatory Visit: Payer: Self-pay | Admitting: Family Medicine

## 2020-02-20 VITALS — BP 139/79 | HR 65 | Ht 69.0 in | Wt 210.0 lb

## 2020-02-20 DIAGNOSIS — E7800 Pure hypercholesterolemia, unspecified: Secondary | ICD-10-CM

## 2020-02-20 DIAGNOSIS — E1165 Type 2 diabetes mellitus with hyperglycemia: Secondary | ICD-10-CM

## 2020-02-20 DIAGNOSIS — N529 Male erectile dysfunction, unspecified: Secondary | ICD-10-CM | POA: Diagnosis not present

## 2020-02-20 NOTE — Patient Instructions (Signed)

## 2020-02-21 LAB — TEST AUTHORIZATION: TEST CODE:: 10165

## 2020-02-21 LAB — MICROALBUMIN / CREATININE URINE RATIO
Creatinine, Urine: 81 mg/dL (ref 20–320)
Microalb Creat Ratio: 9 mcg/mg creat (ref <30)
Microalb, Ur: 0.7 mg/dL

## 2020-02-21 LAB — BASIC METABOLIC PANEL WITH GFR
BUN: 25 mg/dL (ref 7–25)
CO2: 25 mmol/L (ref 20–32)
Calcium: 9.3 mg/dL (ref 8.6–10.3)
Chloride: 105 mmol/L (ref 98–110)
Creat: 1.23 mg/dL (ref 0.70–1.25)
GFR, Est African American: 70 mL/min/{1.73_m2} (ref >=60)
GFR, Est Non African American: 60 mL/min/{1.73_m2} (ref >=60)
Glucose, Bld: 107 mg/dL — ABNORMAL HIGH (ref 65–99)
Potassium: 4.8 mmol/L (ref 3.5–5.3)
Sodium: 139 mmol/L (ref 135–146)

## 2020-02-21 LAB — LDL CHOLESTEROL, DIRECT: Direct LDL: 133 mg/dL — ABNORMAL HIGH (ref <100)

## 2020-02-21 MED ORDER — ROSUVASTATIN CALCIUM 5 MG PO TABS: 5.0000 mg | ORAL_TABLET | Freq: Every day | ORAL | 3 refills | Status: DC

## 2020-02-21 NOTE — Addendum Note (Signed)
Addended by: Myles Gip on: 02/21/2020 03:13 PM   Modules accepted: Orders

## 2020-03-26 DIAGNOSIS — E119 Type 2 diabetes mellitus without complications: Secondary | ICD-10-CM | POA: Diagnosis not present

## 2020-03-26 LAB — HM DIABETES EYE EXAM

## 2020-05-29 ENCOUNTER — Other Ambulatory Visit: Payer: Self-pay

## 2020-05-29 ENCOUNTER — Ambulatory Visit (INDEPENDENT_AMBULATORY_CARE_PROVIDER_SITE_OTHER): Payer: PPO

## 2020-05-29 VITALS — BP 132/82 | HR 64 | Temp 97.7°F | Resp 16 | Ht 69.0 in | Wt 217.0 lb

## 2020-05-29 DIAGNOSIS — Z0000 Encounter for general adult medical examination without abnormal findings: Secondary | ICD-10-CM

## 2020-05-29 NOTE — Progress Notes (Addendum)
Subjective:   Belmont Valli. is a 70 y.o. male who presents for Medicare Annual/Subsequent preventive examination.  Review of Systems     Cardiac Risk Factors include: advanced age (>69men, >77 women);diabetes mellitus;male gender;obesity (BMI >30kg/m2);dyslipidemia     Objective:    Today's Vitals   05/29/20 0843  BP: 132/82  Pulse: 64  Resp: 16  Temp: 97.7 F (36.5 C)  TempSrc: Oral  SpO2: 99%  Weight: 217 lb (98.4 kg)  Height: 5\' 9"  (1.753 m)   Body mass index is 32.05 kg/m.  Advanced Directives 05/29/2020 05/29/2019 05/03/2019 01/26/2019 08/18/2018 08/18/2018 08/09/2018  Does Patient Have a Medical Advance Directive? No No No No No No No  Would patient like information on creating a medical advance directive? Yes (MAU/Ambulatory/Procedural Areas - Information given) Yes (MAU/Ambulatory/Procedural Areas - Information given) No - Patient declined No - Patient declined No - Patient declined No - Patient declined No - Patient declined    Current Medications (verified) Outpatient Encounter Medications as of 05/29/2020  Medication Sig  . BAYER ASPIRIN EC LOW DOSE 81 MG EC tablet Take 81 mg by mouth daily.   Marland Kitchen COLACE 100 MG capsule Take 100 mg by mouth daily.   . Multiple Vitamin (MULTIVITAMIN) LIQD Take 10 mLs by mouth daily.  . Omega-3 Fatty Acids (OMEGA-3 FISH OIL PO) Take 1 capsule by mouth daily.  . metFORMIN (GLUCOPHAGE XR) 500 MG 24 hr tablet Take 1 tablet (500 mg total) by mouth daily with breakfast. (Patient not taking: Reported on 05/29/2020)  . rosuvastatin (CRESTOR) 5 MG tablet Take 1 tablet (5 mg total) by mouth daily. (Patient not taking: Reported on 05/29/2020)  . [DISCONTINUED] NONFORMULARY OR COMPOUNDED ITEM Trimix (30/1/10)-(Pap/Phent/PGE)  Test Dose  85ml vial   [address]ty [address] [place] [phone] Fax [phone] (Patient taking differently: Trimix (30/1/10)-(Pap/Phent/PGE)  Test Dose  4ml vial   [address] [place] [phone] Fax [phone])   No facility-administered encounter medications on file as of 05/29/2020.    Allergies (verified) Testosterone   History: Past Medical History:  Diagnosis Date  . Benign essential HTN   . Calculus of kidney   . Cancer ([place])   . Carpal tunnel syndrome   . Carpal tunnel syndrome 07/15/2014  . Chronic kidney disease, stage II (mild)   . Diabetes mellitus without complication ([place])    controlled by diet and exercise  . History of kidney stones   . HTN (hypertension)    maintained by diet and exercise  . Hyperlipidemia   . Hypogonadism in male   . Obesity   . Prostate cancer [facility])    Past Surgical History:  Procedure Laterality Date  . COLONOSCOPY    . LITHOTRIPSY    . PELVIC LYMPH NODE DISSECTION Bilateral 08/18/2018   Procedure: PELVIC LYMPH NODE DISSECTION;  Surgeon: Billey Co, MD;  Location: [facility] ORS;  Service: Urology;  Laterality: Bilateral;  . ROBOT ASSISTED LAPAROSCOPIC RADICAL PROSTATECTOMY N/A 08/18/2018   Procedure: XI ROBOTIC ASSISTED LAPAROSCOPIC RADICAL PROSTATECTOMY;  Surgeon: Billey Co, MD;  Location: [facility] ORS;  Service: Urology;  Laterality: N/A;   Family History  Problem Relation Age of Onset  . Diabetes Mother   . Cancer Mother        breast  . Vision loss Mother   . Hypertension Father   . Dementia Father   . Diabetes Brother   . Early death Brother   . Pancreatic disease Brother   .  Early death Sister   . Kidney disease Sister   . Kidney cancer Neg Hx   . Prostate cancer Neg Hx    Social History   Socioeconomic History  . Marital status: Married    Spouse name: Not on file  . Number of children: Not on file  . Years of education: Not on file  . Highest education level: Not on file  Occupational History  . Not on file  Tobacco Use  . Smoking status: Never Smoker  . Smokeless tobacco: Never Used  Vaping Use  . Vaping Use: Never used  Substance and Sexual Activity  . Alcohol use: No     Alcohol/week: 0.0 standard drinks  . Drug use: No  . Sexual activity: Yes  Other Topics Concern  . Not on file  Social History Narrative  . Not on file   Social Determinants of Health   Financial Resource Strain: Low Risk   . Difficulty of Paying Living Expenses: Not hard at all  Food Insecurity: No Food Insecurity  . Worried About Charity fundraiser in the Last Year: Never true  . Ran Out of Food in the Last Year: Never true  Transportation Needs: No Transportation Needs  . Lack of Transportation (Medical): No  . Lack of Transportation (Non-Medical): No  Physical Activity: Sufficiently Active  . Days of Exercise per Week: 6 days  . Minutes of Exercise per Session: 60 min  Stress: No Stress Concern Present  . Feeling of Stress : Not at all  Social Connections: Moderately Integrated  . Frequency of Communication with Friends and Family: More than three times a week  . Frequency of Social Gatherings with Friends and Family: Three times a week  . Attends Religious Services: More than 4 times per year  . Active Member of Clubs or Organizations: No  . Attends Archivist Meetings: Never  . Marital Status: Married    Tobacco Counseling Counseling given: Not Answered   Clinical Intake:  Pre-visit preparation completed: Yes  Pain : No/denies pain     BMI - recorded: 32.05 Nutritional Status: BMI > 30  Obese Nutritional Risks: None Diabetes: Yes CBG done?: No Did pt. bring in CBG monitor from home?: No  How often do you need to have someone help you when you read instructions, pamphlets, or other written materials from your doctor or pharmacy?: 1 - Never  Nutrition Risk Assessment:  Has the patient had any N/V/D within the last 2 months?  No  Does the patient have any non-healing wounds?  No  Has the patient had any unintentional weight loss or weight gain?  No   Diabetes:  Is the patient diabetic?  Yes  If diabetic, was a CBG obtained today?  No   Did the patient bring in their glucometer from home?  No  How often do you monitor your CBG's? Pt does not actively check blood sugar.   Financial Strains and Diabetes Management:  Are you having any financial strains with the device, your supplies or your medication? No .  Does the patient want to be seen by Chronic Care Management for management of their diabetes?  No  Would the patient like to be referred to a Nutritionist or for Diabetic Management?  No   Diabetic Exams:  Diabetic Eye Exam: Completed 03/26/20 negative retinopathy.   Diabetic Foot Exam: Completed 02/19/20.   Interpreter Needed?: No  Information entered by :: Clemetine Marker LPN   Activities of Daily Living  In your present state of health, do you have any difficulty performing the following activities: 05/29/2020 10/05/2019  Hearing? N N  Comment declines hearing aids -  Vision? N N  Difficulty concentrating or making decisions? N N  Walking or climbing stairs? N N  Dressing or bathing? N N  Doing errands, shopping? N N  Preparing Food and eating ? N -  Using the Toilet? N -  In the past six months, have you accidently leaked urine? Y -  Comment occasional due to prostate removal -  Do you have problems with loss of bowel control? N -  Managing your Medications? N -  Managing your Finances? N -  Housekeeping or managing your Housekeeping? N -  Some recent data might be hidden    Patient Care Team: Matthews as PCP - General (Family Medicine) Noreene Filbert, MD as Radiation Oncologist (Radiation Oncology) Billey Co, MD as Consulting Physician (Urology)  Indicate any recent Medical Services you may have received from other than [facility] providers in the past year (date may be approximate).     Assessment:   This is a routine wellness examination for Gwyndolyn Saxon.  Hearing/Vision screen  Hearing Screening   125Hz  250Hz  500Hz  1000Hz  2000Hz  3000Hz  4000Hz  6000Hz  8000Hz   Right ear:            Left ear:           Comments: Pt denies hearing difficulty  Vision Screening Comments: Annual vision screenings done at [place] issues and exercise activities discussed: Current Exercise Habits: Home exercise routine, Type of exercise: walking;calisthenics, Time (Minutes): 60, Frequency (Times/Week): 6, Weekly Exercise (Minutes/Week): 360, Intensity: Moderate, Exercise limited by: None identified  Goals Addressed   None    Depression Screen PHQ 2/9 Scores 05/29/2020 02/19/2020 10/05/2019 07/05/2019 05/29/2019 01/30/2019 07/27/2018  PHQ - 2 Score 0 0 0 0 0 0 0  PHQ- 9 Score - - - 0 - 0 0    Fall Risk Fall Risk  05/29/2020 02/19/2020 10/05/2019 07/05/2019 05/29/2019  Falls in the past year? 0 0 0 0 0  Number falls in past yr: 0 0 0 0 0  Injury with Fall? 0 0 0 0 0  Risk for fall due to : No Fall Risks - - - No Fall Risks  Follow up Falls prevention discussed - Falls evaluation completed - Falls prevention discussed    FALL RISK PREVENTION PERTAINING TO THE HOME:  Any stairs in or around the home? No  If so, are there any without handrails? No  Home free of loose throw rugs in walkways, pet beds, electrical cords, etc? Yes  Adequate lighting in your home to reduce risk of falls? Yes   ASSISTIVE DEVICES UTILIZED TO PREVENT FALLS:  Life alert? No  Use of a cane, walker or w/c? No  Grab bars in the bathroom? Yes  Shower chair or bench in shower? No  Elevated toilet seat or a handicapped toilet? Yes   TIMED UP AND GO:  Was the test performed? Yes .  Length of time to ambulate 10 feet: 5 sec.   Gait steady and fast without use of assistive device  Cognitive Function: Normal cognitive status assessed by direct observation by this Nurse Health Advisor. No abnormalities found.       6CIT Screen 05/25/2018  What Year? 0 points  What month? 0 points  What time? 0 points  Count back from 20 0 points  Months in  reverse 0 points  Repeat phrase 2 points  Total  Score 2    Immunizations Immunization History  Administered Date(s) Administered  . Influenza, High Dose Seasonal PF 10/08/2018  . Influenza, Seasonal, Injecte, Preservative Fre 10/12/2013  . Influenza,inj,Quad PF,6+ Mos 10/26/2012, 09/30/2015, 10/22/2016, 09/29/2017  . Influenza-Unspecified 09/25/2014, 09/29/2017, 09/29/2019  . Moderna Sars-Covid-2 Vaccination 01/11/2019, 02/08/2019, 10/27/2019, 04/12/2020  . Pneumococcal Conjugate-13 04/30/2013  . Pneumococcal Polysaccharide-23 07/21/2010, 02/19/2020  . Tdap 07/21/2010    TDAP status: Up to date  Flu Vaccine status: Up to date  Pneumococcal vaccine status: Up to date  Covid-19 vaccine status: Completed vaccines  Qualifies for Shingles Vaccine? Yes   Zostavax completed No   Shingrix Completed?: No.    Education has been provided regarding the importance of this vaccine. Patient has been advised to call insurance company to determine out of pocket expense if they have not yet received this vaccine. Advised may also receive vaccine at local pharmacy or Health Dept. Verbalized acceptance and understanding.  Screening Tests Health Maintenance  Topic Date Due  . TETANUS/TDAP  07/20/2020  . INFLUENZA VACCINE  08/04/2020  . HEMOGLOBIN A1C  08/18/2020  . FOOT EXAM  02/18/2021  . URINE MICROALBUMIN  02/18/2021  . OPHTHALMOLOGY EXAM  03/26/2021  . COLONOSCOPY (Pts 45-42yrs Insurance coverage will need to be confirmed)  12/26/2023  . COVID-19 Vaccine  Completed  . Hepatitis C Screening  Completed  . PNA vac Low Risk Adult  Completed  . HPV VACCINES  Aged Out    Health Maintenance  There are no preventive care reminders to display for this patient.  Colorectal cancer screening: Type of screening: Colonoscopy. Completed 12/25/13. Repeat every 10 years  Lung Cancer Screening: (Low Dose CT Chest recommended if Age 6-80 years, 30 pack-year currently smoking OR have quit w/in 15years.) does not qualify.   Additional  Screening:  Hepatitis C Screening: does qualify; Completed 06/22/11  Vision Screening: Recommended annual ophthalmology exams for early detection of glaucoma and other disorders of the eye. Is the patient up to date with their annual eye exam?  Yes  Who is the provider or what is the name of the office in which the patient attends annual eye exams? [facility].   Dental Screening: Recommended annual dental exams for proper oral hygiene  Community Resource Referral / Chronic Care Management: CRR required this visit?  No   CCM required this visit?  No      Plan:     I have personally reviewed and noted the following in the patient's chart:   . Medical and social history . Use of alcohol, tobacco or illicit drugs  . Current medications and supplements including opioid prescriptions. Patient is not currently taking opioid prescriptions. . Functional ability and status . Nutritional status . Physical activity . Advanced directives . List of other physicians . Hospitalizations, surgeries, and ER visits in previous 12 months . Vitals . Screenings to include cognitive, depression, and falls . Referrals and appointments  In addition, I have reviewed and discussed with patient certain preventive protocols, quality metrics, and best practice recommendations. A written personalized care plan for preventive services as well as general preventive health recommendations were provided to patient.     Clemetine Marker, LPN   02/05/5425   Nurse Notes: none

## 2020-05-29 NOTE — Patient Instructions (Signed)
John Benson , Thank you for taking time to come for your Medicare Wellness Visit. I appreciate your ongoing commitment to your health goals. Please review the following plan we discussed and let me know if I can assist you in the future.   Screening recommendations/referrals: Colonoscopy: done 12/25/13 Recommended yearly ophthalmology/optometry visit for glaucoma screening and checkup Recommended yearly dental visit for hygiene and checkup  Vaccinations: Influenza vaccine: done 09/29/19 Pneumococcal vaccine: done 02/19/20 Tdap vaccine: done 07/21/10 Shingles vaccine: Shingrix discussed. Please contact your pharmacy for coverage information.  Covid-19:  Done 01/11/19, 02/08/19, 10/27/19 & 04/12/20  Advanced directives: Advance directive discussed with you today. I have provided a copy for you to complete at home and have notarized. Once this is complete please bring a copy in to our office so we can scan it into your chart.  Conditions/risks identified: Keep up the great work!  Next appointment: Follow up in one year for your annual wellness visit.   Preventive Care 23 Years and Older, Male Preventive care refers to lifestyle choices and visits with your health care provider that can promote health and wellness. What does preventive care include?  A yearly physical exam. This is also called an annual well check.  Dental exams once or twice a year.  Routine eye exams. Ask your health care provider how often you should have your eyes checked.  Personal lifestyle choices, including:  Daily care of your teeth and gums.  Regular physical activity.  Eating a healthy diet.  Avoiding tobacco and drug use.  Limiting alcohol use.  Practicing safe sex.  Taking low doses of aspirin every day.  Taking vitamin and mineral supplements as recommended by your health care provider. What happens during an annual well check? The services and screenings done by your health care provider during your  annual well check will depend on your age, overall health, lifestyle risk factors, and family history of disease. Counseling  Your health care provider may ask you questions about your:  Alcohol use.  Tobacco use.  Drug use.  Emotional well-being.  Home and relationship well-being.  Sexual activity.  Eating habits.  History of falls.  Memory and ability to understand (cognition).  Work and work Statistician. Screening  You may have the following tests or measurements:  Height, weight, and BMI.  Blood pressure.  Lipid and cholesterol levels. These may be checked every 5 years, or more frequently if you are over 38 years old.  Skin check.  Lung cancer screening. You may have this screening every year starting at age 56 if you have a 30-pack-year history of smoking and currently smoke or have quit within the past 15 years.  Fecal occult blood test (FOBT) of the stool. You may have this test every year starting at age 64.  Flexible sigmoidoscopy or colonoscopy. You may have a sigmoidoscopy every 5 years or a colonoscopy every 10 years starting at age 65.  Prostate cancer screening. Recommendations will vary depending on your family history and other risks.  Hepatitis C blood test.  Hepatitis B blood test.  Sexually transmitted disease (STD) testing.  Diabetes screening. This is done by checking your blood sugar (glucose) after you have not eaten for a while (fasting). You may have this done every 1-3 years.  Abdominal aortic aneurysm (AAA) screening. You may need this if you are a current or former smoker.  Osteoporosis. You may be screened starting at age 76 if you are at high risk. Talk with your health  care provider about your test results, treatment options, and if necessary, the need for more tests. Vaccines  Your health care provider may recommend certain vaccines, such as:  Influenza vaccine. This is recommended every year.  Tetanus, diphtheria, and  acellular pertussis (Tdap, Td) vaccine. You may need a Td booster every 10 years.  Zoster vaccine. You may need this after age 16.  Pneumococcal 13-valent conjugate (PCV13) vaccine. One dose is recommended after age 35.  Pneumococcal polysaccharide (PPSV23) vaccine. One dose is recommended after age 65. Talk to your health care provider about which screenings and vaccines you need and how often you need them. This information is not intended to replace advice given to you by your health care provider. Make sure you discuss any questions you have with your health care provider. Document Released: 01/17/2015 Document Revised: 09/10/2015 Document Reviewed: 10/22/2014 Elsevier Interactive Patient Education  2017 [employer] Prevention in the Home Falls can cause injuries. They can happen to people of all ages. There are many things you can do to make your home safe and to help prevent falls. What can I do on the outside of my home?  Regularly fix the edges of walkways and driveways and fix any cracks.  Remove anything that might make you trip as you walk through a door, such as a raised step or threshold.  Trim any bushes or trees on the path to your home.  Use bright outdoor lighting.  Clear any walking paths of anything that might make someone trip, such as rocks or tools.  Regularly check to see if handrails are loose or broken. Make sure that both sides of any steps have handrails.  Any raised decks and porches should have guardrails on the edges.  Have any leaves, snow, or ice cleared regularly.  Use sand or salt on walking paths during winter.  Clean up any spills in your garage right away. This includes oil or grease spills. What can I do in the bathroom?  Use night lights.  Install grab bars by the toilet and in the tub and shower. Do not use towel bars as grab bars.  Use non-skid mats or decals in the tub or shower.  If you need to sit down in the shower, use  a plastic, non-slip stool.  Keep the floor dry. Clean up any water that spills on the floor as soon as it happens.  Remove soap buildup in the tub or shower regularly.  Attach bath mats securely with double-sided non-slip rug tape.  Do not have throw rugs and other things on the floor that can make you trip. What can I do in the bedroom?  Use night lights.  Make sure that you have a light by your bed that is easy to reach.  Do not use any sheets or blankets that are too big for your bed. They should not hang down onto the floor.  Have a firm chair that has side arms. You can use this for support while you get dressed.  Do not have throw rugs and other things on the floor that can make you trip. What can I do in the kitchen?  Clean up any spills right away.  Avoid walking on wet floors.  Keep items that you use a lot in easy-to-reach places.  If you need to reach something above you, use a strong step stool that has a grab bar.  Keep electrical cords out of the way.  Do not use floor  polish or wax that makes floors slippery. If you must use wax, use non-skid floor wax.  Do not have throw rugs and other things on the floor that can make you trip. What can I do with my stairs?  Do not leave any items on the stairs.  Make sure that there are handrails on both sides of the stairs and use them. Fix handrails that are broken or loose. Make sure that handrails are as long as the stairways.  Check any carpeting to make sure that it is firmly attached to the stairs. Fix any carpet that is loose or worn.  Avoid having throw rugs at the top or bottom of the stairs. If you do have throw rugs, attach them to the floor with carpet tape.  Make sure that you have a light switch at the top of the stairs and the bottom of the stairs. If you do not have them, ask someone to add them for you. What else can I do to help prevent falls?  Wear shoes that:  Do not have high heels.  Have  rubber bottoms.  Are comfortable and fit you well.  Are closed at the toe. Do not wear sandals.  If you use a stepladder:  Make sure that it is fully opened. Do not climb a closed stepladder.  Make sure that both sides of the stepladder are locked into place.  Ask someone to hold it for you, if possible.  Clearly mark and make sure that you can see:  Any grab bars or handrails.  First and last steps.  Where the edge of each step is.  Use tools that help you move around (mobility aids) if they are needed. These include:  Canes.  Walkers.  Scooters.  Crutches.  Turn on the lights when you go into a dark area. Replace any light bulbs as soon as they burn out.  Set up your furniture so you have a clear path. Avoid moving your furniture around.  If any of your floors are uneven, fix them.  If there are any pets around you, be aware of where they are.  Review your medicines with your doctor. Some medicines can make you feel dizzy. This can increase your chance of falling. Ask your doctor what other things that you can do to help prevent falls. This information is not intended to replace advice given to you by your health care provider. Make sure you discuss any questions you have with your health care provider. Document Released: 10/17/2008 Document Revised: 05/29/2015 Document Reviewed: 01/25/2014 Elsevier Interactive Patient Education  2017 [employer].

## 2020-06-13 ENCOUNTER — Other Ambulatory Visit: Payer: Self-pay | Admitting: *Deleted

## 2020-06-13 DIAGNOSIS — C61 Malignant neoplasm of prostate: Secondary | ICD-10-CM

## 2020-07-30 ENCOUNTER — Encounter: Payer: Self-pay | Admitting: Urology

## 2020-07-30 ENCOUNTER — Ambulatory Visit
Admission: RE | Admit: 2020-07-30 | Discharge: 2020-07-30 | Disposition: A | Discharge status: Home / Self Care | Payer: PPO | Source: Ambulatory Visit | Attending: Radiation Oncology | Admitting: Radiation Oncology

## 2020-07-30 ENCOUNTER — Ambulatory Visit: Payer: PPO | Admitting: Urology

## 2020-07-30 ENCOUNTER — Encounter: Payer: Self-pay | Admitting: Radiation Oncology

## 2020-07-30 ENCOUNTER — Other Ambulatory Visit: Payer: Self-pay

## 2020-07-30 VITALS — BP 154/89 | HR 58 | Ht 69.5 in | Wt 224.0 lb

## 2020-07-30 VITALS — BP 161/90 | HR 69 | Resp 18 | Wt 224.0 lb

## 2020-07-30 DIAGNOSIS — Z923 Personal history of irradiation: Secondary | ICD-10-CM | POA: Insufficient documentation

## 2020-07-30 DIAGNOSIS — Z08 Encounter for follow-up examination after completed treatment for malignant neoplasm: Secondary | ICD-10-CM | POA: Diagnosis not present

## 2020-07-30 DIAGNOSIS — N529 Male erectile dysfunction, unspecified: Secondary | ICD-10-CM

## 2020-07-30 DIAGNOSIS — C61 Malignant neoplasm of prostate: Secondary | ICD-10-CM

## 2020-07-30 DIAGNOSIS — N393 Stress incontinence (female) (male): Secondary | ICD-10-CM | POA: Diagnosis not present

## 2020-07-30 MED ORDER — SILDENAFIL CITRATE 100 MG PO TABS: 100.0000 mg | ORAL_TABLET | Freq: Every day | ORAL | 6 refills | Status: DC | PRN

## 2020-07-30 NOTE — Progress Notes (Signed)
Radiation Oncology Follow up Note  Name: John Benson.   Date:   07/30/2020 MRN:  [id number] DOB: 1950-07-09    This 70 y.o. male presents to the clinic today for 86-monthfollow-up status post salvage radiation therapy to his prostatic fossa status post robotic assisted prostatectomy for Gleason 9 (4+5) adenocarcinoma.  REFERRING PROVIDER: No ref. provider found  HPI: Patient is a 70year old male now about 616 months having completed salvage radiation therapy to his prostatic fossa and pelvic nodes for Gleason 9 adenocarcinoma the prostate status post robotic assisted prostatectomy.  Seen today in routine follow-up he is doing well.  He specifically denies any increased lower urinary tract symptoms diarrhea or fatigue.  Does occasionally have some stress incontinence which started after his prostatectomy..Marland Kitchen His last PSA was less than 0.16 months prior he had another PSA today which has not yet been finalized.  COMPLICATIONS OF TREATMENT: none  FOLLOW UP COMPLIANCE: keeps appointments   PHYSICAL EXAM:  BP (!) 161/90 (BP Location: Left Arm, Patient Position: Sitting)   Pulse 69   Resp 18   Wt 224 lb (101.6 kg)   SpO2 100%   BMI 32.60 kg/m  Well-developed well-nourished patient in NAD. HEENT reveals PERLA, EOMI, discs not visualized.  Oral cavity is clear. No oral mucosal lesions are identified. Neck is clear without evidence of cervical or supraclavicular adenopathy. Lungs are clear to A&P. Cardiac examination is essentially unremarkable with regular rate and rhythm without murmur rub or thrill. Abdomen is benign with no organomegaly or masses noted. Motor sensory and DTR levels are equal and symmetric in the upper and lower extremities. Cranial nerves II through XII are grossly intact. Proprioception is intact. No peripheral adenopathy or edema is identified. No motor or sensory levels are noted. Crude visual fields are within normal range.  RADIOLOGY RESULTS: No current films to  review  PLAN: Present time patient is doing well from a clinical standpoint.  He continues close follow-up care with urology.  We will report his PSA which she had today when it is available.  I have otherwise asked to see him back in 1 year for follow-up.  Should he show any indication of increasing PSA will alter his follow-up visits.  Patient knows to call with any concerns.  I would like to take this opportunity to thank you for allowing me to participate in the care of your patient..Noreene Filbert MD

## 2020-07-30 NOTE — Progress Notes (Signed)
   07/30/2020 8:51 AM   John Benson. 31-Aug-1950 [id number]  Reason for visit: Follow up high risk prostate cancer, ED   HPI: He is a 70 year old African-American male who had a long history of significantly elevated PSA before following up with urology and undergoing a prostate biopsy on 05/18/2018 for an elevated PSA of 20.  This showed high risk prostate cancer, and staging imaging showed no evidence of metastatic disease.He then underwent a robotic prostatectomy and bilateral lymphadenectomy on 08/18/2018 with final pathology showing no lymph node involvement, however Gleason score 4+5=9 stage pT3a prostate adenocarcinoma with extraprostatic extension, lymphovascular invasion, perineural invasion, and positive margins at the left apex, and bilaterally.  Carcinoma was present within the vascular bundles outside the muscular layer of the right SV.   His initial post-op PSA in October 2020 was undetectable, however PSA in January 2021 was 0.2, and confirmed at 0.2 on repeat later that week.  He underwent salvage radiation with Dr. Baruch Gouty that was completed in March 2021.    Post radiation PSA was undetectable.   At baseline prior to surgery he had moderate to severe ED that was only minimally improved with PDE5 inhibitors.  He has previously tried Cialis postoperatively with no improvement in ED, as well as penile injections with [name], PA.  He had significant pain with the penile injections and is not interested in pursuing this further.  He would like a another trial of PDE 5 inhibitors.  We also discussed penile implants, but he is not interested at this time.   He is doing well from a urination perspective.  He has minimal leakage, and wears a pad overnight just for safety.  He has some urgency if he drinks a lot of fluids.  He continues to urinate with a strong stream.  Denies significant stress incontinence.   PSA today pending.  We discussed the risk of recurrence with his  high risk disease, and the need for close PSA monitoring moving forward.  Would recommend continuing every 6 months for at least 2 years after radiation.  Trial of Viagra 100 mg as needed PSA today, call with results Continue close PSA monitoring every 6 months for at least 2 years after radiation(radiation completed March 2021)  Billey Co, MD  [facility] [address] [place], [place] [address] [phone]

## 2020-07-31 LAB — PSA: Prostate Specific Ag, Serum: <0.1 ng/mL (ref 0.0–4.0)

## 2020-11-03 ENCOUNTER — Other Ambulatory Visit: Payer: Self-pay

## 2020-11-03 ENCOUNTER — Ambulatory Visit (INDEPENDENT_AMBULATORY_CARE_PROVIDER_SITE_OTHER): Payer: PPO | Admitting: Family Medicine

## 2020-11-03 ENCOUNTER — Encounter: Payer: Self-pay | Admitting: Family Medicine

## 2020-11-03 VITALS — BP 142/70 | HR 70 | Temp 97.9°F | Resp 16 | Ht 70.0 in | Wt 229.1 lb

## 2020-11-03 DIAGNOSIS — R030 Elevated blood-pressure reading, without diagnosis of hypertension: Secondary | ICD-10-CM | POA: Diagnosis not present

## 2020-11-03 DIAGNOSIS — E1165 Type 2 diabetes mellitus with hyperglycemia: Secondary | ICD-10-CM

## 2020-11-03 DIAGNOSIS — E7800 Pure hypercholesterolemia, unspecified: Secondary | ICD-10-CM

## 2020-11-03 DIAGNOSIS — C61 Malignant neoplasm of prostate: Secondary | ICD-10-CM

## 2020-11-03 DIAGNOSIS — N182 Chronic kidney disease, stage 2 (mild): Secondary | ICD-10-CM

## 2020-11-03 NOTE — Assessment & Plan Note (Addendum)
Noted on exam. Monitor BP at home, f/u in 1 month with log. Consider initiating ARB.

## 2020-11-03 NOTE — Assessment & Plan Note (Signed)
Recheck labs 

## 2020-11-03 NOTE — Assessment & Plan Note (Signed)
Recommend statin given DM2 and LDL>70, patient declines. Recheck lipid panel.

## 2020-11-03 NOTE — Patient Instructions (Signed)
It was great to see you!  Our plans for today:  - Keep an eye on your blood pressure at home. Write down your measurements and bring this with you in 1 month for follow up.  - We are checking some labs today, we will release these results to your MyChart.  Take care and seek immediate care sooner if you develop any concerns.   Dr. Ky Barban

## 2020-11-03 NOTE — Assessment & Plan Note (Signed)
Recheck A1c and adjust as indicated. 

## 2020-11-03 NOTE — Assessment & Plan Note (Signed)
S/p salvage radiation therapy. Continue following with Urology.

## 2020-11-03 NOTE — Progress Notes (Signed)
   SUBJECTIVE:   CHIEF COMPLAINT / HPI:   Diabetes, Type 2 - Last A1c 5.8 02/2020 - Medications: none, previously on metformin but able to d/c - Compliance: n/a - Checking BG at home: no - Eye exam: UTD - Foot exam: UTD - Microalbumin: UTD - Statin: no - PNA vaccine: UTD  Elevated BP: - Medications: none - Compliance: n/a - Checking BP at home: no - Denies any LE edema  ED - follows with urology. S/p salvage radiation therapy for Gleason 9 adenocarcinoma. Last PSA <0.1 07/2020. Previously trialed Cialis 20mg  without much effect. Taking trimix injections instead with good effect. Urinating ok, strong stream. No hematuria.     CHRONIC KIDNEY DISEASE CKD status: stable Medications renally dose: yes Pneumovax:  up to Date Influenza Vaccine:  Up to Date  OBJECTIVE:   BP (!) 142/76   Pulse 70   Temp 97.9 F (36.6 C) (Oral)   Resp 16   Ht 5\' 10"  (1.778 m)   Wt 229 lb 1.6 oz (103.9 kg)   SpO2 94%   BMI 32.87 kg/m   Gen: well appearing, in NAD Card: RRR Lungs: CTAB Ext: WWP, no edema  ASSESSMENT/PLAN:   Type 2 diabetes mellitus with hyperglycemia, without long-term current use of insulin ([name]) Recheck A1c and adjust as indicated.   Prostate cancer [facility]) S/p salvage radiation therapy. Continue following with Urology.  HLD (hyperlipidemia) Recommend statin given DM2 and LDL>70, patient declines. Recheck lipid panel.   Chronic kidney disease, stage II (mild) Recheck labs.   Elevated BP without diagnosis of hypertension Noted on exam. Monitor BP at home, f/u in 1 month with log. Consider initiating ARB if remains elevated on f/u.     Myles Gip, DO

## 2020-11-04 LAB — LIPID PANEL
Cholesterol: 219 mg/dL — ABNORMAL HIGH (ref <200)
HDL: 59 mg/dL (ref >=40)
LDL Cholesterol (Calc): 135 mg/dL (calc) — ABNORMAL HIGH
Non-HDL Cholesterol (Calc): 160 mg/dL (calc) — ABNORMAL HIGH (ref <130)
Total CHOL/HDL Ratio: 3.7 (calc) (ref <5.0)
Triglycerides: 125 mg/dL (ref <150)

## 2020-11-04 LAB — BASIC METABOLIC PANEL
BUN: 18 mg/dL (ref 7–25)
CO2: 27 mmol/L (ref 20–32)
Calcium: 9.7 mg/dL (ref 8.6–10.3)
Chloride: 103 mmol/L (ref 98–110)
Creat: 1.29 mg/dL (ref 0.70–1.35)
Glucose, Bld: 123 mg/dL — ABNORMAL HIGH (ref 65–99)
Potassium: 4.6 mmol/L (ref 3.5–5.3)
Sodium: 138 mmol/L (ref 135–146)

## 2020-11-04 LAB — HEMOGLOBIN A1C
Hgb A1c MFr Bld: 6.2 % of total Hgb — ABNORMAL HIGH (ref <5.7)
Mean Plasma Glucose: 131 mg/dL
eAG (mmol/L): 7.3 mmol/L

## 2020-11-04 MED ORDER — TELMISARTAN 20 MG PO TABS: 20.0000 mg | ORAL_TABLET | Freq: Every day | ORAL | 0 refills | Status: DC

## 2020-11-04 NOTE — Addendum Note (Signed)
Addended by: Myles Gip on: 11/04/2020 11:51 AM   Modules accepted: Orders

## 2020-12-02 ENCOUNTER — Ambulatory Visit (INDEPENDENT_AMBULATORY_CARE_PROVIDER_SITE_OTHER): Payer: PPO | Admitting: Family Medicine

## 2020-12-02 ENCOUNTER — Other Ambulatory Visit: Payer: Self-pay

## 2020-12-02 ENCOUNTER — Encounter: Payer: Self-pay | Admitting: Family Medicine

## 2020-12-02 VITALS — BP 128/84 | HR 68 | Temp 97.7°F | Resp 16 | Ht 70.0 in | Wt 233.8 lb

## 2020-12-02 DIAGNOSIS — R030 Elevated blood-pressure reading, without diagnosis of hypertension: Secondary | ICD-10-CM | POA: Diagnosis not present

## 2020-12-02 DIAGNOSIS — E1165 Type 2 diabetes mellitus with hyperglycemia: Secondary | ICD-10-CM

## 2020-12-02 MED ORDER — BLOOD GLUCOSE METER KIT
PACK | 0 refills | Status: DC
Start: 2020-12-02 — End: 2021-03-05

## 2020-12-02 NOTE — Patient Instructions (Signed)
It was great to see you!  Our plans for today:  - Keep an eye on your blood pressure. Bring this log with you to follow up.  - We will hold off on the medicine for now. - Come back in 3 months.  Take care and seek immediate care sooner if you develop any concerns.   Dr. Ky Barban

## 2020-12-02 NOTE — Assessment & Plan Note (Signed)
ARB started after last visit for renal protection given DM2 and elevated BP however patient did not start. Elects to control BP with lifestyle factors. Given A1c remains at goal and at home BP at goal, believe reasonable to continue to monitor for now. F/u in 3 months. Obtain BMP at that time, if renal function or BP worsened, revisit ARB.

## 2020-12-02 NOTE — Progress Notes (Signed)
   SUBJECTIVE:   CHIEF COMPLAINT / HPI:   Elevated BP: - Medications: telmisartan (started mainly for renal protection) - Compliance: hasn't taken yet - Checking BP at home: yes, 110-140. Mainly 120s.  - Denies any LE edema, medication SEs, or symptoms of hypotension - working on diet and exercise with wife   OBJECTIVE:   BP 128/84   Pulse 68   Temp 97.7 F (36.5 C)   Resp 16   Ht 5\' 10"  (1.778 m)   Wt 233 lb 12.8 oz (106.1 kg)   SpO2 99%   BMI 33.55 kg/m   Gen: well appearing, in NAD Card: Reg rate Lungs: comfortable WOB on RA Ext: WWP  ASSESSMENT/PLAN:   Elevated BP without diagnosis of hypertension ARB started after last visit for renal protection given DM2 and elevated BP however patient did not start. Elects to control BP with lifestyle factors. Given A1c remains at goal and at home BP at goal, believe reasonable to continue to monitor for now. F/u in 3 months. Obtain BMP at that time, if renal function or BP worsened, revisit ARB.      Myles Gip, DO

## 2020-12-28 ENCOUNTER — Other Ambulatory Visit: Payer: Self-pay | Admitting: Family Medicine

## 2020-12-30 NOTE — Telephone Encounter (Signed)
Requested Prescriptions  Pending Prescriptions Disp Refills   FREESTYLE LITE test strip [Pharmacy Med Name: FREESTYLE LITE TEST STRIP] 100 strip 3    Sig: USE UP TO 4 TIMES DAILY AS DIRECTED (E11.9)     Endocrinology: Diabetes - Testing Supplies Passed - 12/28/2020  9:12 AM      Passed - Valid encounter within last 12 months    Recent Outpatient Visits          4 weeks ago Type 2 diabetes mellitus with hyperglycemia, without long-term current use of insulin [facility])   [facility] Rory Percy M, DO   1 month ago Type 2 diabetes mellitus with hyperglycemia, without long-term current use of insulin [facility])   [facility] Rory Percy M, DO   10 months ago Type 2 diabetes mellitus with hyperglycemia, without long-term current use of insulin [facility])   [facility] Rory Percy M, DO   1 year ago Type 2 diabetes mellitus with hyperglycemia, without long-term current use of insulin [facility])   [facility] Lebron Conners D, MD   1 year ago Type 2 diabetes mellitus with hyperglycemia, without long-term current use of insulin [facility])   [facility] Towanda Malkin, MD      Future Appointments            In 2 months Reece Packer, Myna Hidalgo, [facility], [place]   In 5 months  [facility], [facility]

## 2021-01-30 ENCOUNTER — Other Ambulatory Visit: Payer: Self-pay

## 2021-01-30 ENCOUNTER — Other Ambulatory Visit: Payer: PPO

## 2021-01-30 DIAGNOSIS — C61 Malignant neoplasm of prostate: Secondary | ICD-10-CM

## 2021-01-31 LAB — PSA: Prostate Specific Ag, Serum: <0.1 ng/mL (ref 0.0–4.0)

## 2021-02-05 ENCOUNTER — Ambulatory Visit: Payer: PPO | Admitting: Urology

## 2021-02-05 ENCOUNTER — Other Ambulatory Visit: Payer: Self-pay

## 2021-02-05 ENCOUNTER — Encounter: Payer: Self-pay | Admitting: Urology

## 2021-02-05 VITALS — BP 189/85 | HR 85 | Ht 70.0 in | Wt 235.0 lb

## 2021-02-05 DIAGNOSIS — N529 Male erectile dysfunction, unspecified: Secondary | ICD-10-CM

## 2021-02-05 DIAGNOSIS — C61 Malignant neoplasm of prostate: Secondary | ICD-10-CM

## 2021-02-05 NOTE — Patient Instructions (Signed)
You can order a Cunningham clamp or Weisner clamp from [employer] to help with leakage when you are physically active.  These typically cost $20-$30, and can be very helpful for men with history of prostate cancer

## 2021-02-05 NOTE — Progress Notes (Signed)
° °  02/05/2021 10:27 AM   John Benson. Nov 07, 1950 [id number]  Reason for visit: Follow up high risk prostate cancer, ED, stress incontinence  HPI: He is a 71 year old African-American male who had a long history of significantly elevated PSA before following up with urology and undergoing a prostate biopsy on 05/18/2018 for an elevated PSA of 20.  This showed high risk prostate cancer, and staging imaging showed no evidence of metastatic disease.He then underwent a robotic prostatectomy and bilateral lymphadenectomy on 08/18/2018 with final pathology showing no lymph node involvement, however Gleason score 4+5=9 stage pT3a prostate adenocarcinoma with extraprostatic extension, lymphovascular invasion, perineural invasion, and positive margins at the left apex, and bilaterally.  Carcinoma was present within the vascular bundles outside the muscular layer of the right SV.   His initial post-op PSA in October 2020 was undetectable, however PSA in January 2021 was 0.2, and confirmed at 0.2 on repeat later that week.  He underwent salvage radiation with Dr. Baruch Gouty that was completed in March 2021.    Post radiation PSA was undetectable, and remains undetectable on most recent PSA dated 01/30/2021.   At baseline prior to surgery he had moderate to severe ED that was only minimally improved with PDE5 inhibitors.  He has previously tried Cialis postoperatively with no improvement in ED, as well as penile injections with [name], PA.  He had significant pain with the penile injections and is not interested in pursuing this further.  At our last visit he opted for another trial of 100 mg sildenafil on demand, but has not had much improvement with this.  We again reviewed penile prosthesis as an option in the future, but he is not interested in this time.   He is doing well from a urination perspective.  He has mild leakage, and wears a pad with physical activity.  We discussed trial of a Cunningham  clamp if he has worsening of his stress incontinence.   We discussed the risk of recurrence with his high risk disease, and the need for close PSA monitoring moving forward.  Would recommend continuing every 6 months for at least 2 years after radiation.   Lab visit for PSA in 6 months, call with results.  If remains undetectable will move to yearly [place], [place] [address] [place],  [address] [phone]

## 2021-02-10 ENCOUNTER — Other Ambulatory Visit: Payer: Self-pay | Admitting: Family Medicine

## 2021-02-10 DIAGNOSIS — E1165 Type 2 diabetes mellitus with hyperglycemia: Secondary | ICD-10-CM

## 2021-02-10 DIAGNOSIS — R030 Elevated blood-pressure reading, without diagnosis of hypertension: Secondary | ICD-10-CM

## 2021-02-10 NOTE — Telephone Encounter (Signed)
Requested Prescriptions  Pending Prescriptions Disp Refills   telmisartan (MICARDIS) 20 MG tablet [Pharmacy Med Name: TELMISARTAN 20 MG TABLET] 90 tablet 0    Sig: TAKE 1 TABLET BY MOUTH EVERY DAY     Cardiovascular:  Angiotensin Receptor Blockers Failed - 02/10/2021  1:29 AM      Failed - Last BP in normal range    BP Readings from Last 1 Encounters:  02/05/21 (!) 189/85         Passed - Cr in normal range and within 180 days    Creat  Date Value Ref Range Status  11/03/2020 1.29 0.70 - 1.35 mg/dL Final   Creatinine, Urine  Date Value Ref Range Status  02/19/2020 81 20 - 320 mg/dL Final         Passed - K in normal range and within 180 days    Potassium  Date Value Ref Range Status  11/03/2020 4.6 3.5 - 5.3 mmol/L Final  09/20/2012 4.4 3.5 - 5.1 mmol/L Final         Passed - Patient is not pregnant      Passed - Valid encounter within last 6 months    Recent Outpatient Visits          2 months ago Type 2 diabetes mellitus with hyperglycemia, without long-term current use of insulin [facility])   [facility] Rory Percy M, DO   3 months ago Type 2 diabetes mellitus with hyperglycemia, without long-term current use of insulin [facility])   [facility] Rory Percy M, DO   11 months ago Type 2 diabetes mellitus with hyperglycemia, without long-term current use of insulin [facility])   [facility] Rory Percy M, DO   1 year ago Type 2 diabetes mellitus with hyperglycemia, without long-term current use of insulin [facility])   [facility] Lebron Conners D, MD   1 year ago Type 2 diabetes mellitus with hyperglycemia, without long-term current use of insulin [facility])   [facility] Towanda Malkin, MD      Future Appointments            In 3 weeks Reece Packer, Myna Hidalgo, [facility], [place]   In 3 months  [place]

## 2021-03-05 ENCOUNTER — Ambulatory Visit (INDEPENDENT_AMBULATORY_CARE_PROVIDER_SITE_OTHER): Payer: PPO | Admitting: Nurse Practitioner

## 2021-03-05 ENCOUNTER — Encounter: Payer: Self-pay | Admitting: Nurse Practitioner

## 2021-03-05 ENCOUNTER — Other Ambulatory Visit: Payer: Self-pay

## 2021-03-05 VITALS — BP 138/82 | HR 72 | Temp 97.8°F | Resp 16 | Ht 70.0 in | Wt 236.0 lb

## 2021-03-05 DIAGNOSIS — R030 Elevated blood-pressure reading, without diagnosis of hypertension: Secondary | ICD-10-CM

## 2021-03-05 DIAGNOSIS — E782 Mixed hyperlipidemia: Secondary | ICD-10-CM

## 2021-03-05 DIAGNOSIS — E1165 Type 2 diabetes mellitus with hyperglycemia: Secondary | ICD-10-CM | POA: Diagnosis not present

## 2021-03-05 NOTE — Progress Notes (Signed)
? ?BP 138/82   Pulse 72   Temp 97.8 ?F (36.6 ?C) (Oral)   Resp 16   Ht 5\' 10"  (1.778 m)   Wt 236 lb (107 kg)   SpO2 99%   BMI 33.86 kg/m?   ? ?Subjective:  ? ? Patient ID: John Mandes., male    DOB: 12-May-1950, 71 y.o.   MRN: [id number] ? ?HPI: ?John Jenifer. is a 71 y.o. male ? ?Chief Complaint  ?Patient presents with  ? Diabetes  ? Hypertension  ? Hyperlipidemia  ?  3 month follow up  ? ?DM2: He is not currently on medication. His last A1C was 6.2 on 11/03/20, will get labs today. He denies any polyuria, polydipsia or polyphagia. He says he checks his blood sugar every once in a while and it runs about 113. He is almost due for his eye exam. He is going to call [facility].  Foot exam due and performed today.  ? ?HTN: He says when he checks his blood pressure at home it runs anywhere between 120s/80-150s/87.  In the office today it was 138/82. He does have a prescription for telmisartan but he has not started it yet.  He says he is physically active with walking every day and he is watching his diet.  He denies any chest pain, shortness of breath, headaches or blurred vision.  ? ?Hyperlipidemia: He is not currently taking fish oil. He tries to control his cholesterol with diet.  Last LDL was 135 on 11/03/2020. Will get labs today.  ? ?The 10-year ASCVD risk score (Arnett DK, et al., 2019) is: 34.4% ?  Values used to calculate the score: ?    Age: 72 years ?    Sex: Male ?    Is Non-Hispanic African American: Yes ?    Diabetic: Yes ?    Tobacco smoker: No ?    Systolic Blood Pressure: 774 mmHg ?    Is BP treated: Yes ?    HDL Cholesterol: 59 mg/dL ?    Total Cholesterol: 219 mg/dL  ? ?Relevant past medical, surgical, family and social history reviewed and updated as indicated. Interim medical history since our last visit reviewed. ?Allergies and medications reviewed and updated. ? ?Review of Systems ? ?Constitutional: Negative for fever or weight change.  ?Respiratory: Negative for cough and  shortness of breath.   ?Cardiovascular: Negative for chest pain or palpitations.  ?Gastrointestinal: Negative for abdominal pain, no bowel changes.  ?Musculoskeletal: Negative for gait problem or joint swelling.  ?Skin: Negative for rash.  ?Neurological: Negative for dizziness or headache.  ?No other specific complaints in a complete review of systems (except as listed in HPI above).  ? ?   ?Objective:  ?  ?BP 138/82   Pulse 72   Temp 97.8 ?F (36.6 ?C) (Oral)   Resp 16   Ht 5\' 10"  (1.778 m)   Wt 236 lb (107 kg)   SpO2 99%   BMI 33.86 kg/m?   ?Wt Readings from Last 3 Encounters:  ?03/05/21 236 lb (107 kg)  ?02/05/21 235 lb (106.6 kg)  ?12/02/20 233 lb 12.8 oz (106.1 kg)  ?  ?Physical Exam ? ?Constitutional: Patient appears well-developed and well-nourished. Obese  No distress.  ?HEENT: head atraumatic, normocephalic, pupils equal and reactive to light, neck supple, throat within normal limits ?Cardiovascular: Normal rate, regular rhythm and normal heart sounds.  No murmur heard. No BLE edema. ?Pulmonary/Chest: Effort normal and breath sounds normal. No respiratory distress. ?  Abdominal: Soft.  There is no tenderness. ?Psychiatric: Patient has a normal mood and affect. behavior is normal. Judgment and thought content normal.  ?Diabetic Foot Exam - Simple   ?Simple Foot Form ?Diabetic Foot exam was performed with the following findings: Yes 03/05/2021  8:31 AM  ?Visual Inspection ?No deformities, no ulcerations, no other skin breakdown bilaterally: Yes ?Sensation Testing ?Intact to touch and monofilament testing bilaterally: Yes ?Pulse Check ?Posterior Tibialis and Dorsalis pulse intact bilaterally: Yes ?Comments ?  ?  ?Results for orders placed or performed in visit on 01/30/21  ?PSA  ?Result Value Ref Range  ? Prostate Specific Ag, Serum <0.1 0.0 - 4.0 ng/mL  ? ?   ?Assessment & Plan:  ? ?1. Type 2 diabetes mellitus with hyperglycemia, without long-term current use of insulin ([place]) ?-continue watching diet ?-  COMPLETE METABOLIC PANEL WITH GFR ?- Hemoglobin A1c ?- HM Diabetes Foot Exam ? ?2. Mixed hyperlipidemia ?-continue watching diet ?- Lipid panel ?- COMPLETE METABOLIC PANEL WITH GFR ? ?3. Elevated BP without diagnosis of hypertension ?-continue watching diet ?- CBC with Differential/Platelet ?- COMPLETE METABOLIC PANEL WITH GFR  ? ?Follow up plan: ?Return in about 3 months (around 06/05/2021) for follow up. ? ? ? ? ? ?

## 2021-03-06 ENCOUNTER — Telehealth: Payer: Self-pay | Admitting: *Deleted

## 2021-03-06 LAB — COMPLETE METABOLIC PANEL WITH GFR
AG Ratio: 1.6 (calc) (ref 1.0–2.5)
ALT: 29 U/L (ref 9–46)
AST: 29 U/L (ref 10–35)
Albumin: 4.2 g/dL (ref 3.6–5.1)
Alkaline phosphatase (APISO): 52 U/L (ref 35–144)
BUN: 13 mg/dL (ref 7–25)
CO2: 30 mmol/L (ref 20–32)
Calcium: 9.9 mg/dL (ref 8.6–10.3)
Chloride: 105 mmol/L (ref 98–110)
Creat: 1.19 mg/dL (ref 0.70–1.35)
Globulin: 2.7 g/dL (calc) (ref 1.9–3.7)
Glucose, Bld: 133 mg/dL — ABNORMAL HIGH (ref 65–99)
Potassium: 5.3 mmol/L (ref 3.5–5.3)
Sodium: 140 mmol/L (ref 135–146)
Total Bilirubin: 0.5 mg/dL (ref 0.2–1.2)
Total Protein: 6.9 g/dL (ref 6.1–8.1)
eGFR: 67 mL/min/{1.73_m2} (ref >=60)

## 2021-03-06 LAB — CBC WITH DIFFERENTIAL/PLATELET
Absolute Monocytes: 567 cells/uL (ref 200–950)
Basophils Absolute: 81 cells/uL (ref 0–200)
Basophils Relative: 1.5 %
Eosinophils Absolute: 130 cells/uL (ref 15–500)
Eosinophils Relative: 2.4 %
HCT: 45 % (ref 38.5–50.0)
Hemoglobin: 14.7 g/dL (ref 13.2–17.1)
Lymphs Abs: 1145 cells/uL (ref 850–3900)
MCH: 26.9 pg — ABNORMAL LOW (ref 27.0–33.0)
MCHC: 32.7 g/dL (ref 32.0–36.0)
MCV: 82.3 fL (ref 80.0–100.0)
MPV: 10.4 fL (ref 7.5–12.5)
Monocytes Relative: 10.5 %
Neutro Abs: 3478 cells/uL (ref 1500–7800)
Neutrophils Relative %: 64.4 %
Platelets: 235 10*3/uL (ref 140–400)
RBC: 5.47 10*6/uL (ref 4.20–5.80)
RDW: 12.9 % (ref 11.0–15.0)
Total Lymphocyte: 21.2 %
WBC: 5.4 10*3/uL (ref 3.8–10.8)

## 2021-03-06 LAB — HEMOGLOBIN A1C
Hgb A1c MFr Bld: 7 % of total Hgb — ABNORMAL HIGH (ref <5.7)
Mean Plasma Glucose: 154 mg/dL
eAG (mmol/L): 8.5 mmol/L

## 2021-03-06 LAB — LIPID PANEL
Cholesterol: 232 mg/dL — ABNORMAL HIGH (ref <200)
HDL: 58 mg/dL (ref >=40)
LDL Cholesterol (Calc): 153 mg/dL (calc) — ABNORMAL HIGH
Non-HDL Cholesterol (Calc): 174 mg/dL (calc) — ABNORMAL HIGH (ref <130)
Total CHOL/HDL Ratio: 4 (calc) (ref <5.0)
Triglycerides: 98 mg/dL (ref <150)

## 2021-03-06 NOTE — Telephone Encounter (Signed)
FYI

## 2021-03-06 NOTE — Telephone Encounter (Signed)
John Benson called because he saw his results from the lab. He did not see them on MyChart so he had not seen comments by provider. Reviewed with him. He states he never took the Atorvastatin, he did pick it up but never tried it. I discussed this with him and he states that he will work on diet and exercise and if labs not improved on next draw he will try the Atorvastatin then.  ?

## 2021-03-23 IMAGING — CT CT ABDOMEN AND PELVIS WITH CONTRAST
2 of 5 series · 16 of 46 positions shown, 18 images · IV contrast (APPLIED)
Comparison: Plain film 10/06/2012.  Prior CT 09/20/2012.

CLINICAL DATA: Prostate cancer diagnosed 1 week ago. Asymptomatic.
High risk. Staging.

EXAM:
CT ABDOMEN AND PELVIS WITH CONTRAST
TECHNIQUE: Multidetector CT imaging of the abdomen and pelvis was performed
using the standard protocol following bolus administration of
intravenous contrast.
CONTRAST:  100mL OMNIPAQUE IOHEXOL 300 MG/ML  SOLN

[Series 2: routine abd/pel with · axial · 0.77mm/px · z∈[-528,-63]mm · 13 of 105 slices shown, 15 images]
[im 6/105  soft-tissue]
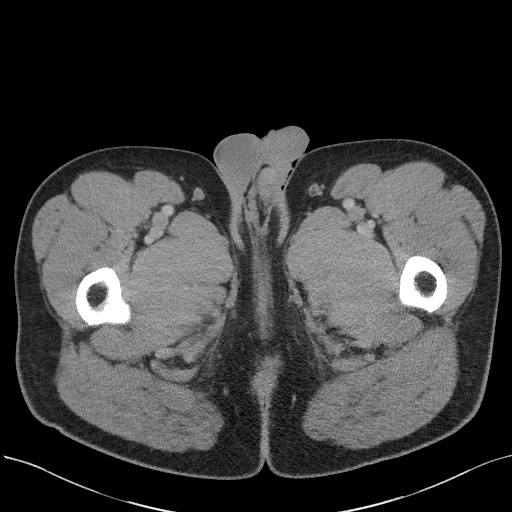
[im 6/105  bone]
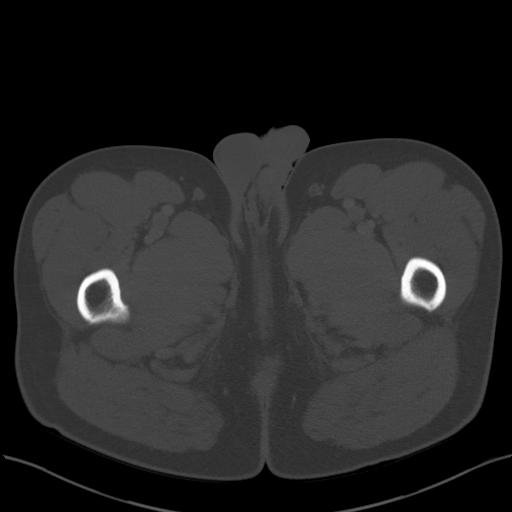
[im 17/105  soft-tissue]
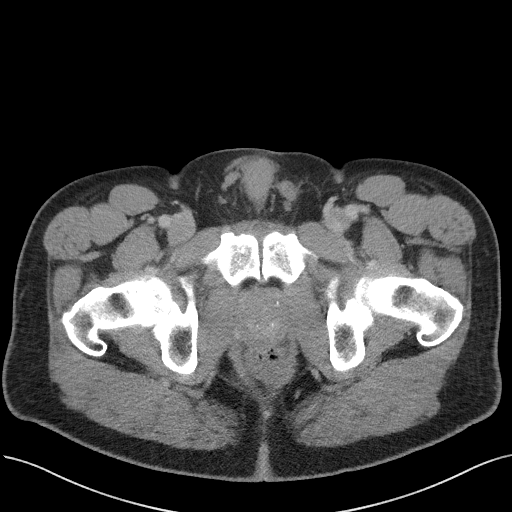
[im 22/105  soft-tissue]
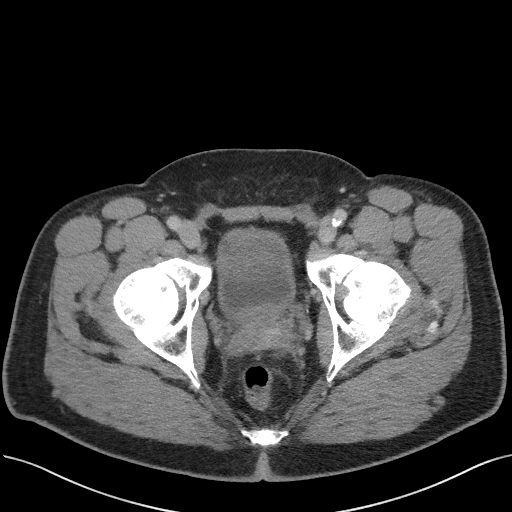
[im 28/105  soft-tissue]
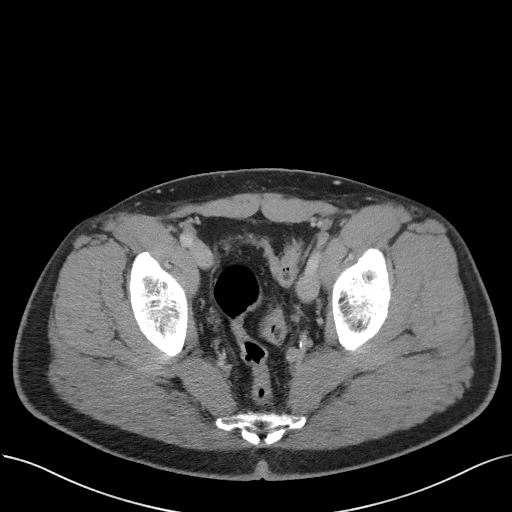
[im 39/105  soft-tissue]
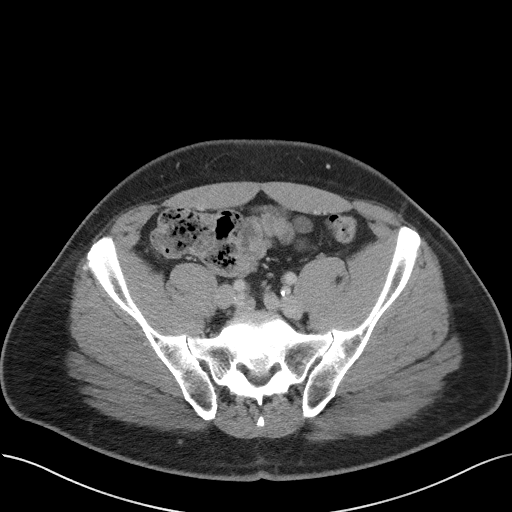
[im 44/105  soft-tissue]
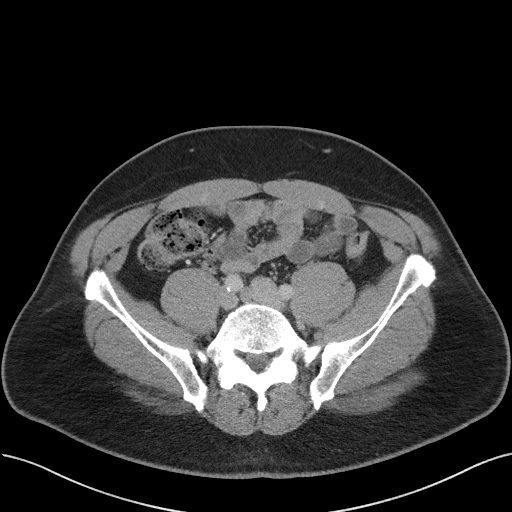
[im 55/105  soft-tissue]
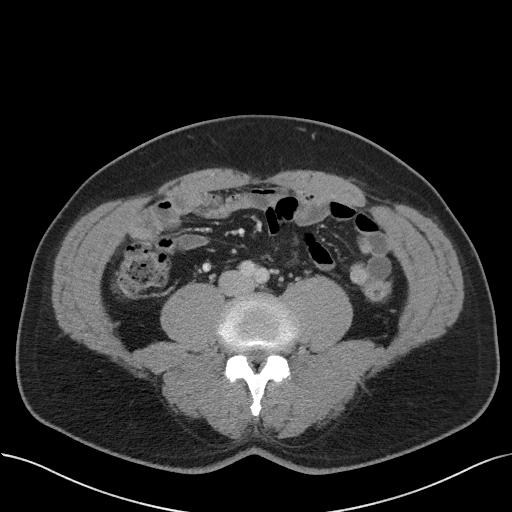
[im 61/105  soft-tissue]
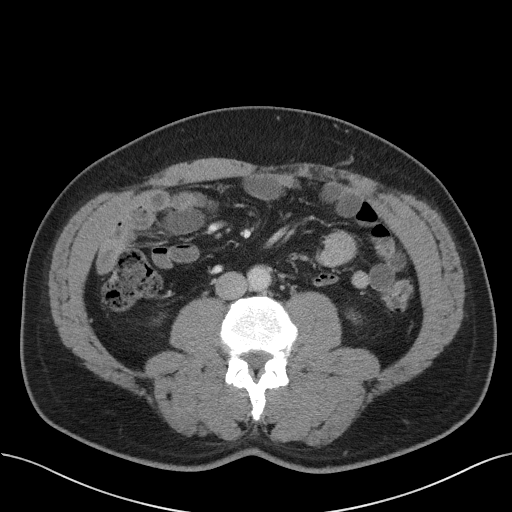
[im 66/105  soft-tissue]
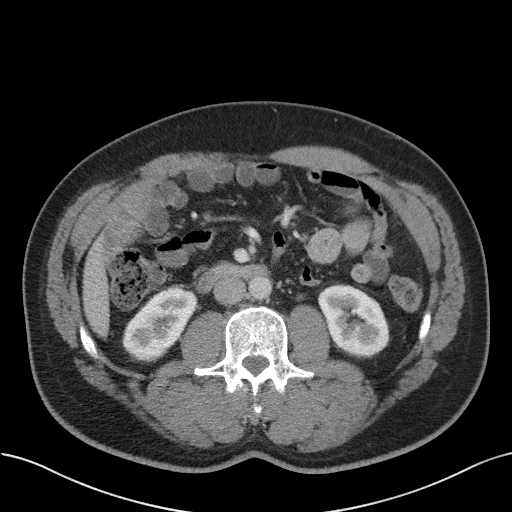
[im 66/105  bone]
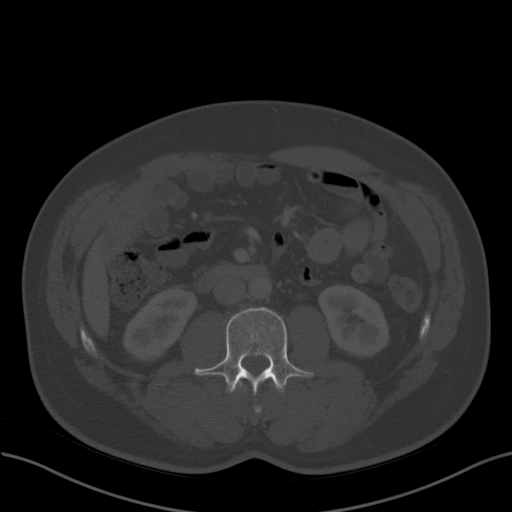
[im 77/105  soft-tissue]
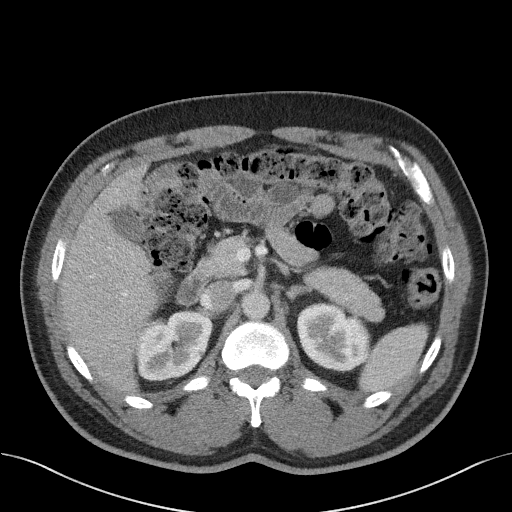
[im 83/105  soft-tissue]
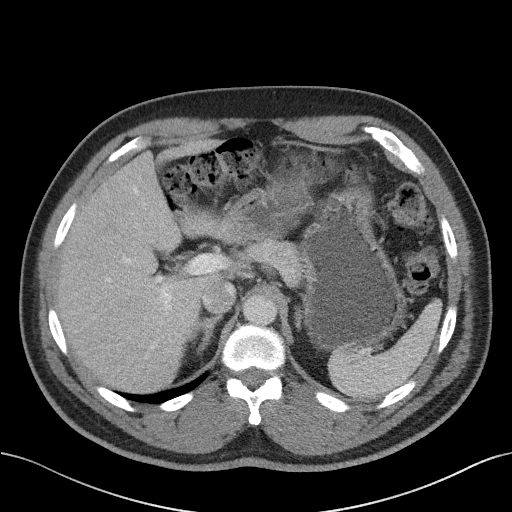
[im 88/105  soft-tissue]
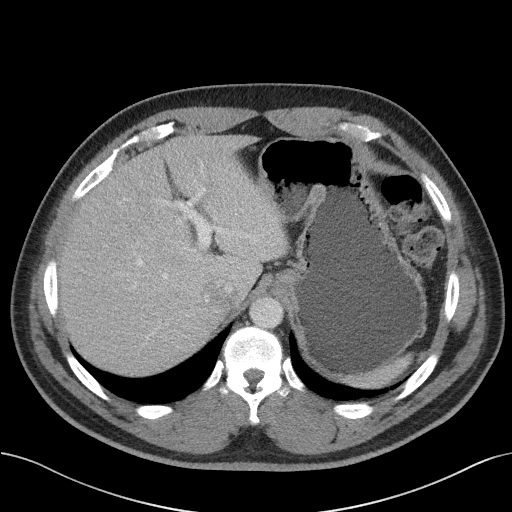
[im 99/105  soft-tissue]
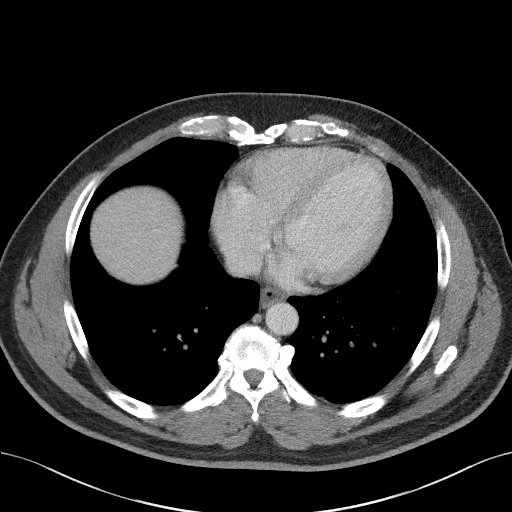

[Series 5: coronal st · coronal · 0.82mm/px · 3 of 100 slices shown]
[im 34/100  soft-tissue]
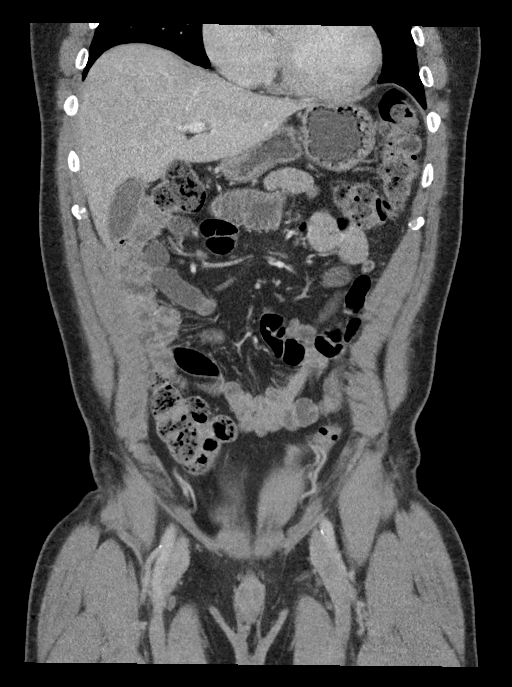
[im 45/100  soft-tissue]
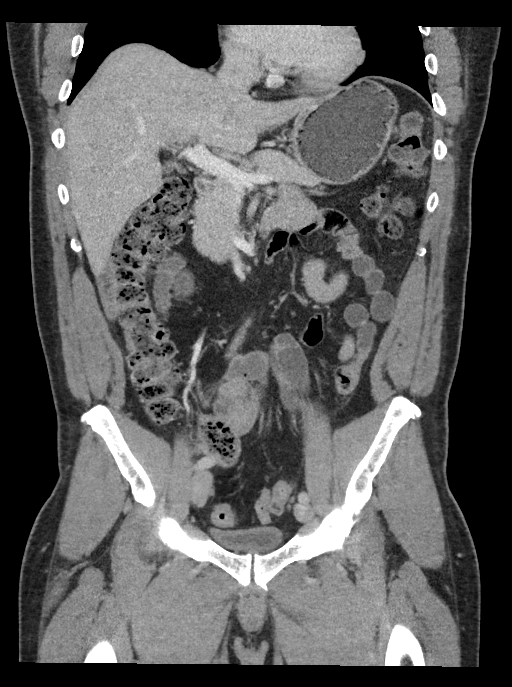
[im 56/100  soft-tissue]
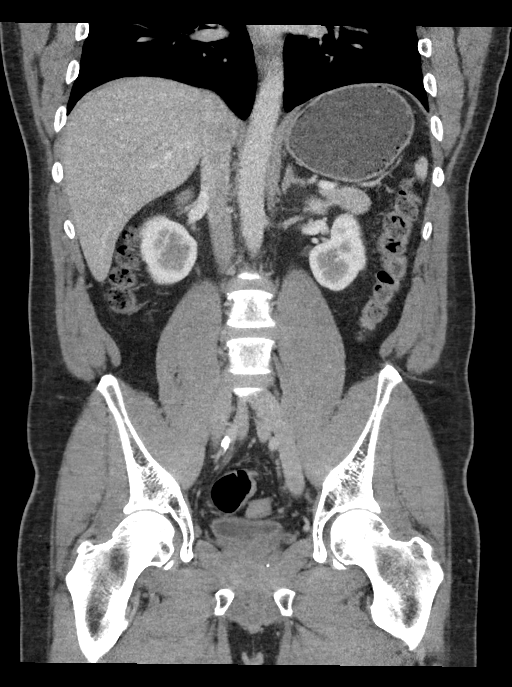

[16 of 46 positions shown; findings below may reference images not displayed]

FINDINGS: Lower chest: Clear lung bases. Normal heart size without pericardial
or pleural effusion.

Hepatobiliary: Normal liver. Normal gallbladder, without biliary
ductal dilatation.

Pancreas: Normal, without mass or ductal dilatation.

Spleen: Normal in size, without focal abnormality.

Adrenals/Urinary Tract: Normal adrenal glands. Interpolar left renal
collecting system 5 mm calculus.

Bilateral too small to characterize renal lesions. Suspect a 4 mm
upper pole right renal calculus.

Anterior lower pole left renal 7 mm lesion demonstrates possible
complexity on image 38/2. This is too small to characterize, but was
felt to be similar in size back on 09/20/2012, favoring a complex
cyst.

Normal urinary bladder.

Stomach/Bowel: Normal stomach, without wall thickening. Normal colon
and terminal ileum. Normal small bowel.

Vascular/Lymphatic: Aortic and branch vessel atherosclerosis. No
abdominopelvic adenopathy.

Reproductive: Symmetric seminal vesicles. Subtle hyperenhancement
involving the left prostatic peripheral zone, base to mid gland.
Example image 87/2. Small left hydrocele.

Other: No significant free fluid.

Musculoskeletal: Tiny left iliac sclerotic lesions are unchanged and
likely bone islands. No suspicious osseous lesion.
IMPRESSION: 1. No acute process or evidence of metastatic disease in the abdomen
or pelvis.
2. Nonspecific left-sided prosthetic hyperenhancement could
represent the site of primary carcinoma.
3. Left and possible right nephrolithiasis.
4.  Aortic Atherosclerosis ([id number]).

## 2021-04-17 ENCOUNTER — Ambulatory Visit: Payer: Self-pay

## 2021-04-17 NOTE — Telephone Encounter (Signed)
?  Chief Complaint: allergies ?Symptoms: nasal congestion and ears clogged  ?Frequency: 1 week  ?Pertinent Negatives: Patient denies any other symptoms, denies ear pain as well ?Disposition: '[]'$ ED /'[]'$ Urgent Care (no appt availability in office) / '[]'$ Appointment(In office/virtual)/ '[]'$  [facility]/ '[x]'$ Home Care/ '[]'$ Refused Recommended Disposition /'[]'$ [facility]/ '[]'$  Follow-up with PCP ?Additional Notes: Pt was advised to use OTC antihistamine and nasal spray to see if they will help with symptoms and if not call back to schedule an appt. Pt verbalized understanding. ? ?Summary: advice - sinus issues / allergies  ? Pt stated he has taken OTC Mucinex but still has sinus issues, pt stated when he goes outside the pollen seems to affect him and his sinuses, pt needed advice on what else he can take.   ?  ? ? ?Reason for Disposition ? [1] Nasal allergies AND [6] only certain times of year (hay fever) ? ?Answer Assessment - Initial Assessment Questions ?1. SYMPTOM: "What's the main symptom you're concerned about?" (e.g., runny nose, stuffiness, sneezing, itching) ?    Nasal congestion ?3. EYES: "Are the eyes also red, watery, and itchy?"  ?    No ?4. TRIGGER: "What pollen or other allergic substance do you think is causing the symptoms?"  ?    Pollen  ?5. TREATMENT: "What medicine are you using?" "What medicine worked best in the past?" ?    Mucinex ?6. OTHER SYMPTOMS: "Do you have any other symptoms?" (e.g., coughing, difficulty breathing, wheezing) ?    No ? ?Protocols used: Nasal Allergies (Hay Fever)-A-AH ? ?

## 2021-04-21 ENCOUNTER — Encounter: Payer: Self-pay | Admitting: Internal Medicine

## 2021-04-21 ENCOUNTER — Other Ambulatory Visit: Payer: Self-pay

## 2021-04-21 ENCOUNTER — Telehealth (INDEPENDENT_AMBULATORY_CARE_PROVIDER_SITE_OTHER): Payer: PPO | Admitting: Internal Medicine

## 2021-04-21 DIAGNOSIS — J302 Other seasonal allergic rhinitis: Secondary | ICD-10-CM | POA: Diagnosis not present

## 2021-04-21 MED ORDER — CETIRIZINE HCL 10 MG PO TABS: 10.0000 mg | ORAL_TABLET | Freq: Every day | ORAL | 2 refills | Status: DC

## 2021-04-21 NOTE — Patient Instructions (Signed)
It was great seeing you today! ? ?Plan discussed at today's visit: ?-Zyrtec sent to pharmacy, take at night but if it makes you too drowsy can try Allegra or Claritin over the counter ?-Come by the office and pick up nasal spray sample - has nasal steroid and anti-histamine spray for allergies in one spray ?-Can also try over the counter Zadiator eye drops for allergic eye symptoms  ? ?Follow up in: as needed ? ?Take care and let us know if you have any questions or concerns prior to your next visit. ? ?Dr. Rosana Berger ? ?

## 2021-04-21 NOTE — Progress Notes (Signed)
Virtual Visit via Video Note ? ?I connected with John Benson. on 04/21/21 at  8:00 AM EDT by a video enabled telemedicine application and verified that I am speaking with the correct person using two identifiers. ? ?Location: ?Patient: Home ?Provider: [facility] ?  ?I discussed the limitations of evaluation and management by telemedicine and the availability of in person appointments. The patient expressed understanding and agreed to proceed. ? ?History of Present Illness: ? ?John Benson is a 71 year old male presenting via telemedicine for productive cough. Symptoms started about 2 weeks. Nasal congestion, sneezing, cough is productive with clear/white mucus, thick. Started out mild but is progressing.  ? ?-Fever: no ?-Cough: yes productive  ?-Shortness of breath:  with exertion  ?-Wheezing: yes ?-Chest pain: no ?-Chest congestion: no ?-Nasal congestion: yes ?-Runny nose: yes, clear thick  ?-Post nasal drip: no ?-Sneezing: yes ?-Sore throat: no ?-Sinus pressure: no ?-Headache: no ?-Ear pain: no    ?-Ear pressure: yes bilateral ?-Eyes red/itching:yes ?-Eye drainage/crusting: no  ?-Context: worse ?-Relief with OTC cold/cough medications: no  ?-Treatments attempted: mucinex, stopped. Using Flonase.  ? ?  ?Observations/Objective: ? ?General: well appearing, no acute distress ?ENT: conjunctiva normal appearing bilaterally, voice sightly hoarse ?Skin: no rashes, cyanosis or abnormal bruising noted ?Neuro: answers all questions appropriately  ? ? ?Assessment and Plan: ? ?1. Seasonal allergies: Given sample of Ryaltris combination nasal spray to try, treat with oral anti-histamines and follow up if symptoms worsen or fail to improve.  ? ?- cetirizine (ZYRTEC) 10 MG tablet; Take 1 tablet (10 mg total) by mouth daily.  Dispense: 30 tablet; Refill: 2 ? ? ?Follow Up Instructions: PRN ? ?  ?I discussed the assessment and treatment plan with the patient. The patient was provided an opportunity to ask questions and all were  answered. The patient agreed with the plan and demonstrated an understanding of the instructions. ?  ?The patient was advised to call back or seek an in-person evaluation if the symptoms worsen or if the condition fails to improve as anticipated. ? ?I provided 14 minutes of non-face-to-face time during this encounter. ? ? ?Teodora Medici, DO ? ?

## 2021-04-30 LAB — HM DIABETES EYE EXAM

## 2021-06-04 ENCOUNTER — Ambulatory Visit (INDEPENDENT_AMBULATORY_CARE_PROVIDER_SITE_OTHER): Payer: PPO

## 2021-06-04 DIAGNOSIS — Z0000 Encounter for general adult medical examination without abnormal findings: Secondary | ICD-10-CM

## 2021-06-04 NOTE — Progress Notes (Signed)
Subjective:   John Benson. is a 71 y.o. male who presents for Medicare Annual/Subsequent preventive examination.  Virtual Visit via Telephone Note  I connected with  John Benson. on 06/04/21 at  8:15 AM EDT by telephone and verified that I am speaking with the correct person using two identifiers.  Location: Patient: home Provider: [place] Persons participating in the virtual visit: [name]   I discussed the limitations, risks, security and privacy concerns of performing an evaluation and management service by telephone and the availability of in person appointments. The patient expressed understanding and agreed to proceed.  Interactive audio and video telecommunications were attempted between this nurse and patient, however failed, due to patient having technical difficulties OR patient did not have access to video capability.  We continued and completed visit with audio only.  Some vital signs may be absent or patient reported.   John Marker, LPN   Review of Systems     Cardiac Risk Factors include: advanced age (>11mn, >>50women);diabetes mellitus;hypertension;male gender;dyslipidemia     Objective:    There were no vitals filed for this visit. There is no height or weight on file to calculate BMI.     06/04/2021    8:22 AM 05/29/2020    8:48 AM 05/29/2019    8:59 AM 05/03/2019   12:45 PM 01/26/2019    9:20 AM 08/18/2018    3:00 PM 08/18/2018    6:19 AM  Advanced Directives  Does Patient Have a Medical Advance Directive? No No No No No No No  Would patient like information on creating a medical advance directive? Yes (MAU/Ambulatory/Procedural Areas - Information given) Yes (MAU/Ambulatory/Procedural Areas - Information given) Yes (MAU/Ambulatory/Procedural Areas - Information given) No - Patient declined No - Patient declined No - Patient declined No - Patient declined    Current Medications (verified) Outpatient Encounter Medications as  of 06/04/2021  Medication Sig   BAYER ASPIRIN EC LOW DOSE 81 MG EC tablet Take 81 mg by mouth daily.    COLACE 100 MG capsule Take 100 mg by mouth daily.    Multiple Vitamin (MULTIVITAMIN) LIQD Take 10 mLs by mouth daily.   Omega-3 Fatty Acids (OMEGA-3 FISH OIL PO) Take 1 capsule by mouth daily.   cetirizine (ZYRTEC) 10 MG tablet Take 1 tablet (10 mg total) by mouth daily. (Patient not taking: Reported on 06/04/2021)   sildenafil (VIAGRA) 100 MG tablet Take 1 tablet (100 mg total) by mouth daily as needed for erectile dysfunction. (Patient not taking: Reported on 06/04/2021)   telmisartan (MICARDIS) 20 MG tablet TAKE 1 TABLET BY MOUTH EVERY DAY (Patient not taking: Reported on 06/04/2021)   No facility-administered encounter medications on file as of 06/04/2021.    Allergies (verified) Testosterone   History: Past Medical History:  Diagnosis Date   Acute kidney failure ([facility] 08/20/2018   Benign essential HTN    Calculus of kidney    Cancer ([name])    Carpal tunnel syndrome    Carpal tunnel syndrome 07/15/2014   Chronic kidney disease, stage II (mild)    Diabetes [place]    controlled by diet and exercise   History of kidney stones    HTN (hypertension)    maintained by diet and exercise   Hyperlipidemia    Hypogonadism in male    Obesity    Prostate cancer ([facility]    Past Surgical History:  Procedure Laterality Date   COLONOSCOPY  LITHOTRIPSY     PELVIC LYMPH NODE DISSECTION Bilateral 08/18/2018   Procedure: PELVIC LYMPH NODE DISSECTION;  Surgeon: Billey Co, MD;  Location: [facility] ORS;  Service: Urology;  Laterality: Bilateral;   ROBOT ASSISTED LAPAROSCOPIC RADICAL PROSTATECTOMY N/A 08/18/2018   Procedure: XI ROBOTIC ASSISTED LAPAROSCOPIC RADICAL PROSTATECTOMY;  Surgeon: Billey Co, MD;  Location: [facility] ORS;  Service: Urology;  Laterality: N/A;   Family History  Problem Relation Age of Onset   Diabetes Mother    Cancer Mother        breast    Vision loss Mother    Hypertension Father    Dementia Father    Diabetes Brother    Early death Brother    Pancreatic disease Brother    Early death Sister    Kidney disease Sister    Kidney cancer Neg Hx    Prostate cancer Neg Hx    Social History   Socioeconomic History   Marital status: Married    Spouse name: Not on file   Number of children: Not on file   Years of education: Not on file   Highest education level: Not on file  Occupational History   Not on file  Tobacco Use   Smoking status: Never   Smokeless tobacco: Never  Vaping Use   Vaping Use: Never used  Substance and Sexual Activity   Alcohol use: No   Drug use: No   Sexual activity: Yes  Other Topics Concern   Not on file  Social History Narrative   Not on file   Social Determinants of Health   Financial Resource Strain: Low Risk    Difficulty of Paying Living Expenses: Not hard at all  Food Insecurity: No Food Insecurity   Worried About Charity fundraiser in the Last Year: Never true   [place] in the Last Year: Never true  Transportation Needs: No Transportation Needs   Lack of Transportation (Medical): No   Lack of Transportation (Non-Medical): No  Physical Activity: Sufficiently Active   Days of Exercise per Week: 6 days   Minutes of Exercise per Session: 60 min  Stress: No Stress Concern Present   Feeling of Stress : Not at all  Social Connections: Moderately Integrated   Frequency of Communication with Friends and Family: More than three times a week   Frequency of Social Gatherings with Friends and Family: Three times a week   Attends Religious Services: More than 4 times per year   Active Member of Clubs or Organizations: No   Attends Archivist Meetings: Never   Marital Status: Married    Tobacco Counseling Counseling given: Not Answered   Clinical Intake:  Pre-visit preparation completed: Yes  Pain : No/denies pain     Nutritional Risks: None Diabetes:  Yes CBG done?: No Did pt. bring in CBG monitor from home?: No  How often do you need to have someone help you when you read instructions, pamphlets, or other written materials from your doctor or pharmacy?: 1 - Never  Nutrition Risk Assessment:  Has the patient had any N/V/D within the last 2 months?  No  Does the patient have any non-healing wounds?  No  Has the patient had any unintentional weight loss or weight gain?  No   Diabetes:  Is the patient diabetic?  Yes  If diabetic, was a CBG obtained today?  No  Did the patient bring in their glucometer from home?  No  How often  do you monitor your CBG's? occasionally.   Financial Strains and Diabetes Management:  Are you having any financial strains with the device, your supplies or your medication? No .  Does the patient want to be seen by Chronic Care Management for management of their diabetes?  No  Would the patient like to be referred to a Nutritionist or for Diabetic Management?  No   Diabetic Exams:  Diabetic Eye Exam: Completed 04/30/21.   Diabetic Foot Exam: Completed 03/05/21.    Interpreter Needed?: No  Information entered by :: John Marker LPN   Activities of Daily Living    06/04/2021    8:22 AM 04/21/2021    7:48 AM  In your present state of health, do you have any difficulty performing the following activities:  Hearing? 0 0  Vision? 0 0  Difficulty concentrating or making decisions? 0 0  Walking or climbing stairs? 0 0  Dressing or bathing? 0 0  Doing errands, shopping? 0 0  Preparing Food and eating ? N   Using the Toilet? N   In the past six months, have you accidently leaked urine? N   Do you have problems with loss of bowel control? N   Managing your Medications? N   Managing your Finances? N   Housekeeping or managing your Housekeeping? N     Patient Care Team: Bo Merino, FNP as PCP - General (Nurse Practitioner) Noreene Filbert, MD as Radiation Oncologist (Radiation Oncology) Billey Co, MD as Consulting Physician (Urology)  Indicate any recent Medical Services you may have received from other than [facility] providers in the past year (date may be approximate).     Assessment:   This is a routine wellness examination for John Benson.  Hearing/Vision screen Hearing Screening - Comments:: Pt denies hearing difficulty Vision Screening - Comments:: Annual vision screenings done at [employer] issues and exercise activities discussed: Current Exercise Habits: Home exercise routine, Type of exercise: treadmill, Time (Minutes): 60, Frequency (Times/Week): 6, Weekly Exercise (Minutes/Week): 360, Intensity: Moderate, Exercise limited by: None identified   Goals Addressed   None    Depression Screen    06/04/2021    8:21 AM 04/21/2021    7:48 AM 03/05/2021    8:08 AM 12/02/2020    8:10 AM 11/03/2020    8:17 AM 05/29/2020    8:48 AM 02/19/2020    8:09 AM  PHQ 2/9 Scores  PHQ - 2 Score 0 0 0 0 0 0 0  PHQ- 9 Score    0 0      Fall Risk    06/04/2021    8:22 AM 04/21/2021    7:48 AM 03/05/2021    8:08 AM 12/02/2020    8:10 AM 11/03/2020    8:17 AM  Fall Risk   Falls in the past year? 0 0 0 0 0  Number falls in past yr: 0 0 0 0 0  Injury with Fall? 0 0 0 0 0  Risk for fall due to : No Fall Risks    No Fall Risks  Follow up Falls prevention discussed Falls evaluation completed Falls evaluation completed  Falls prevention discussed    FALL RISK PREVENTION PERTAINING TO THE HOME:  Any stairs in or around the home? No  If so, are there any without handrails? No  Home free of loose throw rugs in walkways, pet beds, electrical cords, etc? Yes  Adequate lighting in your home to reduce risk of falls? Yes  ASSISTIVE DEVICES UTILIZED TO PREVENT FALLS:  Life alert? No  Use of a cane, walker or w/c? No  Grab bars in the bathroom? Yes  Shower chair or bench in shower? No  Elevated toilet seat or a handicapped toilet? Yes   TIMED UP AND GO:  Was the test  performed? No . Telephonic visit.      Cognitive Function: Normal cognitive status assessed by direct observation by this Nurse Health Advisor. No abnormalities found.          05/25/2018    1:40 PM  6CIT Screen  What Year? 0 points  What month? 0 points  What time? 0 points  Count back from 20 0 points  Months in reverse 0 points  Repeat phrase 2 points  Total Score 2 points    Immunizations Immunization History  Administered Date(s) Administered   Influenza, High Dose Seasonal PF 10/08/2018   Influenza, Seasonal, Injecte, Preservative Fre 10/12/2013   Influenza,inj,Quad PF,6+ Mos 10/26/2012, 09/30/2015, 10/22/2016, 09/29/2017   Influenza-Unspecified 09/25/2014, 09/29/2017, 09/29/2019, 09/20/2020   Moderna Covid-19 Vaccine Bivalent Booster 25yr & up 05/23/2021   Moderna Sars-Covid-2 Vaccination 01/11/2019, 02/08/2019, 10/27/2019, 04/12/2020   PNEUMOCOCCAL CONJUGATE-20 05/23/2021   Pneumococcal Conjugate-13 04/30/2013   Pneumococcal Polysaccharide-23 07/21/2010, 02/19/2020   Tdap 07/21/2010    TDAP status: Due, Education has been provided regarding the importance of this vaccine. Advised may receive this vaccine at local pharmacy or Health Dept. Aware to provide a copy of the vaccination record if obtained from local pharmacy or Health Dept. Verbalized acceptance and understanding.  Flu Vaccine status: Up to date  Pneumococcal vaccine status: Up to date  Covid-19 vaccine status: Completed vaccines  Qualifies for Shingles Vaccine? Yes   Zostavax completed No   Shingrix Completed?: No.    Education has been provided regarding the importance of this vaccine. Patient has been advised to call insurance company to determine out of pocket expense if they have not yet received this vaccine. Advised may also receive vaccine at local pharmacy or Health Dept. Verbalized acceptance and understanding.  Screening Tests Health Maintenance  Topic Date Due   Zoster Vaccines-  Shingrix (1 of 2) 06/05/2021 (Originally 12/26/1969)   TETANUS/TDAP  03/06/2022 (Originally 07/20/2020)   INFLUENZA VACCINE  08/04/2021   HEMOGLOBIN A1C  09/05/2021   FOOT EXAM  03/06/2022   OPHTHALMOLOGY EXAM  05/01/2022   COLONOSCOPY (Pts [id number] coverage will need to be confirmed)  12/26/2023   Pneumonia Vaccine 65103Years old  Completed   COVID-19 Vaccine  Completed   Hepatitis C Screening  Completed   HPV VACCINES  Aged Out    Health Maintenance  There are no preventive care reminders to display for this patient.   Colorectal cancer screening: Type of screening: Colonoscopy. Completed 12/25/13. Repeat every 10 years  Lung Cancer Screening: (Low Dose CT Chest recommended if Age 69-80ears, 30 pack-year currently smoking OR have quit w/in 15years.) does not qualify.   Additional Screening:  Hepatitis C Screening: does qualify; Completed 06/22/11  Vision Screening: Recommended annual ophthalmology exams for early detection of glaucoma and other disorders of the eye. Is the patient up to date with their annual eye exam?  Yes  Who is the provider or what is the name of the [facility] which the patient [facility]  Dental Screening: Recommended annual dental exams for proper oral hygiene  Community Resource Referral / Chronic Care Management: CRR required this visit?  No  CCM required this visit?  No      Plan:     I have personally reviewed and noted the following in the patient's chart:   Medical and social history Use of alcohol, tobacco or illicit drugs  Current medications and supplements including opioid prescriptions. Patient is not currently taking opioid prescriptions. Functional ability and status Nutritional status Physical activity Advanced directives List of other physicians Hospitalizations, surgeries, and ER visits in previous 12 months Vitals Screenings to include cognitive, depression, and  falls Referrals and appointments  In addition, I have reviewed and discussed with patient certain preventive protocols, quality metrics, and best practice recommendations. A written personalized care plan for preventive services as well as general preventive health recommendations were provided to patient.     John Marker, LPN   0/0/7121   Nurse Notes: none

## 2021-06-04 NOTE — Patient Instructions (Signed)
[name] , Thank you for taking time to come for your Medicare Wellness Visit. I appreciate your ongoing commitment to your health goals. Please review the following plan we discussed and let me know if I can assist you in the future.   Screening recommendations/referrals: Colonoscopy: done 12/25/13. Repeat 12/2023 Recommended yearly ophthalmology/optometry visit for glaucoma screening and checkup Recommended yearly dental visit for hygiene and checkup  Vaccinations: Influenza vaccine: done 09/20/20 Pneumococcal vaccine: done 05/23/21 Tdap vaccine: due Shingles vaccine: Shingrix discussed. Please contact your pharmacy for coverage information.  Covid-19: done 01/11/19, 02/08/19, 10/27/19, 04/12/20 & 05/23/21  Advanced directives: Advance directive discussed with you today. I have provided a copy for you to complete at home and have notarized. Once this is complete please bring a copy in to our office so we can scan it into your chart.   Conditions/risks identified: Keep up the great work!  Next appointment: Follow up in one year for your annual wellness visit.   Preventive Care 42 Years and Older, Male Preventive care refers to lifestyle choices and visits with your health care provider that can promote health and wellness. What does preventive care include? A yearly physical exam. This is also called an annual well check. Dental exams once or twice a year. Routine eye exams. Ask your health care provider how often you should have your eyes checked. Personal lifestyle choices, including: Daily care of your teeth and gums. Regular physical activity. Eating a healthy diet. Avoiding tobacco and drug use. Limiting alcohol use. Practicing safe sex. Taking low doses of aspirin every day. Taking vitamin and mineral supplements as recommended by your health care provider. What happens during an annual well check? The services and screenings done by your health care provider during your annual  well check will depend on your age, overall health, lifestyle risk factors, and family history of disease. Counseling  Your health care provider may ask you questions about your: Alcohol use. Tobacco use. Drug use. Emotional well-being. Home and relationship well-being. Sexual activity. Eating habits. History of falls. Memory and ability to understand (cognition). Work and work Statistician. Screening  You may have the following tests or measurements: Height, weight, and BMI. Blood pressure. Lipid and cholesterol levels. These may be checked every 5 years, or more frequently if you are over 69 years old. Skin check. Lung cancer screening. You may have this screening every year starting at age 69 if you have a 30-pack-year history of smoking and currently smoke or have quit within the past 15 years. Fecal occult blood test (FOBT) of the stool. You may have this test every year starting at age 69. Flexible sigmoidoscopy or colonoscopy. You may have a sigmoidoscopy every 5 years or a colonoscopy every 10 years starting at age 69. Prostate cancer screening. Recommendations will vary depending on your family history and other risks. Hepatitis C blood test. Hepatitis B blood test. Sexually transmitted disease (STD) testing. Diabetes screening. This is done by checking your blood sugar (glucose) after you have not eaten for a while (fasting). You may have this done every 1-3 years. Abdominal aortic aneurysm (AAA) screening. You may need this if you are a current or former smoker. Osteoporosis. You may be screened starting at age 69 if you are at high risk. Talk with your health care provider about your test results, treatment options, and if necessary, the need for more tests. Vaccines  Your health care provider may recommend certain vaccines, such as: Influenza vaccine. This is recommended every  year. Tetanus, diphtheria, and acellular pertussis (Tdap, Td) vaccine. You may need a Td booster  every 10 years. Zoster vaccine. You may need this after age 73. Pneumococcal 13-valent conjugate (PCV13) vaccine. One dose is recommended after age 27. Pneumococcal polysaccharide (PPSV23) vaccine. One dose is recommended after age 43. Talk to your health care provider about which screenings and vaccines you need and how often you need them. This information is not intended to replace advice given to you by your health care provider. Make sure you discuss any questions you have with your health care provider. Document Released: 01/17/2015 Document Revised: 09/10/2015 Document Reviewed: 10/22/2014 Elsevier Interactive Patient Education  2017 [employer] Prevention in the Home Falls can cause injuries. They can happen to people of all ages. There are many things you can do to make your home safe and to help prevent falls. What can I do on the outside of my home? Regularly fix the edges of walkways and driveways and fix any cracks. Remove anything that might make you trip as you walk through a door, such as a raised step or threshold. Trim any bushes or trees on the path to your home. Use bright outdoor lighting. Clear any walking paths of anything that might make someone trip, such as rocks or tools. Regularly check to see if handrails are loose or broken. Make sure that both sides of any steps have handrails. Any raised decks and porches should have guardrails on the edges. Have any leaves, snow, or ice cleared regularly. Use sand or salt on walking paths during winter. Clean up any spills in your garage right away. This includes oil or grease spills. What can I do in the bathroom? Use night lights. Install grab bars by the toilet and in the tub and shower. Do not use towel bars as grab bars. Use non-skid mats or decals in the tub or shower. If you need to sit down in the shower, use a plastic, non-slip stool. Keep the floor dry. Clean up any water that spills on the floor as soon  as it happens. Remove soap buildup in the tub or shower regularly. Attach bath mats securely with double-sided non-slip rug tape. Do not have throw rugs and other things on the floor that can make you trip. What can I do in the bedroom? Use night lights. Make sure that you have a light by your bed that is easy to reach. Do not use any sheets or blankets that are too big for your bed. They should not hang down onto the floor. Have a firm chair that has side arms. You can use this for support while you get dressed. Do not have throw rugs and other things on the floor that can make you trip. What can I do in the kitchen? Clean up any spills right away. Avoid walking on wet floors. Keep items that you use a lot in easy-to-reach places. If you need to reach something above you, use a strong step stool that has a grab bar. Keep electrical cords out of the way. Do not use floor polish or wax that makes floors slippery. If you must use wax, use non-skid floor wax. Do not have throw rugs and other things on the floor that can make you trip. What can I do with my stairs? Do not leave any items on the stairs. Make sure that there are handrails on both sides of the stairs and use them. Fix handrails that are broken or  loose. Make sure that handrails are as long as the stairways. Check any carpeting to make sure that it is firmly attached to the stairs. Fix any carpet that is loose or worn. Avoid having throw rugs at the top or bottom of the stairs. If you do have throw rugs, attach them to the floor with carpet tape. Make sure that you have a light switch at the top of the stairs and the bottom of the stairs. If you do not have them, ask someone to add them for you. What else can I do to help prevent falls? Wear shoes that: Do not have high heels. Have rubber bottoms. Are comfortable and fit you well. Are closed at the toe. Do not wear sandals. If you use a stepladder: Make sure that it is fully  opened. Do not climb a closed stepladder. Make sure that both sides of the stepladder are locked into place. Ask someone to hold it for you, if possible. Clearly mark and make sure that you can see: Any grab bars or handrails. First and last steps. Where the edge of each step is. Use tools that help you move around (mobility aids) if they are needed. These include: Canes. Walkers. Scooters. Crutches. Turn on the lights when you go into a dark area. Replace any light bulbs as soon as they burn out. Set up your furniture so you have a clear path. Avoid moving your furniture around. If any of your floors are uneven, fix them. If there are any pets around you, be aware of where they are. Review your medicines with your doctor. Some medicines can make you feel dizzy. This can increase your chance of falling. Ask your doctor what other things that you can do to help prevent falls. This information is not intended to replace advice given to you by your health care provider. Make sure you discuss any questions you have with your health care provider. Document Released: 10/17/2008 Document Revised: 05/29/2015 Document Reviewed: 01/25/2014 Elsevier Interactive Patient Education  2017 [employer].

## 2021-06-11 ENCOUNTER — Encounter: Payer: Self-pay | Admitting: Nurse Practitioner

## 2021-06-11 ENCOUNTER — Ambulatory Visit (INDEPENDENT_AMBULATORY_CARE_PROVIDER_SITE_OTHER): Payer: PPO | Admitting: Nurse Practitioner

## 2021-06-11 VITALS — BP 148/87 | HR 73 | Temp 97.6°F | Resp 16 | Ht 69.5 in | Wt 235.5 lb

## 2021-06-11 DIAGNOSIS — I10 Essential (primary) hypertension: Secondary | ICD-10-CM

## 2021-06-11 DIAGNOSIS — E1165 Type 2 diabetes mellitus with hyperglycemia: Secondary | ICD-10-CM

## 2021-06-11 DIAGNOSIS — Z23 Encounter for immunization: Secondary | ICD-10-CM

## 2021-06-11 DIAGNOSIS — E782 Mixed hyperlipidemia: Secondary | ICD-10-CM | POA: Diagnosis not present

## 2021-06-11 LAB — POCT GLYCOSYLATED HEMOGLOBIN (HGB A1C): Hemoglobin A1C: 7.3 % — AB (ref 4.0–5.6)

## 2021-06-11 NOTE — Progress Notes (Signed)
BP (!) 148/87   Pulse 73   Temp 97.6 F (36.4 C) (Oral)   Resp 16   Ht 5' 9.5" (1.765 m)   Wt 235 lb 8 oz (106.8 kg)   SpO2 99%   BMI 34.28 kg/m    Subjective:    Patient ID: John Mandes., male    DOB: 1950/08/22, 71 y.o.   MRN: [id number]  HPI: John Benson is a 71 y.o. male  Chief Complaint  Patient presents with   Follow-up   Hyperlipidemia   Hypertension   Diabetes   DM2: Patient is not currently taking any medication for his diabetes.  He does not routinely check his blood sugar he did say he checked it earlier this week and it was elevated but he does not remember what number.  Patient denies any polyuria, polydipsia or polyphasia.  His A1c on 11/03/2020 was 6.2 but then jumped up to 7.0 on 03/05/2021.  His A1c today is 7.3.  He really does not want to take any medication.  He has been watching his diet.  Says he walks about 6 miles every morning.  He is up-to-date on his foot exam and eye exam.  He is due for microalbumin we will get that today.  Discussed with patient that if his A1c has not improved at his next appointment we will need to start medication.   HTN: Patient reports that his blood pressure has been running 130-140s/60-70s at home.  On arrival to the office his blood pressure was 152/84 recheck was 148/87.  Patient states he had a very stressful morning.  Patient also reported to me that he has not been taking his blood pressure medication.  Discussed with patient the risks associated with hypertension.  Patient denies any chest pain, shortness of breath, headaches or blurred vision.  Patient states he will consider taking his blood pressure medication.   Hyperlipidemia: He is not currently taking any medication for his cholesterol.  Discussed with him that his last LDL was 153.  Discussed that his risk for heart attack or stroke over the next 10 years has increased.  Patient states he is going to work on getting his blood pressure down and then he will  consider taking cholesterol medication.  The 10-year ASCVD risk score (Arnett DK, et al., 2019) is: 38.9%   Values used to calculate the score:     Age: 73 years     Sex: Male     Is Non-Hispanic African American: Yes     Diabetic: Yes     Tobacco smoker: No     Systolic Blood Pressure: 702 mmHg     Is BP treated: Yes     HDL Cholesterol: 58 mg/dL     Total Cholesterol: 232 mg/dL   Relevant past medical, surgical, family and social history reviewed and updated as indicated. Interim medical history since our last visit reviewed. Allergies and medications reviewed and updated.  Review of Systems  Constitutional: Negative for fever or weight change.  Respiratory: Negative for cough and shortness of breath.   Cardiovascular: Negative for chest pain or palpitations.  Gastrointestinal: Negative for abdominal pain, no bowel changes.  Musculoskeletal: Negative for gait problem or joint swelling.  Skin: Negative for rash.  Neurological: Negative for dizziness or headache.  No other specific complaints in a complete review of systems (except as listed in HPI above).      Objective:    BP (!) 148/87   Pulse  73   Temp 97.6 F (36.4 C) (Oral)   Resp 16   Ht 5' 9.5" (1.765 m)   Wt 235 lb 8 oz (106.8 kg)   SpO2 99%   BMI 34.28 kg/m   Wt Readings from Last 3 Encounters:  06/11/21 235 lb 8 oz (106.8 kg)  03/05/21 236 lb (107 kg)  02/05/21 235 lb (106.6 kg)    Physical Exam  Constitutional: Patient appears well-developed and well-nourished. Obese  No distress.  HEENT: head atraumatic, normocephalic, pupils equal and reactive to light,  neck supple Cardiovascular: Normal rate, regular rhythm and normal heart sounds.  No murmur heard. No BLE edema. Pulmonary/Chest: Effort normal and breath sounds normal. No respiratory distress. Abdominal: Soft.  There is no tenderness. Psychiatric: Patient has a normal mood and affect. behavior is normal. Judgment and thought content normal.   Results for orders placed or performed in visit on 06/11/21  POCT HgB A1C  Result Value Ref Range   Hemoglobin A1C 7.3 (A) 4.0 - 5.6 %   HbA1c POC (<> result, manual entry)     HbA1c, POC (prediabetic range)     HbA1c, POC (controlled diabetic range)        Assessment & Plan:   Problem List Items Addressed This Visit       Cardiovascular and Mediastinum   Essential hypertension    Initial blood pressure was 152/84 recheck was 148/87 patient is supposed to be taking telmisartan 20 mg daily.  Patient states he has not been taking his blood pressure medicine he says his blood pressures been running about 130s to 140s over 60s to 70s at home.  Patient reports he had a stressful morning.  Discussed risks associated with uncontrolled hypertension.  Patient is going to  consider taking his blood pressure medicine.        Endocrine   Type 2 diabetes mellitus with hyperglycemia, without long-term current use of insulin ([place]) - Primary    Patient is not currently on any medications for his diabetes.  His A1c today is 7.3.  Patient reports he would like to work on his diet a little more.  Discussed with patient that if his A1c has not improved at his next appointment we will need to start medication.      Relevant Orders   POCT HgB A1C (Completed)   Microalbumin / creatinine urine ratio     Other   Mixed hyperlipidemia    Patient is resistant to starting cholesterol medication.  His last LDL was 153 on 03/05/2021.  Patient states he is going to work on his blood pressure and his diet.       Other Visit Diagnoses     Need for vaccination       Relevant Orders   Varicella-zoster vaccine IM (Shingrix) (Completed)        Follow up plan: Return in about 3 months (around 09/11/2021) for follow up.

## 2021-06-11 NOTE — Assessment & Plan Note (Signed)
Patient is resistant to starting cholesterol medication.  His last LDL was 153 on 03/05/2021.  Patient states he is going to work on his blood pressure and his diet.

## 2021-06-11 NOTE — Assessment & Plan Note (Signed)
Initial blood pressure was 152/84 recheck was 148/87 patient is supposed to be taking telmisartan 20 mg daily.  Patient states he has not been taking his blood pressure medicine he says his blood pressures been running about 130s to 140s over 60s to 70s at home.  Patient reports he had a stressful morning.  Discussed risks associated with uncontrolled hypertension.  Patient is going to  consider taking his blood pressure medicine.

## 2021-06-11 NOTE — Assessment & Plan Note (Signed)
Patient is not currently on any medications for his diabetes.  His A1c today is 7.3.  Patient reports he would like to work on his diet a little more.  Discussed with patient that if his A1c has not improved at his next appointment we will need to start medication.

## 2021-06-12 LAB — MICROALBUMIN / CREATININE URINE RATIO
Creatinine, Urine: 123 mg/dL (ref 20–320)
Microalb Creat Ratio: 19 mcg/mg creat (ref <30)
Microalb, Ur: 2.3 mg/dL

## 2021-07-29 ENCOUNTER — Ambulatory Visit
Admission: RE | Admit: 2021-07-29 | Discharge: 2021-07-29 | Disposition: A | Discharge status: Home / Self Care | Payer: PPO | Source: Ambulatory Visit | Attending: Radiation Oncology | Admitting: Radiation Oncology

## 2021-07-29 ENCOUNTER — Encounter: Payer: Self-pay | Admitting: Radiation Oncology

## 2021-07-29 ENCOUNTER — Other Ambulatory Visit: Payer: Self-pay | Admitting: *Deleted

## 2021-07-29 VITALS — BP 159/89 | HR 53 | Resp 18 | Ht 69.5 in | Wt 223.0 lb

## 2021-07-29 DIAGNOSIS — Z923 Personal history of irradiation: Secondary | ICD-10-CM | POA: Insufficient documentation

## 2021-07-29 DIAGNOSIS — C61 Malignant neoplasm of prostate: Secondary | ICD-10-CM | POA: Diagnosis present

## 2021-07-29 NOTE — Progress Notes (Signed)
Radiation Oncology Follow up Note  Name: John Benson.   Date:   07/29/2021 MRN:  [id number] DOB: Jan 24, 1950    This 71 y.o. male presents to the clinic today for 2-1/2-year follow-up status post salvage radiation therapy to his prostatic fossa inpatient status post robotic assisted prostatectomy for Gleason 9 (4+5) adenocarcinoma.  REFERRING PROVIDER: [facility]  HPI: Patient is a 71 year old male now out 2-1/2 years having completed salvage radiation therapy status post robotic assisted prostatectomy for Gleason 9 adenocarcinoma seen today in routine follow-up he is doing well.  He specifically denies any increased lower urinary tract symptoms diarrhea or fatigue.  His most recent PSA continues to be less than 0.1.  His next PSA will be at the end of July..  COMPLICATIONS OF TREATMENT: none  FOLLOW UP COMPLIANCE: keeps appointments   PHYSICAL EXAM:  BP (!) 159/89 (BP Location: Left Arm, Patient Position: Sitting)   Pulse (!) 53   Resp 18   Ht 5' 9.5" (1.765 m)   Wt 223 lb (101.2 kg)   BMI 32.46 kg/m  Well-developed well-nourished patient in NAD. HEENT reveals PERLA, EOMI, discs not visualized.  Oral cavity is clear. No oral mucosal lesions are identified. Neck is clear without evidence of cervical or supraclavicular adenopathy. Lungs are clear to A&P. Cardiac examination is essentially unremarkable with regular rate and rhythm without murmur rub or thrill. Abdomen is benign with no organomegaly or masses noted. Motor sensory and DTR levels are equal and symmetric in the upper and lower extremities. Cranial nerves II through XII are grossly intact. Proprioception is intact. No peripheral adenopathy or edema is identified. No motor or sensory levels are noted. Crude visual fields are within normal range.     RADIOLOGY RESULTS: No current films for review  PLAN: Present time patient is doing well under excellent biochemical control of his prostate cancer have another  PSA this month I have asked to see him back in 1 year for follow-up.  Patient knows to call with any concerns.  I would like to take this opportunity to thank you for allowing me to participate in the care of your patient.Noreene Filbert, MD

## 2021-07-31 ENCOUNTER — Other Ambulatory Visit: Payer: PPO

## 2021-08-06 ENCOUNTER — Ambulatory Visit: Payer: PPO | Admitting: Urology

## 2021-08-25 ENCOUNTER — Other Ambulatory Visit: Payer: PPO

## 2021-08-25 DIAGNOSIS — C61 Malignant neoplasm of prostate: Secondary | ICD-10-CM

## 2021-08-26 LAB — PSA: Prostate Specific Ag, Serum: <0.1 ng/mL (ref 0.0–4.0)

## 2021-08-27 ENCOUNTER — Encounter: Payer: Self-pay | Admitting: Urology

## 2021-08-27 ENCOUNTER — Ambulatory Visit: Payer: PPO | Admitting: Urology

## 2021-08-27 VITALS — BP 138/75 | HR 56 | Ht 69.5 in | Wt 213.0 lb

## 2021-08-27 DIAGNOSIS — N529 Male erectile dysfunction, unspecified: Secondary | ICD-10-CM | POA: Diagnosis not present

## 2021-08-27 DIAGNOSIS — C61 Malignant neoplasm of prostate: Secondary | ICD-10-CM

## 2021-08-27 DIAGNOSIS — Z8546 Personal history of malignant neoplasm of prostate: Secondary | ICD-10-CM | POA: Diagnosis not present

## 2021-08-27 MED ORDER — SILDENAFIL CITRATE 100 MG PO TABS: 100.0000 mg | ORAL_TABLET | Freq: Every day | ORAL | 6 refills | Status: DC | PRN

## 2021-08-27 NOTE — Progress Notes (Signed)
   08/27/2021 9:33 AM   John Benson. [birth year] [id number]  Reason for visit: Follow up high risk prostate cancer, ED, stress incontinence  HPI: He is a 71 year old African-American male who had a long history of significantly elevated PSA before following up with urology and undergoing a prostate biopsy on 05/18/2018 for an elevated PSA of 20.  This showed high risk prostate cancer, and staging imaging showed no evidence of metastatic disease.He then underwent a robotic prostatectomy and bilateral lymphadenectomy on 08/18/2018 with final pathology showing no lymph node involvement, however Gleason score 4+5=9 stage pT3a prostate adenocarcinoma with extraprostatic extension, lymphovascular invasion, perineural invasion, and positive margins at the left apex, and bilaterally.  Carcinoma was present within the vascular bundles outside the muscular layer of the right SV.   His initial post-op PSA in October 2020 was undetectable, however PSA in January 2021 was 0.2, and confirmed at 0.2 on repeat later that week.  He underwent salvage radiation with Dr. Baruch Gouty that was completed in March 2021.    Post radiation PSA was undetectable, and remains undetectable on most recent PSA dated 08/25/2021.   At baseline prior to surgery he had moderate to severe ED that was only minimally improved with PDE5 inhibitors.  He has previously tried Cialis postoperatively with no improvement in ED, as well as penile injections with [name], PA.  He had significant pain with the penile injections and is not interested in pursuing this further.   He has had some improvement with the erections on the 100 mg sildenafil as needed, and this was refilled today.   He is doing well from a urination perspective.  He has mild leakage, and wears a pad with physical activity.  We discussed trial of a Cunningham clamp if he has worsening of his stress incontinence.    We discussed the risk of recurrence with his high  risk disease, and the need for close PSA monitoring.  Per the guideline recommendations can space to yearly PSA at this point.   Sildenafil 100 mg as needed refilled RTC 1 year PSA prior   Billey Co, MD  [facility] [address], [place], [place] [address] [phone]

## 2021-09-11 ENCOUNTER — Ambulatory Visit (INDEPENDENT_AMBULATORY_CARE_PROVIDER_SITE_OTHER): Payer: PPO | Admitting: Nurse Practitioner

## 2021-09-11 ENCOUNTER — Encounter: Payer: Self-pay | Admitting: Nurse Practitioner

## 2021-09-11 ENCOUNTER — Other Ambulatory Visit: Payer: Self-pay

## 2021-09-11 VITALS — BP 128/72 | HR 62 | Temp 97.7°F | Resp 16 | Ht 69.5 in | Wt 213.8 lb

## 2021-09-11 DIAGNOSIS — E1165 Type 2 diabetes mellitus with hyperglycemia: Secondary | ICD-10-CM | POA: Diagnosis not present

## 2021-09-11 DIAGNOSIS — I10 Essential (primary) hypertension: Secondary | ICD-10-CM

## 2021-09-11 DIAGNOSIS — Z23 Encounter for immunization: Secondary | ICD-10-CM

## 2021-09-11 DIAGNOSIS — C61 Malignant neoplasm of prostate: Secondary | ICD-10-CM

## 2021-09-11 DIAGNOSIS — E782 Mixed hyperlipidemia: Secondary | ICD-10-CM

## 2021-09-11 NOTE — Progress Notes (Signed)
BP 128/72   Pulse 62   Temp 97.7 F (36.5 C) (Oral)   Resp 16   Ht 5' 9.5" (1.765 m)   Wt 213 lb 12.8 oz (97 kg)   SpO2 98%   BMI 31.12 kg/m    Subjective:    Patient ID: John Benson., male    DOB: 05/02/1950, 71 y.o.   MRN: [id number]  HPI: John Benson is a 71 y.o. male  Chief Complaint  Patient presents with   Diabetes   Hyperlipidemia   Hypertension   DM2: His A1C at last visit was 7.3 on 06/11/2021.  He is not currently on medication.  We did discuss at his last appointment that if his A1c continues to increase or does not improve he will need to start medication.  Patient denies any polyuria, polydipsia or polyphasia.  Patient has really tried watching his diet and he says he walks about 6 miles every morning.  He is up-to-date on his foot exam, eye exam and microalbumin.  HTN: Patient reports that his blood pressures been running he says it has been running 140s.  His blood pressure today is 128/72.  Patient denies any chest pain, shortness of breath, headaches or blurred vision.  At last appointment patient had said that he had not been taking his blood pressure medication.  He did say he would consider taking it.  He is supposed to take telmisartan 20 mg daily. He says he is not taking his blood pressure medication. He says he has been really watching his diet and exercising.   Hyperlipidemia: Is not currently taking medications at this time.  He used to be on a statin.  Patient's last LDL was 153 on 03/05/2021.  At his last appointment we discussed that his risk for heart attack or stroke over the next 10 years has increased.  Patient stated he was going to work on getting his blood pressure down and then he would consider taking cholesterol medication.  The 10-year ASCVD risk score (Arnett DK, et al., 2019) is: 19.9%   Values used to calculate the score:     Age: 70 years     Sex: Male     Is Non-Hispanic African American: Yes     Diabetic: Yes     Tobacco  smoker: No     Systolic Blood Pressure: 578 mmHg     Is BP treated: No     HDL Cholesterol: 58 mg/dL     Total Cholesterol: 232 mg/dL   Prostate cancer: He last saw John Benson, urology on 08/27/2021.  Patient is currently doing yearly PSAs.  His last PSA was less than 0.1 on 08/25/2021.  He underwent a robotic prostate date ectomy and bilateral lymphadenectomy on 08/18/2018 with final pathology showing no lymph node involvement.  Patient reports he is doing well at this time.  Relevant past medical, surgical, family and social history reviewed and updated as indicated. Interim medical history since our last visit reviewed. Allergies and medications reviewed and updated.  Review of Systems  Constitutional: Negative for fever or weight change.  Respiratory: Negative for cough and shortness of breath.   Cardiovascular: Negative for chest pain or palpitations.  Gastrointestinal: Negative for abdominal pain, no bowel changes.  Musculoskeletal: Negative for gait problem or joint swelling.  Skin: Negative for rash.  Neurological: Negative for dizziness or headache.  No other specific complaints in a complete review of systems (except as listed in HPI above).  Objective:    BP 128/72   Pulse 62   Temp 97.7 F (36.5 C) (Oral)   Resp 16   Ht 5' 9.5" (1.765 m)   Wt 213 lb 12.8 oz (97 kg)   SpO2 98%   BMI 31.12 kg/m   Wt Readings from Last 3 Encounters:  09/11/21 213 lb 12.8 oz (97 kg)  08/27/21 213 lb (96.6 kg)  07/29/21 223 lb (101.2 kg)    Physical Exam  Constitutional: Patient appears well-developed and well-nourished. Obese  No distress.  HEENT: head atraumatic, normocephalic, pupils equal and reactive to light,  neck supple Cardiovascular: Normal rate, regular rhythm and normal heart sounds.  No murmur heard. No BLE edema. Pulmonary/Chest: Effort normal and breath sounds normal. No respiratory distress. Abdominal: Soft.  There is no tenderness. Psychiatric: Patient has a  normal mood and affect. behavior is normal. Judgment and thought content normal.  Results for orders placed or performed in visit on 08/25/21  PSA  Result Value Ref Range   Prostate Specific Ag, Serum <0.1 0.0 - 4.0 ng/mL      Assessment & Plan:   Problem List Items Addressed This Visit       Cardiovascular and Mediastinum   Essential hypertension    Pressure at goal 128/72.  Patient currently not on medications.  Patient has been working on lifestyle modification including diet and exercise.      Relevant Orders   CBC with Differential/Platelet   COMPLETE METABOLIC PANEL WITH GFR     Endocrine   Type 2 diabetes mellitus with hyperglycemia, without long-term current use of insulin ([place]) - Primary    She is not currently on medication.  We will get A1c today.  Patient is working on lifestyle modification including diet and exercise.      Relevant Orders   Hemoglobin A1c     Genitourinary   Prostate cancer ([place])    Condition stable.  Last saw John Benson urology 08/27/2021 his last PSA was less than 0.1.        Other   Mixed hyperlipidemia   Relevant Orders   Lipid panel   Other Visit Diagnoses     Need for influenza vaccination       Relevant Orders   Flu Vaccine QUAD High Dose(Fluad) (Completed)   Need for shingles vaccine       Relevant Orders   Zoster Recombinant (Shingrix ) (Completed)        Follow up plan: Return in about 4 months (around 01/11/2022).

## 2021-09-11 NOTE — Assessment & Plan Note (Signed)
She is not currently on medication.  We will get A1c today.  Patient is working on lifestyle modification including diet and exercise.

## 2021-09-11 NOTE — Assessment & Plan Note (Signed)
Pressure at goal 128/72.  Patient currently not on medications.  Patient has been working on lifestyle modification including diet and exercise.

## 2021-09-11 NOTE — Assessment & Plan Note (Signed)
Condition stable.  Last saw Dr. Diamantina [facility] urology 08/27/2021 his last PSA was less than 0.1.

## 2021-09-12 LAB — COMPLETE METABOLIC PANEL WITH GFR
AG Ratio: 1.6 (calc) (ref 1.0–2.5)
ALT: 24 U/L (ref 9–46)
AST: 26 U/L (ref 10–35)
Albumin: 4.4 g/dL (ref 3.6–5.1)
Alkaline phosphatase (APISO): 49 U/L (ref 35–144)
BUN/Creatinine Ratio: 16 (calc) (ref 6–22)
BUN: 23 mg/dL (ref 7–25)
CO2: 25 mmol/L (ref 20–32)
Calcium: 9.9 mg/dL (ref 8.6–10.3)
Chloride: 104 mmol/L (ref 98–110)
Creat: 1.42 mg/dL — ABNORMAL HIGH (ref 0.70–1.35)
Globulin: 2.8 g/dL (calc) (ref 1.9–3.7)
Glucose, Bld: 120 mg/dL — ABNORMAL HIGH (ref 65–99)
Potassium: 5.5 mmol/L — ABNORMAL HIGH (ref 3.5–5.3)
Sodium: 138 mmol/L (ref 135–146)
Total Bilirubin: 0.4 mg/dL (ref 0.2–1.2)
Total Protein: 7.2 g/dL (ref 6.1–8.1)
eGFR: 54 mL/min/{1.73_m2} — ABNORMAL LOW (ref >=60)

## 2021-09-12 LAB — CBC WITH DIFFERENTIAL/PLATELET
Absolute Monocytes: 524 cells/uL (ref 200–950)
Basophils Absolute: 70 cells/uL (ref 0–200)
Basophils Relative: 1.3 %
Eosinophils Absolute: 70 cells/uL (ref 15–500)
Eosinophils Relative: 1.3 %
HCT: 44.4 % (ref 38.5–50.0)
Hemoglobin: 14.4 g/dL (ref 13.2–17.1)
Lymphs Abs: 1042 cells/uL (ref 850–3900)
MCH: 26.5 pg — ABNORMAL LOW (ref 27.0–33.0)
MCHC: 32.4 g/dL (ref 32.0–36.0)
MCV: 81.6 fL (ref 80.0–100.0)
MPV: 9.7 fL (ref 7.5–12.5)
Monocytes Relative: 9.7 %
Neutro Abs: 3694 cells/uL (ref 1500–7800)
Neutrophils Relative %: 68.4 %
Platelets: 254 10*3/uL (ref 140–400)
RBC: 5.44 10*6/uL (ref 4.20–5.80)
RDW: 13.4 % (ref 11.0–15.0)
Total Lymphocyte: 19.3 %
WBC: 5.4 10*3/uL (ref 3.8–10.8)

## 2021-09-12 LAB — HEMOGLOBIN A1C
Hgb A1c MFr Bld: 6.3 % of total Hgb — ABNORMAL HIGH (ref <5.7)
Mean Plasma Glucose: 134 mg/dL
eAG (mmol/L): 7.4 mmol/L

## 2021-09-12 LAB — LIPID PANEL
Cholesterol: 220 mg/dL — ABNORMAL HIGH (ref <200)
HDL: 56 mg/dL (ref >=40)
LDL Cholesterol (Calc): 146 mg/dL (calc) — ABNORMAL HIGH
Non-HDL Cholesterol (Calc): 164 mg/dL (calc) — ABNORMAL HIGH (ref <130)
Total CHOL/HDL Ratio: 3.9 (calc) (ref <5.0)
Triglycerides: 81 mg/dL (ref <150)

## 2022-01-11 ENCOUNTER — Encounter: Payer: Self-pay | Admitting: Nurse Practitioner

## 2022-01-11 ENCOUNTER — Ambulatory Visit (INDEPENDENT_AMBULATORY_CARE_PROVIDER_SITE_OTHER): Payer: PPO | Admitting: Nurse Practitioner

## 2022-01-11 ENCOUNTER — Other Ambulatory Visit: Payer: Self-pay

## 2022-01-11 VITALS — BP 120/78 | HR 68 | Temp 97.9°F | Resp 16 | Ht 69.5 in | Wt 209.5 lb

## 2022-01-11 DIAGNOSIS — I10 Essential (primary) hypertension: Secondary | ICD-10-CM

## 2022-01-11 DIAGNOSIS — E1165 Type 2 diabetes mellitus with hyperglycemia: Secondary | ICD-10-CM

## 2022-01-11 DIAGNOSIS — E782 Mixed hyperlipidemia: Secondary | ICD-10-CM | POA: Diagnosis not present

## 2022-01-11 DIAGNOSIS — C61 Malignant neoplasm of prostate: Secondary | ICD-10-CM | POA: Diagnosis not present

## 2022-01-11 LAB — POCT GLYCOSYLATED HEMOGLOBIN (HGB A1C): Hemoglobin A1C: 6.4 % — AB (ref 4.0–5.6)

## 2022-01-11 NOTE — Assessment & Plan Note (Signed)
A1C was 6.4  today, continue working on diet and exercise.

## 2022-01-11 NOTE — Assessment & Plan Note (Signed)
Continue working on life style modification, recommend starting a statin, patient is resistant at this time.

## 2022-01-11 NOTE — Assessment & Plan Note (Signed)
Doing well, no changes

## 2022-01-11 NOTE — Assessment & Plan Note (Signed)
Blood pressure great today 120/78, continue life style modification.

## 2022-01-11 NOTE — Progress Notes (Signed)
BP 120/78   Pulse 68   Temp 97.9 F (36.6 C) (Oral)   Resp 16   Ht 5' 9.5" (1.765 m)   Wt 209 lb 8 oz (95 kg)   SpO2 99%   BMI 30.49 kg/m    Subjective:    Patient ID: John Mandes., male    DOB: 1950-08-15, 72 y.o.   MRN: [id number]  HPI: John Benson is a 72 y.o. male  Chief Complaint  Patient presents with   Diabetes   Hyperlipidemia   Hypertension    4 month follow up   DM2: Last A1C was 6.3 on 09/11/2021.  He denies any polydipsia, polyphagia or polyuria.  Patient is working on lifestyle modification.  He is not currently on medications. He is up to date on his foot, eye exam and microalbumin. Today his A1C is 6.4.   UXN:ATFTDDU blood pressure today is 120/78.  Patient is prescribed telmisartan 20 mg daily.  Last few appointments he has not been taking his blood pressure medication.  Patient was encouraged to do so. Today patient reports he is not taking his medication, however he has been watching his diet and working really hard on it.  He denies any chest pain, shortness of breath, headaches or blurred vision.    Hyperlipidemia: His last LDL was 146 on 09/11/2021.  He is not currently on medications, he was previously on a statin.  Last visit we discussed that his ASCVD risk score had increased.  Patient was going to continue working on lifes style modification because he did not want to take medications. He has previously been on a statin before.  Discussed recommendations of starting statin for cholesterol. Patient wants to continue working on lifestyle modification.  Will recheck labs next appointment.   The 10-year ASCVD risk score (Arnett DK, et al., 2019) is: 18.4%   Values used to calculate the score:     Age: 89 years     Sex: Male     Is Non-Hispanic African American: Yes     Diabetic: Yes     Tobacco smoker: No     Systolic Blood Pressure: 202 mmHg     Is BP treated: No     HDL Cholesterol: 56 mg/dL     Total Cholesterol: 220 mg/dL   Prostate  cancer:patient does yearly PSAs. His last PSA was <0.01 on 08/25/2021. He last saw Dr. Diamantina [facility], urology o 08/27/2021. He underwent a robotic prostate date ectomy and bilateral lymphadenectomy on 08/18/2018 with final pathology showing no lymph node involvement.  Patient reports he continues to do well.   Relevant past medical, surgical, family and social history reviewed and updated as indicated. Interim medical history since our last visit reviewed. Allergies and medications reviewed and updated.  Review of Systems  Constitutional: Negative for fever or weight change.  Respiratory: Negative for cough and shortness of breath.   Cardiovascular: Negative for chest pain or palpitations.  Gastrointestinal: Negative for abdominal pain, no bowel changes.  Musculoskeletal: Negative for gait problem or joint swelling.  Skin: Negative for rash.  Neurological: Negative for dizziness or headache.  No other specific complaints in a complete review of systems (except as listed in HPI above).      Objective:    BP 120/78   Pulse 68   Temp 97.9 F (36.6 C) (Oral)   Resp 16   Ht 5' 9.5" (1.765 m)   Wt 209 lb 8 oz (95 kg)   SpO2  99%   BMI 30.49 kg/m   Wt Readings from Last 3 Encounters:  01/11/22 209 lb 8 oz (95 kg)  09/11/21 213 lb 12.8 oz (97 kg)  08/27/21 213 lb (96.6 kg)    Physical Exam  Constitutional: Patient appears well-developed and well-nourished. Obese  No distress.  HEENT: head atraumatic, normocephalic, pupils equal and reactive to light,  neck supple Cardiovascular: Normal rate, regular rhythm and normal heart sounds.  No murmur heard. No BLE edema. Pulmonary/Chest: Effort normal and breath sounds normal. No respiratory distress. Abdominal: Soft.  There is no tenderness. Psychiatric: Patient has a normal mood and affect. behavior is normal. Judgment and thought content normal.   Results for orders placed or performed in visit on 01/11/22  POCT HgB A1C  Result Value Ref Range    Hemoglobin A1C 6.4 (A) 4.0 - 5.6 %   HbA1c POC (<> result, manual entry)     HbA1c, POC (prediabetic range)     HbA1c, POC (controlled diabetic range)        Assessment & Plan:   Problem List Items Addressed This Visit       Cardiovascular and Mediastinum   Essential hypertension    Blood pressure great today 120/78, continue life style modification.        Endocrine   Type 2 diabetes mellitus with hyperglycemia, without long-term current use of insulin ([name]) - Primary    A1C was 6.4  today, continue working on diet and exercise.       Relevant Orders   POCT HgB A1C (Completed)     Genitourinary   Prostate cancer ([place])    Doing well, no changes        Other   Mixed hyperlipidemia    Continue working on life style modification, recommend starting a statin, patient is resistant at this time.         Follow up plan: Return in about 4 months (around 05/12/2022) for follow up.

## 2022-03-26 LAB — HM DIABETES EYE EXAM

## 2022-03-30 ENCOUNTER — Encounter: Payer: Self-pay | Admitting: Nurse Practitioner

## 2022-05-03 ENCOUNTER — Telehealth: Payer: Self-pay | Admitting: Nurse Practitioner

## 2022-05-03 NOTE — Telephone Encounter (Signed)
Contacted John Benson. to schedule their annual wellness visit. Appointment made for 05/27/2022.  [facility] Care Guide [facility] AWV TEAM Direct Dial: [phone]

## 2022-05-12 NOTE — Progress Notes (Unsigned)
There were no vitals taken for this visit.   Subjective:    Patient ID: John Blood., male    DOB: [birth year], 72 y.o.   MRN: [id number]  HPI: John Kwolek. is a 72 y.o. male  No chief complaint on file.  DM2: His last A1C was 6.4 on 01/11/2022. He has been working on lifestyle modification and is not currently on medication for his diabetes. He denies any polydipsia, polyuria or polyphagia.    HTN:his blood pressure today is ***. He does not like taking medication and is working on lifestyle modification.  Patient denies any shortness of breath, headaches, chest pain or blurred vision.   Hyperlipidemia: he is not currently on medications. He has been working on lifestyle modification.  He had previously been prescribed a statin but he does not like to take medication.  His ASCVD risk score was increased at last appointment and discussed this with him.  He does not want to take medications. His last LDL was 146 on 09/11/2021. Will check labs today.   The 10-year ASCVD risk score (Arnett DK, et al., 2019) is: 18.4%   Values used to calculate the score:     Age: 16 years     Sex: Male     Is Non-Hispanic African American: Yes     Diabetic: Yes     Tobacco smoker: No     Systolic Blood Pressure: 120 mmHg     Is BP treated: No     HDL Cholesterol: 56 mg/dL     Total Cholesterol: 220 mg/dL   Prostate cancer:patient does yearly PSAs. His last PSA was <0.01 on 08/25/2021. He last saw Dr. Richardo Hanks, urology o 08/27/2021. He underwent a robotic prostate date ectomy and bilateral lymphadenectomy on 08/18/2018 with final pathology showing no lymph node involvement.  Patient reports he continues to do well. No changes.   Relevant past medical, surgical, family and social history reviewed and updated as indicated. Interim medical history since our last visit reviewed. Allergies and medications reviewed and updated.  Review of Systems  Constitutional: Negative for fever or weight change.   Respiratory: Negative for cough and shortness of breath.   Cardiovascular: Negative for chest pain or palpitations.  Gastrointestinal: Negative for abdominal pain, no bowel changes.  Musculoskeletal: Negative for gait problem or joint swelling.  Skin: Negative for rash.  Neurological: Negative for dizziness or headache.  No other specific complaints in a complete review of systems (except as listed in HPI above).      Objective:    There were no vitals taken for this visit.  Wt Readings from Last 3 Encounters:  01/11/22 209 lb 8 oz (95 kg)  09/11/21 213 lb 12.8 oz (97 kg)  08/27/21 213 lb (96.6 kg)    Physical Exam  Constitutional: Patient appears well-developed and well-nourished. Obese  No distress.  HEENT: head atraumatic, normocephalic, pupils equal and reactive to light,  neck supple Cardiovascular: Normal rate, regular rhythm and normal heart sounds.  No murmur heard. No BLE edema. Pulmonary/Chest: Effort normal and breath sounds normal. No respiratory distress. Abdominal: Soft.  There is no tenderness. Psychiatric: Patient has a normal mood and affect. behavior is normal. Judgment and thought content normal.   Results for orders placed or performed in visit on 03/30/22  HM DIABETES EYE EXAM  Result Value Ref Range   HM Diabetic Eye Exam No Retinopathy No Retinopathy      Assessment & Plan:   Problem List  Items Addressed This Visit   None    Follow up plan: No follow-ups on file.

## 2022-05-13 ENCOUNTER — Encounter: Payer: Self-pay | Admitting: Nurse Practitioner

## 2022-05-13 ENCOUNTER — Ambulatory Visit (INDEPENDENT_AMBULATORY_CARE_PROVIDER_SITE_OTHER): Payer: PPO | Admitting: Nurse Practitioner

## 2022-05-13 ENCOUNTER — Other Ambulatory Visit: Payer: Self-pay

## 2022-05-13 VITALS — BP 118/76 | HR 69 | Temp 97.8°F | Resp 16 | Ht 69.5 in | Wt 205.7 lb

## 2022-05-13 DIAGNOSIS — N1831 Chronic kidney disease, stage 3a: Secondary | ICD-10-CM

## 2022-05-13 DIAGNOSIS — C61 Malignant neoplasm of prostate: Secondary | ICD-10-CM | POA: Diagnosis not present

## 2022-05-13 DIAGNOSIS — I10 Essential (primary) hypertension: Secondary | ICD-10-CM

## 2022-05-13 DIAGNOSIS — E1165 Type 2 diabetes mellitus with hyperglycemia: Secondary | ICD-10-CM

## 2022-05-13 DIAGNOSIS — E782 Mixed hyperlipidemia: Secondary | ICD-10-CM | POA: Diagnosis not present

## 2022-05-13 NOTE — Assessment & Plan Note (Signed)
Not on medications, working on lifestyle modification. Getting labs today

## 2022-05-13 NOTE — Assessment & Plan Note (Signed)
Doing well, no changes, followed by urology

## 2022-05-13 NOTE — Assessment & Plan Note (Signed)
Patient is not on medication, he has been working hard on lifestyle modification. Blood pressure at goal today 118/76.

## 2022-05-13 NOTE — Assessment & Plan Note (Signed)
Getting labs today.  Avoid NSAIDs

## 2022-05-13 NOTE — Assessment & Plan Note (Signed)
Patient is not on medication. He has been working hard on lifestyle modification. Getting labs today.

## 2022-05-14 LAB — COMPLETE METABOLIC PANEL WITH GFR
AG Ratio: 1.7 (calc) (ref 1.0–2.5)
ALT: 26 U/L (ref 9–46)
AST: 32 U/L (ref 10–35)
Albumin: 4.2 g/dL (ref 3.6–5.1)
Alkaline phosphatase (APISO): 60 U/L (ref 35–144)
BUN: 22 mg/dL (ref 7–25)
CO2: 26 mmol/L (ref 20–32)
Calcium: 9.3 mg/dL (ref 8.6–10.3)
Chloride: 105 mmol/L (ref 98–110)
Creat: 1.23 mg/dL (ref 0.70–1.35)
Globulin: 2.5 g/dL (calc) (ref 1.9–3.7)
Glucose, Bld: 115 mg/dL — ABNORMAL HIGH (ref 65–99)
Potassium: 5.1 mmol/L (ref 3.5–5.3)
Sodium: 138 mmol/L (ref 135–146)
Total Bilirubin: 0.3 mg/dL (ref 0.2–1.2)
Total Protein: 6.7 g/dL (ref 6.1–8.1)
eGFR: 64 mL/min/{1.73_m2} (ref >=60)

## 2022-05-14 LAB — LIPID PANEL
Cholesterol: 203 mg/dL — ABNORMAL HIGH (ref <200)
HDL: 57 mg/dL (ref >=40)
LDL Cholesterol (Calc): 130 mg/dL (calc) — ABNORMAL HIGH
Non-HDL Cholesterol (Calc): 146 mg/dL (calc) — ABNORMAL HIGH (ref <130)
Total CHOL/HDL Ratio: 3.6 (calc) (ref <5.0)
Triglycerides: 72 mg/dL (ref <150)

## 2022-05-14 LAB — CBC WITH DIFFERENTIAL/PLATELET
Absolute Monocytes: 510 cells/uL (ref 200–950)
Basophils Absolute: 78 cells/uL (ref 0–200)
Basophils Relative: 1.5 %
Eosinophils Absolute: 109 cells/uL (ref 15–500)
Eosinophils Relative: 2.1 %
HCT: 43.6 % (ref 38.5–50.0)
Hemoglobin: 14 g/dL (ref 13.2–17.1)
Lymphs Abs: 1134 cells/uL (ref 850–3900)
MCH: 26.7 pg — ABNORMAL LOW (ref 27.0–33.0)
MCHC: 32.1 g/dL (ref 32.0–36.0)
MCV: 83 fL (ref 80.0–100.0)
MPV: 10 fL (ref 7.5–12.5)
Monocytes Relative: 9.8 %
Neutro Abs: 3370 cells/uL (ref 1500–7800)
Neutrophils Relative %: 64.8 %
Platelets: 237 10*3/uL (ref 140–400)
RBC: 5.25 10*6/uL (ref 4.20–5.80)
RDW: 13 % (ref 11.0–15.0)
Total Lymphocyte: 21.8 %
WBC: 5.2 10*3/uL (ref 3.8–10.8)

## 2022-05-14 LAB — HEMOGLOBIN A1C
Hgb A1c MFr Bld: 6.1 % of total Hgb — ABNORMAL HIGH (ref <5.7)
Mean Plasma Glucose: 128 mg/dL
eAG (mmol/L): 7.1 mmol/L

## 2022-06-04 ENCOUNTER — Ambulatory Visit (INDEPENDENT_AMBULATORY_CARE_PROVIDER_SITE_OTHER): Payer: PPO

## 2022-06-04 VITALS — Ht 69.5 in | Wt 205.0 lb

## 2022-06-04 DIAGNOSIS — Z0000 Encounter for general adult medical examination without abnormal findings: Secondary | ICD-10-CM

## 2022-06-04 NOTE — Progress Notes (Signed)
I connected with  John Benson. on 06/04/22 by a audio enabled telemedicine application and verified that I am speaking with the correct person using two identifiers.  Patient Location: Home  Provider Location: Office/Clinic  I discussed the limitations of evaluation and management by telemedicine. The patient expressed understanding and agreed to proceed.  Subjective:   John Benson. is a 72 y.o. male who presents for Medicare Annual/Subsequent preventive examination.  Review of Systems    Cardiac Risk Factors include: advanced age (>25men, >102 women);dyslipidemia;hypertension;male gender    Objective:    Today's Vitals   06/04/22 0812  Weight: 205 lb (93 kg)  Height: 5' 9.5" (1.765 m)   Body mass index is 29.84 kg/m.     06/04/2022    8:19 AM 06/04/2021    8:22 AM 05/29/2020    8:48 AM 05/29/2019    8:59 AM 05/03/2019   12:45 PM 01/26/2019    9:20 AM 08/18/2018    3:00 PM  Advanced Directives  Does Patient Have a Medical Advance Directive? No No No No No No No  Would patient like information on creating a medical advance directive?  Yes (MAU/Ambulatory/Procedural Areas - Information given) Yes (MAU/Ambulatory/Procedural Areas - Information given) Yes (MAU/Ambulatory/Procedural Areas - Information given) No - Patient declined No - Patient declined No - Patient declined    Current Medications (verified) Outpatient Encounter Medications as of 06/04/2022  Medication Sig   BAYER ASPIRIN EC LOW DOSE 81 MG EC tablet Take 81 mg by mouth daily.    COLACE 100 MG capsule Take 100 mg by mouth daily.    Multiple Vitamin (MULTIVITAMIN) LIQD Take 10 mLs by mouth daily.   Omega-3 Fatty Acids (OMEGA-3 FISH OIL PO) Take 1 capsule by mouth daily.   sildenafil (VIAGRA) 100 MG tablet Take 1 tablet (100 mg total) by mouth daily as needed for erectile dysfunction.   No facility-administered encounter medications on file as of 06/04/2022.    Allergies (verified) Testosterone    History: Past Medical History:  Diagnosis Date   Acute kidney failure ([name]) 08/20/2018   Benign essential HTN    Calculus of kidney    Cancer ([name])    Carpal tunnel syndrome    Carpal tunnel syndrome 07/15/2014   Chronic kidney disease, stage II (mild)    Diabetes mellitus without complication ([name])    controlled by diet and exercise   History of kidney stones    HTN (hypertension)    maintained by diet and exercise   Hyperlipidemia    Hypogonadism in male    Obesity    Prostate cancer [facility])    Past Surgical History:  Procedure Laterality Date   COLONOSCOPY     LITHOTRIPSY     PELVIC LYMPH NODE DISSECTION Bilateral 08/18/2018   Procedure: PELVIC LYMPH NODE DISSECTION;  Surgeon: Sondra Come, MD;  Location: [facility] ORS;  Service: Urology;  Laterality: Bilateral;   ROBOT ASSISTED LAPAROSCOPIC RADICAL PROSTATECTOMY N/A 08/18/2018   Procedure: XI ROBOTIC ASSISTED LAPAROSCOPIC RADICAL PROSTATECTOMY;  Surgeon: Sondra Come, MD;  Location: [facility] ORS;  Service: Urology;  Laterality: N/A;   Family History  Problem Relation Age of Onset   Diabetes Mother    Cancer Mother        breast   Vision loss Mother    Hypertension Father    Dementia Father    Diabetes Brother    Early death Brother    Pancreatic disease Brother    Early death Sister  Kidney disease Sister    Kidney cancer Neg Hx    Prostate cancer Neg Hx    Social History   Socioeconomic History   Marital status: Married    Spouse name: Not on file   Number of children: Not on file   Years of education: Not on file   Highest education level: Bachelor's degree (e.g., BA, AB, BS)  Occupational History   Not on file  Tobacco Use   Smoking status: Never    Passive exposure: Never   Smokeless tobacco: Never  Vaping Use   Vaping Use: Never used  Substance and Sexual Activity   Alcohol use: No   Drug use: No   Sexual activity: Yes  Other Topics Concern   Not on file  Social History Narrative   Not on  file   Social Determinants of Health   Financial Resource Strain: Low Risk  (06/04/2022)   Overall Financial Resource Strain (CARDIA)    Difficulty of Paying Living Expenses: Not hard at all  Food Insecurity: No Food Insecurity (06/04/2022)   Hunger Vital Sign    Worried About Running Out of Food in the Last Year: Never true    Ran Out of Food in the Last Year: Never true  Transportation Needs: No Transportation Needs (06/04/2022)   PRAPARE - Administrator, Civil Service (Medical): No    Lack of Transportation (Non-Medical): No  Physical Activity: Sufficiently Active (06/04/2022)   Exercise Vital Sign    Days of Exercise per Week: 7 days    Minutes of Exercise per Session: 60 min  Stress: No Stress Concern Present (06/04/2022)   [employer] of Occupational Health - Occupational Stress Questionnaire    Feeling of Stress : Not at all  Social Connections: Socially Integrated (06/04/2022)   Social Connection and Isolation Panel [NHANES]    Frequency of Communication with Friends and Family: Once a week    Frequency of Social Gatherings with Friends and Family: Three times a week    Attends Religious Services: More than 4 times per year    Active Member of Clubs or Organizations: Yes    Attends Engineer, structural: More than 4 times per year    Marital Status: Married    Tobacco Counseling Counseling given: Not Answered   Clinical Intake:  Pre-visit preparation completed: Yes  Pain : No/denies pain     BMI - recorded: 29.84 Nutritional Status: BMI 25 -29 Overweight Nutritional Risks: None Diabetes: Yes  How often do you need to have someone help you when you read instructions, pamphlets, or other written materials from your doctor or pharmacy?: (P) 1 - Never  Diabetic? YES  Interpreter Needed?: No  Comments: lives with wife Information entered by :: B.Shalayah Beagley,LPN   Activities of Daily Living    06/04/2022    8:09 AM 05/13/2022    8:03 AM   In your present state of health, do you have any difficulty performing the following activities:  Hearing? 0 0  Vision? 0 0  Difficulty concentrating or making decisions? 0 0  Walking or climbing stairs? 0 0  Dressing or bathing? 0 0  Doing errands, shopping? 0 0  Preparing Food and eating ? N   Using the Toilet? N   In the past six months, have you accidently leaked urine? N   Do you have problems with loss of bowel control? N   Managing your Medications? N   Managing your Finances? N   Housekeeping  or managing your Housekeeping? N     Patient Care Team: Berniece Salines, FNP as PCP - General (Nurse Practitioner) Carmina Miller, MD as Radiation Oncologist (Radiation Oncology) Sondra Come, MD as Consulting Physician (Urology)  Indicate any recent Medical Services you may have received from other than [facility] providers in the past year (date may be approximate).     Assessment:   This is a routine wellness examination for Chrissie Noa.  Hearing/Vision screen Hearing Screening - Comments:: Adequate hearing Vision Screening - Comments:: Adequate vision w/glasses [facility]  Dietary issues and exercise activities discussed: Current Exercise Habits: Home exercise routine, Type of exercise: treadmill, Time (Minutes): 60, Frequency (Times/Week): 7, Weekly Exercise (Minutes/Week): 420, Intensity: Mild, Exercise limited by: None identified   Goals Addressed   None    Depression Screen    06/04/2022    8:18 AM 05/13/2022    8:04 AM 01/11/2022    8:28 AM 09/11/2021    8:15 AM 06/11/2021    8:06 AM 06/04/2021    8:21 AM 04/21/2021    7:48 AM  PHQ 2/9 Scores  PHQ - 2 Score 0 0 0 0 0 0 0  PHQ- 9 Score     0      Fall Risk    06/04/2022    8:09 AM 05/13/2022    8:03 AM 01/11/2022    8:28 AM 09/11/2021    8:14 AM 06/11/2021    8:06 AM  Fall Risk   Falls in the past year? 0 0 0 0 0  Number falls in past yr: 0 0 0 0 0  Injury with Fall? 0 0 0 0 0  Risk for fall due to : No Fall Risks    No  Fall Risks  Follow up Education provided  Falls evaluation completed Falls evaluation completed Falls prevention discussed;Education provided    FALL RISK PREVENTION PERTAINING TO THE HOME:  Any stairs in or around the home? No  If so, are there any without handrails? No  Home free of loose throw rugs in walkways, pet beds, electrical cords, etc? Yes  Adequate lighting in your home to reduce risk of falls? Yes   ASSISTIVE DEVICES UTILIZED TO PREVENT FALLS:  Life alert? No  Use of a cane, walker or w/c? No  Grab bars in the bathroom? Yes  Shower chair or bench in shower? No  Elevated toilet seat or a handicapped toilet? Yes   Cognitive Function:        06/04/2022    8:22 AM 05/25/2018    1:40 PM  6CIT Screen  What Year? 0 points 0 points  What month? 0 points 0 points  What time? 0 points 0 points  Count back from 20 0 points 0 points  Months in reverse 0 points 0 points  Repeat phrase 0 points 2 points  Total Score 0 points 2 points    Immunizations Immunization History  Administered Date(s) Administered   Fluad Quad(high Dose 65+) 09/11/2021   Influenza, High Dose Seasonal PF 10/08/2018   Influenza, Seasonal, Injecte, Preservative Fre 10/12/2013   Influenza,inj,Quad PF,6+ Mos 10/26/2012, 09/30/2015, 10/22/2016, 09/29/2017   Influenza-Unspecified 09/25/2014, 09/29/2017, 09/29/2019, 09/20/2020   Moderna Covid-19 Vaccine Bivalent Booster 24yrs & up 05/23/2021   Moderna Sars-Covid-2 Vaccination 01/11/2019, 02/08/2019, 10/27/2019, 04/12/2020   PNEUMOCOCCAL CONJUGATE-20 05/23/2021   Pneumococcal Conjugate-13 04/30/2013   Pneumococcal Polysaccharide-23 07/21/2010, 02/19/2020   Tdap 07/21/2010   Zoster Recombinat (Shingrix) 06/11/2021, 09/11/2021    TDAP status: Up to date  Flu Vaccine status: Up to date  Pneumococcal vaccine status: Up to date  Covid-19 vaccine status: Completed vaccines  Qualifies for Shingles Vaccine? Yes   Zostavax completed Yes   Shingrix  Completed?: Yes  Screening Tests Health Maintenance  Topic Date Due   COVID-19 Vaccine (6 - 2023-24 season) 09/04/2021   Diabetic kidney evaluation - Urine ACR  06/12/2022   INFLUENZA VACCINE  08/05/2022   HEMOGLOBIN A1C  11/13/2022   OPHTHALMOLOGY EXAM  03/26/2023   Diabetic kidney evaluation - eGFR measurement  05/13/2023   FOOT EXAM  05/13/2023   Medicare Annual Wellness (AWV)  06/04/2023   Colonoscopy  12/26/2023   Pneumonia Vaccine 24+ Years old  Completed   Hepatitis C Screening  Completed   Zoster Vaccines- Shingrix  Completed   HPV VACCINES  Aged Out   DTaP/Tdap/Td  Discontinued    Health Maintenance  Health Maintenance Due  Topic Date Due   COVID-19 Vaccine (6 - 2023-24 season) 09/04/2021   Diabetic kidney evaluation - Urine ACR  06/12/2022    Colorectal cancer screening: Type of screening: Colonoscopy. Completed yes. Repeat every 10 years  Lung Cancer Screening: (Low Dose CT Chest recommended if Age 33-80 years, 30 pack-year currently smoking OR have quit w/in 15years.) does not qualify.   Lung Cancer Screening Referral: no  Additional Screening:  Hepatitis C Screening: does not qualify; Completed no  Vision Screening: Recommended annual ophthalmology exams for early detection of glaucoma and other disorders of the eye. Is the patient up to date with their annual eye exam?  Yes  Who is the provider or what is the name of the office in which the patient attends annual eye exams? [facility] If pt is not established with a provider, would they like to be referred to a provider to establish care? No .   Dental Screening: Recommended annual dental exams for proper oral hygiene  Community Resource Referral / Chronic Care Management: CRR required this visit?  No   CCM required this visit?  No      Plan:     I have personally reviewed and noted the following in the patient's chart:   Medical and social history Use of alcohol, tobacco or illicit drugs   Current medications and supplements including opioid prescriptions. Patient is not currently taking opioid prescriptions. Functional ability and status Nutritional status Physical activity Advanced directives List of other physicians Hospitalizations, surgeries, and ER visits in previous 12 months Vitals Screenings to include cognitive, depression, and falls Referrals and appointments  In addition, I have reviewed and discussed with patient certain preventive protocols, quality metrics, and best practice recommendations. A written personalized care plan for preventive services as well as general preventive health recommendations were provided to patient.     Sue Lush, LPN   04/12/8117   Nurse Notes: The patient states he is doing well and has no concerns or questions at this time.

## 2022-06-04 NOTE — Patient Instructions (Signed)
John Benson , Thank you for taking time to come for your Medicare Wellness Visit. I appreciate your ongoing commitment to your health goals. Please review the following plan we discussed and let me know if I can assist you in the future.   These are the goals we discussed:  Goals      Weight (lb) < 205 lb (93 kg)     Pt states he would like to lose 10-15 lbs over the next year        This is a list of the screening recommended for you and due dates:  Health Maintenance  Topic Date Due   COVID-19 Vaccine (6 - 2023-24 season) 09/04/2021   Yearly kidney health urinalysis for diabetes  06/12/2022   Flu Shot  08/05/2022   Hemoglobin A1C  11/13/2022   Eye exam for diabetics  03/26/2023   Yearly kidney function blood test for diabetes  05/13/2023   Complete foot exam   05/13/2023   Medicare Annual Wellness Visit  06/04/2023   Colon Cancer Screening  12/26/2023   Pneumonia Vaccine  Completed   Hepatitis C Screening  Completed   Zoster (Shingles) Vaccine  Completed   HPV Vaccine  Aged Out   DTaP/Tdap/Td vaccine  Discontinued    Advanced directives: no mailed  Conditions/risks identified: none  Next appointment: Follow up in one year for your annual wellness visit. 06/10/2023 @ 8:15am telephone  Preventive Care 65 Years and Older, Male  Preventive care refers to lifestyle choices and visits with your health care provider that can promote health and wellness. What does preventive care include? A yearly physical exam. This is also called an annual well check. Dental exams once or twice a year. Routine eye exams. Ask your health care provider how often you should have your eyes checked. Personal lifestyle choices, including: Daily care of your teeth and gums. Regular physical activity. Eating a healthy diet. Avoiding tobacco and drug use. Limiting alcohol use. Practicing safe sex. Taking low doses of aspirin every day. Taking vitamin and mineral supplements as recommended by your  health care provider. What happens during an annual well check? The services and screenings done by your health care provider during your annual well check will depend on your age, overall health, lifestyle risk factors, and family history of disease. Counseling  Your health care provider may ask you questions about your: Alcohol use. Tobacco use. Drug use. Emotional well-being. Home and relationship well-being. Sexual activity. Eating habits. History of falls. Memory and ability to understand (cognition). Work and work Astronomer. Screening  You may have the following tests or measurements: Height, weight, and BMI. Blood pressure. Lipid and cholesterol levels. These may be checked every 5 years, or more frequently if you are over 40 years old. Skin check. Lung cancer screening. You may have this screening every year starting at age 20 if you have a 30-pack-year history of smoking and currently smoke or have quit within the past 15 years. Fecal occult blood test (FOBT) of the stool. You may have this test every year starting at age 42. Flexible sigmoidoscopy or colonoscopy. You may have a sigmoidoscopy every 5 years or a colonoscopy every 10 years starting at age 39. Prostate cancer screening. Recommendations will vary depending on your family history and other risks. Hepatitis C blood test. Hepatitis B blood test. Sexually transmitted disease (STD) testing. Diabetes screening. This is done by checking your blood sugar (glucose) after you have not eaten for a while (fasting). You may  have this done every 1-3 years. Abdominal aortic aneurysm (AAA) screening. You may need this if you are a current or former smoker. Osteoporosis. You may be screened starting at age 15 if you are at high risk. Talk with your health care provider about your test results, treatment options, and if necessary, the need for more tests. Vaccines  Your health care provider may recommend certain vaccines, such  as: Influenza vaccine. This is recommended every year. Tetanus, diphtheria, and acellular pertussis (Tdap, Td) vaccine. You may need a Td booster every 10 years. Zoster vaccine. You may need this after age 29. Pneumococcal 13-valent conjugate (PCV13) vaccine. One dose is recommended after age 58. Pneumococcal polysaccharide (PPSV23) vaccine. One dose is recommended after age 13. Talk to your health care provider about which screenings and vaccines you need and how often you need them. This information is not intended to replace advice given to you by your health care provider. Make sure you discuss any questions you have with your health care provider. Document Released: 01/17/2015 Document Revised: 09/10/2015 Document Reviewed: 10/22/2014 Elsevier Interactive Patient Education  2017 [employer].  Fall Prevention in the Home Falls can cause injuries. They can happen to people of all ages. There are many things you can do to make your home safe and to help prevent falls. What can I do on the outside of my home? Regularly fix the edges of walkways and driveways and fix any cracks. Remove anything that might make you trip as you walk through a door, such as a raised step or threshold. Trim any bushes or trees on the path to your home. Use bright outdoor lighting. Clear any walking paths of anything that might make someone trip, such as rocks or tools. Regularly check to see if handrails are loose or broken. Make sure that both sides of any steps have handrails. Any raised decks and porches should have guardrails on the edges. Have any leaves, snow, or ice cleared regularly. Use sand or salt on walking paths during winter. Clean up any spills in your garage right away. This includes oil or grease spills. What can I do in the bathroom? Use night lights. Install grab bars by the toilet and in the tub and shower. Do not use towel bars as grab bars. Use non-skid mats or decals in the tub or  shower. If you need to sit down in the shower, use a plastic, non-slip stool. Keep the floor dry. Clean up any water that spills on the floor as soon as it happens. Remove soap buildup in the tub or shower regularly. Attach bath mats securely with double-sided non-slip rug tape. Do not have throw rugs and other things on the floor that can make you trip. What can I do in the bedroom? Use night lights. Make sure that you have a light by your bed that is easy to reach. Do not use any sheets or blankets that are too big for your bed. They should not hang down onto the floor. Have a firm chair that has side arms. You can use this for support while you get dressed. Do not have throw rugs and other things on the floor that can make you trip. What can I do in the kitchen? Clean up any spills right away. Avoid walking on wet floors. Keep items that you use a lot in easy-to-reach places. If you need to reach something above you, use a strong step stool that has a grab bar. Keep electrical cords  out of the way. Do not use floor polish or wax that makes floors slippery. If you must use wax, use non-skid floor wax. Do not have throw rugs and other things on the floor that can make you trip. What can I do with my stairs? Do not leave any items on the stairs. Make sure that there are handrails on both sides of the stairs and use them. Fix handrails that are broken or loose. Make sure that handrails are as long as the stairways. Check any carpeting to make sure that it is firmly attached to the stairs. Fix any carpet that is loose or worn. Avoid having throw rugs at the top or bottom of the stairs. If you do have throw rugs, attach them to the floor with carpet tape. Make sure that you have a light switch at the top of the stairs and the bottom of the stairs. If you do not have them, ask someone to add them for you. What else can I do to help prevent falls? Wear shoes that: Do not have high heels. Have  rubber bottoms. Are comfortable and fit you well. Are closed at the toe. Do not wear sandals. If you use a stepladder: Make sure that it is fully opened. Do not climb a closed stepladder. Make sure that both sides of the stepladder are locked into place. Ask someone to hold it for you, if possible. Clearly mark and make sure that you can see: Any grab bars or handrails. First and last steps. Where the edge of each step is. Use tools that help you move around (mobility aids) if they are needed. These include: Canes. Walkers. Scooters. Crutches. Turn on the lights when you go into a dark area. Replace any light bulbs as soon as they burn out. Set up your furniture so you have a clear path. Avoid moving your furniture around. If any of your floors are uneven, fix them. If there are any pets around you, be aware of where they are. Review your medicines with your doctor. Some medicines can make you feel dizzy. This can increase your chance of falling. Ask your doctor what other things that you can do to help prevent falls. This information is not intended to replace advice given to you by your health care provider. Make sure you discuss any questions you have with your health care provider. Document Released: 10/17/2008 Document Revised: 05/29/2015 Document Reviewed: 01/25/2014 Elsevier Interactive Patient Education  2017 [employer].

## 2022-07-22 ENCOUNTER — Inpatient Hospital Stay: Payer: PPO | Attending: Radiation Oncology

## 2022-07-22 DIAGNOSIS — Z923 Personal history of irradiation: Secondary | ICD-10-CM | POA: Insufficient documentation

## 2022-07-22 DIAGNOSIS — C61 Malignant neoplasm of prostate: Secondary | ICD-10-CM | POA: Insufficient documentation

## 2022-07-22 LAB — PSA: Prostatic Specific Antigen: 0.02 ng/mL (ref 0.00–4.00)

## 2022-07-29 ENCOUNTER — Ambulatory Visit: Payer: PPO | Admitting: Radiation Oncology

## 2022-08-05 ENCOUNTER — Ambulatory Visit
Admission: RE | Admit: 2022-08-05 | Discharge: 2022-08-05 | Disposition: A | Discharge status: Home / Self Care | Payer: PPO | Source: Ambulatory Visit | Attending: Radiation Oncology | Admitting: Radiation Oncology

## 2022-08-05 ENCOUNTER — Encounter: Payer: Self-pay | Admitting: Radiation Oncology

## 2022-08-05 VITALS — BP 165/85 | HR 59 | Temp 97.1°F | Resp 16 | Wt 201.0 lb

## 2022-08-05 DIAGNOSIS — Z923 Personal history of irradiation: Secondary | ICD-10-CM | POA: Diagnosis not present

## 2022-08-05 DIAGNOSIS — C61 Malignant neoplasm of prostate: Secondary | ICD-10-CM | POA: Insufficient documentation

## 2022-08-05 NOTE — Progress Notes (Signed)
Radiation Oncology Follow up Note  Name: John Benson.   Date:   08/05/2022 MRN:  [id number] DOB: 07/05/1950    This 72 y.o. male presents to the clinic today for 3-1/2-year follow-up status post salvage radiation therapy to his prostatic fossa and pelvic nodes for a Gleason 9 (4+5 adenocarcinoma with biochemical failure.  REFERRING PROVIDER: Berniece Salines, FNP  HPI: Patient is a 72 year old male now out over 3 and half years having completed salvage radiation therapy for a Gleason 9 adenocarcinoma status post robotic assisted prostatectomy with biochemical failure.  Seen today in routine follow-up he is doing well.  He specifically denies any increased lower urinary tract symptoms diarrhea or fatigue.  His most recent PSA is 0.02.Marland Kitchen  COMPLICATIONS OF TREATMENT: none  FOLLOW UP COMPLIANCE: keeps appointments   PHYSICAL EXAM:  BP (!) 165/85   Pulse (!) 59   Temp (!) 97.1 F (36.2 C) (Tympanic)   Resp 16   Wt 201 lb (91.2 kg)   BMI 29.26 kg/m  Well-developed well-nourished patient in NAD. HEENT reveals PERLA, EOMI, discs not visualized.  Oral cavity is clear. No oral mucosal lesions are identified. Neck is clear without evidence of cervical or supraclavicular adenopathy. Lungs are clear to A&P. Cardiac examination is essentially unremarkable with regular rate and rhythm without murmur rub or thrill. Abdomen is benign with no organomegaly or masses noted. Motor sensory and DTR levels are equal and symmetric in the upper and lower extremities. Cranial nerves II through XII are grossly intact. Proprioception is intact. No peripheral adenopathy or edema is identified. No motor or sensory levels are noted. Crude visual fields are within normal range.  RADIOLOGY RESULTS: No current films for review  PLAN: Present time patient continues under excellent biochemical control of his prostate cancer.  At this time will turn follow-up care over to urology and his GP.  I would be happy to  reevaluate the patient anytime should that be indicated.  Patient is to call with any concerns.  I would like to take this opportunity to thank you for allowing me to participate in the care of your patient.Carmina Miller, MD

## 2022-08-16 ENCOUNTER — Encounter: Payer: Self-pay | Admitting: Internal Medicine

## 2022-08-16 ENCOUNTER — Telehealth: Payer: PPO | Admitting: Internal Medicine

## 2022-08-16 ENCOUNTER — Ambulatory Visit: Payer: Self-pay

## 2022-08-16 DIAGNOSIS — U071 COVID-19: Secondary | ICD-10-CM | POA: Diagnosis not present

## 2022-08-16 MED ORDER — NIRMATRELVIR/RITONAVIR (PAXLOVID)TABLET: 3.0000 | ORAL_TABLET | Freq: Two times a day (BID) | ORAL | 0 refills | Status: AC

## 2022-08-16 MED ORDER — FLUTICASONE PROPIONATE 50 MCG/ACT NA SUSP: 2.0000 | Freq: Every day | NASAL | 6 refills | Status: DC

## 2022-08-16 MED ORDER — BENZONATATE 100 MG PO CAPS
100.0000 mg | ORAL_CAPSULE | Freq: Two times a day (BID) | ORAL | 0 refills | Status: DC | PRN
Start: 2022-08-16 — End: 2022-11-16

## 2022-08-16 NOTE — Telephone Encounter (Signed)
  Chief Complaint: fatigue PND Symptoms: muscle aching, chest pressure with coughing only Frequency: Sat day 0 Pertinent Negatives: Patient denies Radiating chest pain, headache sore throat Disposition: [] ED /[] Urgent Care (no appt availability in office) / [x] Appointment(In office/virtual)/ []  [facility]/ [] Home Care/ [] Refused Recommended Disposition /[] [facility]/ []  Follow-up with PCP Additional Notes: Virtual appt today with provider. Pt refused appt with PCP later in week Reason for Disposition  [1] HIGH RISK patient (e.g., weak immune system, age > 64 years, obesity with BMI 30 or higher, pregnant, chronic lung disease or other chronic medical condition) AND [2] COVID symptoms (e.g., cough, fever)  (Exceptions: Already seen by PCP and no new or worsening symptoms.)  Answer Assessment - Initial Assessment Questions 1. COVID-19 DIAGNOSIS: "How do you know that you have COVID?" (e.g., positive lab test or self-test, diagnosed by doctor or NP/PA, symptoms after exposure).     This am   3. ONSET: "When did the COVID-19 symptoms start?"      Sat  4. WORST SYMPTOM: "What is your worst symptom?" (e.g., cough, fever, shortness of breath, muscle aches)     Fatigue, post nasal drainage  5. COUGH: "Do you have a cough?" If Yes, ask: "How bad is the cough?"       Yes  6. FEVER: "Do you have a fever?" If Yes, ask: "What is your temperature, how was it measured, and when did it start?"     no 7. RESPIRATORY STATUS: "Describe your breathing?" (e.g., normal; shortness of breath, wheezing, unable to speak)      Feels different  8. BETTER-SAME-WORSE: "Are you getting better, staying the same or getting worse compared to yesterday?"  If getting worse, ask, "In what way?"     better 9. OTHER SYMPTOMS: "Do you have any other symptoms?"  (e.g., chills, fatigue, headache, loss of smell or taste, muscle pain, sore throat)     Fatigue, muscle aching, chest pressure feels like it when  cough  10. HIGH RISK DISEASE: "Do you have any chronic medical problems?" (e.g., asthma, heart or lung disease, weak immune system, obesity, etc.)       Elderly  Protocols used: Coronavirus (COVID-19) Diagnosed or Suspected-A-AH

## 2022-08-16 NOTE — Progress Notes (Signed)
Virtual Visit via Video Note  I connected with John Benson. on 08/16/22 at 10:00 AM EDT by a video enabled telemedicine application and verified that I am speaking with the correct person using two identifiers.  Location: Patient: Home Provider: [facility]   I discussed the limitations of evaluation and management by telemedicine and the availability of in person appointments. The patient expressed understanding and agreed to proceed.  History of Present Illness:  Patient is presenting via telemedicine to discuss positive home COVID test. Symptoms started 2 days ago. Patient had a positive home COVID test this morning. He has no know sick contacts. Symptoms include fatigue, feeling cold/chills, sinus drainage, dry cough, and right ear pressure. Having some SOB with talking and voice hoarseness as well. Symptoms overall improving. Not taking anything over the counter for symptoms.   Observations/Objective:  General: well appearing, no acute distress ENT: conjunctiva normal appearing bilaterally  Skin: no rashes, cyanosis or abnormal bruising noted Neuro: answers all questions appropriately   Assessment and Plan:  1. COVID-19: Symptoms fairly mild, will prescribe anti-viral as he is within the timing window but I think he would do fine without it. Will also send in cough suppressants and Flonase to help with sinus drainage. Otherwise recommend rest, hydration and wearing a mask around others until symptoms resolve. Patient does have a pulse oximeter at home, will monitor oxygen stats. Discussed when to present to ER. Patient will call back if symptoms worsen or fail to improve.   - nirmatrelvir/ritonavir (PAXLOVID) 20 x 150 MG & 10 x 100MG  TABS; Take 3 tablets by mouth 2 (two) times daily for 5 days. (Take nirmatrelvir 150 mg two tablets twice daily for 5 days and ritonavir 100 mg one tablet twice daily for 5 days) Patient GFR is 64.  Dispense: 30 tablet; Refill: 0 - fluticasone (FLONASE) 50  MCG/ACT nasal spray; Place 2 sprays into both nostrils daily.  Dispense: 16 g; Refill: 6 - benzonatate (TESSALON) 100 MG capsule; Take 1 capsule (100 mg total) by mouth 2 (two) times daily as needed for cough.  Dispense: 20 capsule; Refill: 0   Follow Up Instructions: PRN    I discussed the assessment and treatment plan with the patient. The patient was provided an opportunity to ask questions and all were answered. The patient agreed with the plan and demonstrated an understanding of the instructions.   The patient was advised to call back or seek an in-person evaluation if the symptoms worsen or if the condition fails to improve as anticipated.  I provided 10 minutes of non-face-to-face time during this encounter.   Margarita Mail, DO

## 2022-08-25 ENCOUNTER — Telehealth: Payer: Self-pay | Admitting: Urology

## 2022-08-25 NOTE — Telephone Encounter (Signed)
Patient called to ask if PSA lab that he had done at the [facility] on 07/22/22 could be used, or if he still needed to keep his lab appt with our office on 08/27/22. Please advise patient.

## 2022-08-27 ENCOUNTER — Other Ambulatory Visit: Payer: PPO

## 2022-09-02 ENCOUNTER — Encounter: Payer: Self-pay | Admitting: Urology

## 2022-09-02 ENCOUNTER — Ambulatory Visit: Payer: PPO | Admitting: Urology

## 2022-09-02 VITALS — BP 166/78 | HR 57 | Ht 69.5 in | Wt 202.0 lb

## 2022-09-02 DIAGNOSIS — N529 Male erectile dysfunction, unspecified: Secondary | ICD-10-CM | POA: Diagnosis not present

## 2022-09-02 DIAGNOSIS — C61 Malignant neoplasm of prostate: Secondary | ICD-10-CM | POA: Diagnosis not present

## 2022-09-02 MED ORDER — SILDENAFIL CITRATE 100 MG PO TABS: 100.0000 mg | ORAL_TABLET | Freq: Every day | ORAL | 6 refills | Status: DC | PRN

## 2022-09-02 NOTE — Patient Instructions (Signed)
If you are interested in considering a penile implant, Dr. Lafonda Mosses is the urologist in [place] who performs these with [facility].  You can reach out to their office to set up an appointment.

## 2022-09-02 NOTE — Progress Notes (Signed)
   09/02/2022 9:08 AM   John Benson. 02-May-1950 [id number]  Reason for visit: Follow up high risk prostate cancer, ED, stress incontinence  HPI: He is a 72 year old African-American male who had a long history of significantly elevated PSA before following up with urology and undergoing a prostate biopsy on 05/18/2018 for an elevated PSA of 20.  This showed high risk prostate cancer, and staging imaging showed no evidence of metastatic disease. He underwent a robotic prostatectomy and bilateral lymphadenectomy on 08/18/2018 with final pathology showing no lymph node involvement, however Gleason score 4+5=9 stage pT3a prostate adenocarcinoma with extraprostatic extension, lymphovascular invasion, perineural invasion, and positive margins at the left apex, and bilaterally.  Carcinoma was present within the vascular bundles outside the muscular layer of the right SV.   His initial post-op PSA in October 2020 was undetectable, however PSA in January 2021 was 0.2, and confirmed at 0.2 on repeat later that week.  He underwent salvage radiation with Dr. Rushie Chestnut that was completed in March 2021.    Post radiation PSA was undetectable, and remains undetectable on most recent PSA dated 07/22/2022(0.02).     At baseline prior to surgery he had moderate to severe ED that was only minimally improved with PDE5 inhibitors.  He has previously tried Cialis postoperatively with no improvement in ED, as well as penile injections with Michiel Cowboy, PA.  He had significant pain with the penile injections and is not interested in pursuing this further.   He has had some improvement with the erections on the 100 mg sildenafil as needed, and this was refilled today.  I also discussed penile prosthesis and offered referral to Dr. Lafonda Mosses in [place] to discuss further, he is undecided at this time but will let us know if he would like a referral.  He is doing well from a urination perspective.  He has mild leakage,  and wears a pad with physical activity.  He has urgent send in the morning when he drinks coffee, but this is not bothersome enough to consider medications.  We discussed trial of a Cunningham clamp if he has worsening of his stress incontinence.    We discussed the risk of recurrence with his high risk disease, and the need for close PSA monitoring.  Continue yearly PSA screening.   Sildenafil 100 mg as needed refilled RTC 1 year PSA prior   Sondra Come, MD  [facility] [address] [place], [place] [address] [phone]

## 2022-09-02 NOTE — Addendum Note (Signed)
Addended by: Frankey Shown on: 09/02/2022 09:22 AM   Modules accepted: Orders

## 2022-11-16 ENCOUNTER — Ambulatory Visit (INDEPENDENT_AMBULATORY_CARE_PROVIDER_SITE_OTHER): Payer: PPO | Admitting: Nurse Practitioner

## 2022-11-16 ENCOUNTER — Encounter: Payer: Self-pay | Admitting: Nurse Practitioner

## 2022-11-16 ENCOUNTER — Other Ambulatory Visit: Payer: Self-pay

## 2022-11-16 VITALS — BP 126/82 | HR 92 | Temp 97.8°F | Resp 16 | Ht 69.5 in | Wt 198.2 lb

## 2022-11-16 DIAGNOSIS — E1165 Type 2 diabetes mellitus with hyperglycemia: Secondary | ICD-10-CM | POA: Diagnosis not present

## 2022-11-16 DIAGNOSIS — C61 Malignant neoplasm of prostate: Secondary | ICD-10-CM | POA: Diagnosis not present

## 2022-11-16 DIAGNOSIS — I10 Essential (primary) hypertension: Secondary | ICD-10-CM

## 2022-11-16 DIAGNOSIS — N1831 Chronic kidney disease, stage 3a: Secondary | ICD-10-CM

## 2022-11-16 DIAGNOSIS — E782 Mixed hyperlipidemia: Secondary | ICD-10-CM | POA: Diagnosis not present

## 2022-11-16 NOTE — Progress Notes (Signed)
BP 126/82   Pulse 92   Temp 97.8 F (36.6 C) (Oral)   Resp 16   Ht 5' 9.5" (1.765 m)   Wt 198 lb 3.2 oz (89.9 kg)   SpO2 98%   BMI 28.85 kg/m    Subjective:    Patient ID: John Benson., male    DOB: March 28, 1950, 72 y.o.   MRN: [id number]  HPI: John Benson is a 72 y.o. male  Chief Complaint  Patient presents with   Medical Management of Chronic Issues    6 month recheck   Diabetes, Type 2:  -Last A1c 6.1 -Medications: none, controlled with diet -Checking BG at home: no -Diet: reduce sugar and processed foods in your diet  -Exercise: recommend 150 min of physical activity weekly   -Eye exam: utd -Foot exam: utd -Microalbumin: due -Statin: no -PNA vaccine: yes -Denies symptoms of hypoglycemia, polyuria, polydipsia, numbness extremities, foot ulcers/trauma.   Hypertension:  -Medications: none -Patient is doing lifestyle modification -Checking BP at home (average): does not check at home -Denies any SOB, CP, vision changes, LE edema or symptoms of hypotension -Diet: recommend DASH diet  -Exercise: recommend 150 min of physical activity weekly       11/16/2022    7:34 AM 09/02/2022    8:29 AM 08/05/2022    9:18 AM  Vitals with BMI  Height 5' 9.5" 5' 9.5"   Weight 198 lbs 3 oz 202 lbs 201 lbs  BMI 28.86 29.41   Systolic 126 166 409  Diastolic 82 78 85  Pulse 92 57 59     HLD:  -Medications: none -Patient is working on lifestyle modification -Last lipid panel:  Lipid Panel     Component Value Date/Time   CHOL 203 (H) 05/13/2022 0830   CHOL 260 (H) 10/28/2014 0900   TRIG 72 05/13/2022 0830   HDL 57 05/13/2022 0830   HDL 67 10/28/2014 0900   CHOLHDL 3.6 05/13/2022 0830   VLDL 12 07/22/2016 0856   LDLCALC 130 (H) 05/13/2022 0830   LDLDIRECT 133 (H) 02/20/2020 0920   LABVLDL 24 10/28/2014 0900    The 10-year ASCVD risk score (Arnett DK, et al., 2019) is: 19.4%   Values used to calculate the score:     Age: 21 years     Sex: Male     Is  Non-Hispanic African American: Yes     Diabetic: Yes     Tobacco smoker: No     Systolic Benson Pressure: 126 mmHg     Is BP treated: No     HDL Cholesterol: 57 mg/dL     Total Cholesterol: 203 mg/dL   Prostate cancer:patient does yearly PSAs. His last PSA was in normal range.  He is established with urology, last seen on 09/02/2022.   CKD:  last GFR back in normal range.   Relevant past medical, surgical, family and social history reviewed and updated as indicated. Interim medical history since our last visit reviewed. Allergies and medications reviewed and updated.  Review of Systems  Constitutional: Negative for fever or weight change.  Respiratory: Negative for cough and shortness of breath.   Cardiovascular: Negative for chest pain or palpitations.  Gastrointestinal: Negative for abdominal pain, no bowel changes.  Musculoskeletal: Negative for gait problem or joint swelling.  Skin: Negative for rash.  Neurological: Negative for dizziness or headache.  No other specific complaints in a complete review of systems (except as listed in HPI above).  Objective:    BP 126/82   Pulse 92   Temp 97.8 F (36.6 C) (Oral)   Resp 16   Ht 5' 9.5" (1.765 m)   Wt 198 lb 3.2 oz (89.9 kg)   SpO2 98%   BMI 28.85 kg/m   Wt Readings from Last 3 Encounters:  11/16/22 198 lb 3.2 oz (89.9 kg)  09/02/22 202 lb (91.6 kg)  08/05/22 201 lb (91.2 kg)    Physical Exam  Constitutional: Patient appears well-developed and well-nourished. Obese  No distress.  HEENT: head atraumatic, normocephalic, pupils equal and reactive to light,  neck supple Cardiovascular: Normal rate, regular rhythm and normal heart sounds.  No murmur heard. No BLE edema. Pulmonary/Chest: Effort normal and breath sounds normal. No respiratory distress. Abdominal: Soft.  There is no tenderness. Psychiatric: Patient has a normal mood and affect. behavior is normal. Judgment and thought content normal.   Results for  orders placed or performed in visit on 07/22/22  PSA  Result Value Ref Range   Prostatic Specific Antigen 0.02 0.00 - 4.00 ng/mL      Assessment & Plan:   Problem List Items Addressed This Visit       Cardiovascular and Mediastinum   Essential hypertension    Patient is working on lifestyle modification including exercise and diet.      Relevant Orders   CBC with Differential/Platelet   COMPLETE METABOLIC PANEL WITH GFR     Endocrine   Type 2 diabetes mellitus with hyperglycemia, without long-term current use of insulin ([name]) - Primary    Patient is working on lifestyle modification including exercise and diet.      Relevant Orders   Microalbumin / creatinine urine ratio   COMPLETE METABOLIC PANEL WITH GFR   Hemoglobin A1c     Genitourinary   Prostate cancer [facility])    Managed by urology, doing well        Other   Mixed hyperlipidemia    Patient is working on lifestyle modification including exercise and diet.      Relevant Orders   Lipid panel   Other Visit Diagnoses     Stage 3a chronic kidney disease ([name])       has improved         Follow up plan: Return in about 6 months (around 05/16/2023) for follow up.

## 2022-11-16 NOTE — Assessment & Plan Note (Signed)
Patient is working on lifestyle modification including exercise and diet.

## 2022-11-16 NOTE — Assessment & Plan Note (Signed)
Managed by urology, doing well

## 2022-11-17 LAB — LIPID PANEL
Cholesterol: 194 mg/dL (ref <200)
HDL: 64 mg/dL (ref >=40)
LDL Cholesterol (Calc): 116 mg/dL — ABNORMAL HIGH
Non-HDL Cholesterol (Calc): 130 mg/dL — ABNORMAL HIGH (ref <130)
Total CHOL/HDL Ratio: 3 (calc) (ref <5.0)
Triglycerides: 57 mg/dL (ref <150)

## 2022-11-17 LAB — CBC WITH DIFFERENTIAL/PLATELET
Absolute Lymphocytes: 1221 {cells}/uL (ref 850–3900)
Absolute Monocytes: 482 {cells}/uL (ref 200–950)
Basophils Absolute: 90 {cells}/uL (ref 0–200)
Basophils Relative: 1.6 %
Eosinophils Absolute: 62 {cells}/uL (ref 15–500)
Eosinophils Relative: 1.1 %
HCT: 43.9 % (ref 38.5–50.0)
Hemoglobin: 13.8 g/dL (ref 13.2–17.1)
MCH: 26.1 pg — ABNORMAL LOW (ref 27.0–33.0)
MCHC: 31.4 g/dL — ABNORMAL LOW (ref 32.0–36.0)
MCV: 83.1 fL (ref 80.0–100.0)
MPV: 11.3 fL (ref 7.5–12.5)
Monocytes Relative: 8.6 %
Neutro Abs: 3746 {cells}/uL (ref 1500–7800)
Neutrophils Relative %: 66.9 %
Platelets: 211 10*3/uL (ref 140–400)
RBC: 5.28 10*6/uL (ref 4.20–5.80)
RDW: 13.6 % (ref 11.0–15.0)
Total Lymphocyte: 21.8 %
WBC: 5.6 10*3/uL (ref 3.8–10.8)

## 2022-11-17 LAB — HEMOGLOBIN A1C
Hgb A1c MFr Bld: 6.3 %{Hb} — ABNORMAL HIGH (ref <5.7)
Mean Plasma Glucose: 134 mg/dL
eAG (mmol/L): 7.4 mmol/L

## 2022-11-17 LAB — COMPLETE METABOLIC PANEL WITH GFR
AG Ratio: 1.6 (calc) (ref 1.0–2.5)
ALT: 24 U/L (ref 9–46)
AST: 30 U/L (ref 10–35)
Albumin: 4.2 g/dL (ref 3.6–5.1)
Alkaline phosphatase (APISO): 59 U/L (ref 35–144)
BUN: 21 mg/dL (ref 7–25)
CO2: 28 mmol/L (ref 20–32)
Calcium: 9.6 mg/dL (ref 8.6–10.3)
Chloride: 105 mmol/L (ref 98–110)
Creat: 1.27 mg/dL (ref 0.70–1.35)
Globulin: 2.6 g/dL (ref 1.9–3.7)
Glucose, Bld: 116 mg/dL — ABNORMAL HIGH (ref 65–99)
Potassium: 5.2 mmol/L (ref 3.5–5.3)
Sodium: 140 mmol/L (ref 135–146)
Total Bilirubin: 0.3 mg/dL (ref 0.2–1.2)
Total Protein: 6.8 g/dL (ref 6.1–8.1)
eGFR: 61 mL/min/{1.73_m2} (ref >=60)

## 2022-11-17 LAB — MICROALBUMIN / CREATININE URINE RATIO
Creatinine, Urine: 121 mg/dL (ref 20–320)
Microalb Creat Ratio: 21 mg/g{creat} (ref <30)
Microalb, Ur: 2.5 mg/dL

## 2023-02-10 ENCOUNTER — Ambulatory Visit: Payer: PPO

## 2023-02-10 DIAGNOSIS — Z0000 Encounter for general adult medical examination without abnormal findings: Secondary | ICD-10-CM

## 2023-02-10 NOTE — Patient Instructions (Addendum)
 [name] , Thank you for taking time to come for your Medicare Wellness Visit. I appreciate your ongoing commitment to your health goals. Please review the following plan we discussed and let me know if I can assist you in the future.   Referrals/Orders/Follow-Ups/Clinician Recommendations: NONE  This is a list of the screening recommended for you and due dates:  Health Maintenance  Topic Date Due   COVID-19 Vaccine (7 - 2024-25 season) 12/03/2022   Eye exam for diabetics  03/26/2023   Complete foot exam   05/13/2023   Hemoglobin A1C  05/16/2023   Yearly kidney function blood test for diabetes  11/16/2023   Yearly kidney health urinalysis for diabetes  11/16/2023   Colon Cancer Screening  12/26/2023   Medicare Annual Wellness Visit  02/10/2024   Pneumonia Vaccine  Completed   Flu Shot  Completed   Hepatitis C Screening  Completed   Zoster (Shingles) Vaccine  Completed   HPV Vaccine  Aged Out   DTaP/Tdap/Td vaccine  Discontinued    Advanced directives: (ACP Link)Information on Advanced Care Planning can be found at [place]  Secretary of State Advance Health Care Directives Advance Health Care Directives ([email or web address]   Next Medicare Annual Wellness Visit scheduled for next year: Yes   02/16/24 @ 8:10 AM BY PHONE

## 2023-02-10 NOTE — Progress Notes (Signed)
 Subjective:   John Benson. is a 73 y.o. male who presents for Medicare Annual/Subsequent preventive examination.  Visit Complete: Virtual I connected with  John Benson. on 02/10/23 by a audio enabled telemedicine application and verified that I am speaking with the correct person using two identifiers.  This patient declined Interactive audio and acupuncturist. Therefore the visit was completed with audio only.   Patient Location: Home  Provider Location: Office/Clinic  I discussed the limitations of evaluation and management by telemedicine. The patient expressed understanding and agreed to proceed.  Vital Signs: Because this visit was a virtual/telehealth visit, some criteria may be missing or patient reported. Any vitals not documented were not able to be obtained and vitals that have been documented are patient reported.  Cardiac Risk Factors include: advanced age (>47men, >51 women);dyslipidemia;hypertension;male gender     Objective:    There were no vitals filed for this visit. There is no height or weight on file to calculate BMI.     02/10/2023    8:05 AM 08/05/2022    9:18 AM 06/04/2022    8:19 AM 06/04/2021    8:22 AM 05/29/2020    8:48 AM 05/29/2019    8:59 AM 05/03/2019   12:45 PM  Advanced Directives  Does Patient Have a Medical Advance Directive? No No No No No No No  Would patient like information on creating a medical advance directive? No - Patient declined No - Patient declined  Yes (MAU/Ambulatory/Procedural Areas - Information given) Yes (MAU/Ambulatory/Procedural Areas - Information given) Yes (MAU/Ambulatory/Procedural Areas - Information given) No - Patient declined    Current Medications (verified) Outpatient Encounter Medications as of 02/10/2023  Medication Sig   BAYER ASPIRIN EC LOW DOSE 81 MG EC tablet Take 81 mg by mouth daily.    COLACE 100 MG capsule Take 100 mg by mouth daily.    Multiple Vitamin (MULTIVITAMIN) LIQD Take 10 mLs  by mouth daily.   Omega-3 Fatty Acids (OMEGA-3 FISH OIL PO) Take 1 capsule by mouth daily.   sildenafil  (VIAGRA ) 100 MG tablet Take 1 tablet (100 mg total) by mouth daily as needed for erectile dysfunction.   No facility-administered encounter medications on file as of 02/10/2023.    Allergies (verified) Testosterone    History: Past Medical History:  Diagnosis Date   Acute kidney failure ([name]) 08/20/2018   Benign essential HTN    Calculus of kidney    Cancer ([name])    Carpal tunnel syndrome    Carpal tunnel syndrome 07/15/2014   Chronic kidney disease, stage II (mild)    Diabetes mellitus without complication ([name])    controlled by diet and exercise   History of kidney stones    HTN (hypertension)    maintained by diet and exercise   Hyperlipidemia    Hypogonadism in male    Obesity    Prostate cancer [facility])    Past Surgical History:  Procedure Laterality Date   COLONOSCOPY     LITHOTRIPSY     PELVIC LYMPH NODE DISSECTION Bilateral 08/18/2018   Procedure: PELVIC LYMPH NODE DISSECTION;  Surgeon: Francisca Redell BROCKS, MD;  Location: [facility] ORS;  Service: Urology;  Laterality: Bilateral;   ROBOT ASSISTED LAPAROSCOPIC RADICAL PROSTATECTOMY N/A 08/18/2018   Procedure: XI ROBOTIC ASSISTED LAPAROSCOPIC RADICAL PROSTATECTOMY;  Surgeon: Francisca Redell BROCKS, MD;  Location: [facility] ORS;  Service: Urology;  Laterality: N/A;   Family History  Problem Relation Age of Onset   Diabetes Mother    Cancer  Mother        breast   Vision loss Mother    Hypertension Father    Dementia Father    Diabetes Brother    Early death Brother    Pancreatic disease Brother    Early death Sister    Kidney disease Sister    Kidney cancer Neg Hx    Prostate cancer Neg Hx    Social History   Socioeconomic History   Marital status: Married    Spouse name: Not on file   Number of children: Not on file   Years of education: Not on file   Highest education level: Bachelor's degree (e.g., BA, AB, BS)  Occupational  History   Not on file  Tobacco Use   Smoking status: Never    Passive exposure: Never   Smokeless tobacco: Never  Vaping Use   Vaping status: Never Used  Substance and Sexual Activity   Alcohol use: No   Drug use: No   Sexual activity: Yes  Other Topics Concern   Not on file  Social History Narrative   Not on file   Social Drivers of Health   Financial Resource Strain: Low Risk  (02/10/2023)   Overall Financial Resource Strain (CARDIA)    Difficulty of Paying Living Expenses: Not hard at all  Food Insecurity: No Food Insecurity (02/10/2023)   Hunger Vital Sign    Worried About Running Out of Food in the Last Year: Never true    Ran Out of Food in the Last Year: Never true  Transportation Needs: No Transportation Needs (02/10/2023)   PRAPARE - Administrator, Civil Service (Medical): No    Lack of Transportation (Non-Medical): No  Physical Activity: Sufficiently Active (02/10/2023)   Exercise Vital Sign    Days of Exercise per Week: 7 days    Minutes of Exercise per Session: 60 min  Stress: No Stress Concern Present (02/10/2023)   [employer] of Occupational Health - Occupational Stress Questionnaire    Feeling of Stress : Only a little  Social Connections: Socially Integrated (02/10/2023)   Social Connection and Isolation Panel [NHANES]    Frequency of Communication with Friends and Family: More than three times a week    Frequency of Social Gatherings with Friends and Family: Once a week    Attends Religious Services: More than 4 times per year    Active Member of [employer] or Organizations: Yes    Attends Engineer, Structural: More than 4 times per year    Marital Status: Married    Tobacco Counseling Counseling given: Not Answered   Clinical Intake:  Pre-visit preparation completed: Yes  Pain : No/denies pain     BMI - recorded: 28.8 Nutritional Status: BMI 25 -29 Overweight Nutritional Risks: None Diabetes: No  How often do you need to  have someone help you when you read instructions, pamphlets, or other written materials from your doctor or pharmacy?: 1 - Never  Interpreter Needed?: No  Information entered by :: JHONNIE DAS, LPN   Activities of Daily Living    02/10/2023    8:06 AM 02/10/2023    7:16 AM  In your present state of health, do you have any difficulty performing the following activities:  Hearing? 0 0  Vision? 0 0  Difficulty concentrating or making decisions? 0 0  Walking or climbing stairs? 0 0  Dressing or bathing? 0 0  Doing errands, shopping? 0 0  Preparing Food and eating ? N  N  Using the Toilet? N N  In the past six months, have you accidently leaked urine? N N  Do you have problems with loss of bowel control? N N  Managing your Medications? N N  Managing your Finances? N N  Housekeeping or managing your Housekeeping? N N    Patient Care Team: Gareth Mliss FALCON, FNP as PCP - General (Nurse Practitioner) Lenn Aran, MD as Radiation Oncologist (Radiation Oncology) Francisca Redell BROCKS, MD as Consulting Physician (Urology) [facility])  Indicate any recent Medical Services you may have received from other than [facility] providers in the past year (date may be approximate).     Assessment:   This is a routine wellness examination for John.  Hearing/Vision screen Hearing Screening - Comments:: NO AIDS Vision Screening - Comments:: RX READERS-  EYE   Goals Addressed             This Visit's Progress    DIET - EAT MORE FRUITS AND VEGETABLES         Depression Screen    02/10/2023    8:04 AM 11/16/2022    7:35 AM 08/16/2022    9:49 AM 06/04/2022    8:18 AM 05/13/2022    8:04 AM 01/11/2022    8:28 AM 09/11/2021    8:15 AM  PHQ 2/9 Scores  PHQ - 2 Score 0 0 0 0 0 0 0  PHQ- 9 Score 0  0        Fall Risk    02/10/2023    8:06 AM 02/10/2023    7:16 AM 11/16/2022    7:35 AM 08/16/2022    9:49 AM 06/04/2022    8:09 AM  Fall Risk   Falls in the past year? 0 1  0 0 0  Number falls in past yr: 0 0 0 0 0  Injury with Fall? 0 0 0 0 0  Risk for fall due to : No Fall Risks  No Fall Risks  No Fall Risks  Follow up Falls prevention discussed;Falls evaluation completed  Falls prevention discussed  Education provided    MEDICARE RISK AT HOME: Medicare Risk at Home Any stairs in or around the home?: No If so, are there any without handrails?: No Home free of loose throw rugs in walkways, pet beds, electrical cords, etc?: Yes Adequate lighting in your home to reduce risk of falls?: Yes Life alert?: No Use of a cane, walker or w/c?: No Grab bars in the bathroom?: Yes Shower chair or bench in shower?: No Elevated toilet seat or a handicapped toilet?: No  TIMED UP AND GO:  Was the test performed?  No    Cognitive Function:        02/10/2023    8:07 AM 06/04/2022    8:22 AM 05/25/2018    1:40 PM  6CIT Screen  What Year? 0 points 0 points 0 points  What month? 0 points 0 points 0 points  What time? 0 points 0 points 0 points  Count back from 20 0 points 0 points 0 points  Months in reverse 2 points 0 points 0 points  Repeat phrase 0 points 0 points 2 points  Total Score 2 points 0 points 2 points    Immunizations Immunization History  Administered Date(s) Administered   Fluad Quad(high Dose 65+) 09/11/2021   Influenza, High Dose Seasonal PF 10/08/2018   Influenza, Seasonal, Injecte, Preservative Fre 10/12/2013   Influenza,inj,Quad PF,6+ Mos 10/26/2012, 09/30/2015, 10/22/2016, 09/29/2017  Influenza-Unspecified 09/25/2014, 09/29/2017, 09/29/2019, 09/20/2020, 10/08/2022   Moderna Covid-19 Vaccine Bivalent Booster 8yrs & up 05/23/2021   Moderna Sars-Covid-2 Vaccination 01/11/2019, 02/08/2019, 10/27/2019, 04/12/2020   PNEUMOCOCCAL CONJUGATE-20 05/23/2021   Pneumococcal Conjugate-13 04/30/2013   Pneumococcal Polysaccharide-23 07/21/2010, 02/19/2020   Tdap 07/21/2010   Unspecified SARS-COV-2 Vaccination 10/08/2022   Zoster  Recombinant(Shingrix ) 06/11/2021, 09/11/2021    TDAP status: Due, Education has been provided regarding the importance of this vaccine. Advised may receive this vaccine at local pharmacy or Health Dept. Aware to provide a copy of the vaccination record if obtained from local pharmacy or Health Dept. Verbalized acceptance and understanding.  Flu Vaccine status: Up to date  Pneumococcal vaccine status: Up to date  Covid-19 vaccine status: Completed vaccines  Qualifies for Shingles Vaccine? Yes   Zostavax completed No   Shingrix  Completed?: Yes  Screening Tests Health Maintenance  Topic Date Due   COVID-19 Vaccine (7 - 2024-25 season) 12/03/2022   OPHTHALMOLOGY EXAM  03/26/2023   FOOT EXAM  05/13/2023   HEMOGLOBIN A1C  05/16/2023   Diabetic kidney evaluation - eGFR measurement  11/16/2023   Diabetic kidney evaluation - Urine ACR  11/16/2023   Colonoscopy  12/26/2023   Medicare Annual Wellness (AWV)  02/10/2024   Pneumonia Vaccine 97+ Years old  Completed   INFLUENZA VACCINE  Completed   Hepatitis C Screening  Completed   Zoster Vaccines- Shingrix   Completed   HPV VACCINES  Aged Out   DTaP/Tdap/Td  Discontinued    Health Maintenance  Health Maintenance Due  Topic Date Due   COVID-19 Vaccine (7 - 2024-25 season) 12/03/2022    Colorectal cancer screening: Type of screening: Colonoscopy. Completed 12/25/13. Repeat every 10 years  Lung Cancer Screening: (Low Dose CT Chest recommended if Age 23-80 years, 20 pack-year currently smoking OR have quit w/in 15years.) does not qualify.    Additional Screening:  Hepatitis C Screening: does qualify; Completed 06/22/11  Vision Screening: Recommended annual ophthalmology exams for early detection of glaucoma and other disorders of the eye. Is the patient up to date with their annual eye exam?  Yes  Who is the provider or what is the name of the office in which the patient attends annual eye exams? [facility] If pt is not  established with a provider, would they like to be referred to a provider to establish care? No .   Dental Screening: Recommended annual dental exams for proper oral hygiene  Diabetic Foot Exam: Diabetic Foot Exam: Completed 05/13/22  Community Resource Referral / Chronic Care Management: CRR required this visit?  No   CCM required this visit?  No     Plan:     I have personally reviewed and noted the following in the patient's chart:   Medical and social history Use of alcohol, tobacco or illicit drugs  Current medications and supplements including opioid prescriptions. Patient is not currently taking opioid prescriptions. Functional ability and status Nutritional status Physical activity Advanced directives List of other physicians Hospitalizations, surgeries, and ER visits in previous 12 months Vitals Screenings to include cognitive, depression, and falls Referrals and appointments  In addition, I have reviewed and discussed with patient certain preventive protocols, quality metrics, and best practice recommendations. A written personalized care plan for preventive services as well as general preventive health recommendations were provided to patient.     Jhonnie GORMAN Das, LPN   07/06/7972   After Visit Summary: (MyChart) Due to this being a telephonic visit, the after visit summary with patients  personalized plan was offered to patient via MyChart   Nurse Notes: NONE

## 2023-03-28 LAB — HM DIABETES EYE EXAM

## 2023-05-16 ENCOUNTER — Other Ambulatory Visit: Payer: Self-pay

## 2023-05-16 ENCOUNTER — Ambulatory Visit: Payer: Self-pay | Admitting: Nurse Practitioner

## 2023-05-16 ENCOUNTER — Encounter: Payer: Self-pay | Admitting: Nurse Practitioner

## 2023-05-16 VITALS — BP 128/76 | HR 65 | Temp 98.4°F | Resp 16 | Ht 69.5 in | Wt 203.6 lb

## 2023-05-16 DIAGNOSIS — N182 Chronic kidney disease, stage 2 (mild): Secondary | ICD-10-CM

## 2023-05-16 DIAGNOSIS — E1165 Type 2 diabetes mellitus with hyperglycemia: Secondary | ICD-10-CM | POA: Diagnosis not present

## 2023-05-16 DIAGNOSIS — C61 Malignant neoplasm of prostate: Secondary | ICD-10-CM | POA: Diagnosis not present

## 2023-05-16 DIAGNOSIS — N5235 Erectile dysfunction following radiation therapy: Secondary | ICD-10-CM | POA: Diagnosis not present

## 2023-05-16 DIAGNOSIS — E66811 Obesity, class 1: Secondary | ICD-10-CM

## 2023-05-16 DIAGNOSIS — N1831 Chronic kidney disease, stage 3a: Secondary | ICD-10-CM | POA: Insufficient documentation

## 2023-05-16 DIAGNOSIS — E782 Mixed hyperlipidemia: Secondary | ICD-10-CM

## 2023-05-16 DIAGNOSIS — I10 Essential (primary) hypertension: Secondary | ICD-10-CM

## 2023-05-16 NOTE — Progress Notes (Signed)
 BP 128/76 (Cuff Size: Large)   Pulse 65   Temp 98.4 F (36.9 C) (Oral)   Resp 16   Ht 5' 9.5" (1.765 m)   Wt 203 lb 9.6 oz (92.4 kg)   SpO2 99%   BMI 29.64 kg/m    Subjective:    Patient ID: John Cogan., male    DOB: [birth year], 73 y.o.   MRN: [id number]  HPI: John Benson is a 73 y.o. male  Chief Complaint  Patient presents with   Medical Management of Chronic Issues    6 month recheck    Discussed the use of AI scribe software for clinical note transcription with the patient, who gave verbal consent to proceed.  History of Present Illness John Benson. is a 73 year old male who presents for a six month follow-up.  He has hypertension with a recent blood pressure reading of 128/76 mmHg and no issues with blood pressure management at home.  He has type two diabetes and is actively working on diet and exercise. His last A1c was 6.3%, indicating good control.  He has chronic kidney disease with a last GFR of 61, indicating stage 2 CKD.  He has a history of prostate cancer, which is managed by urology, with an upcoming appointment in the next few months.  He has mixed hyperlipidemia and is focusing on lifestyle modifications. His last LDL was 116 mg/dL.  He has erectile dysfunction and takes sildenafil  100 mg as needed.  No new symptoms or issues since his last visit. His mood has been good, with no depression or anxiety. No problems with his feet and he confirms regular foot checks.         02/10/2023    8:04 AM 11/16/2022    7:35 AM 08/16/2022    9:49 AM  Depression screen PHQ 2/9  Decreased Interest 0 0 0  Down, Depressed, Hopeless 0 0 0  PHQ - 2 Score 0 0 0  Altered sleeping 0  0  Tired, decreased energy 0  0  Change in appetite 0  0  Feeling bad or failure about yourself  0  0  Trouble concentrating 0  0  Moving slowly or fidgety/restless 0  0  Suicidal thoughts 0  0  PHQ-9 Score 0  0  Difficult doing work/chores Not difficult at all   Not difficult at all    Relevant past medical, surgical, family and social history reviewed and updated as indicated. Interim medical history since our last visit reviewed. Allergies and medications reviewed and updated.  Review of Systems  Constitutional: Negative for fever or weight change.  Respiratory: Negative for cough and shortness of breath.   Cardiovascular: Negative for chest pain or palpitations.  Gastrointestinal: Negative for abdominal pain, no bowel changes.  Musculoskeletal: Negative for gait problem or joint swelling.  Skin: Negative for rash.  Neurological: Negative for dizziness or headache.  No other specific complaints in a complete review of systems (except as listed in HPI above).      Objective:      BP 128/76 (Cuff Size: Large)   Pulse 65   Temp 98.4 F (36.9 C) (Oral)   Resp 16   Ht 5' 9.5" (1.765 m)   Wt 203 lb 9.6 oz (92.4 kg)   SpO2 99%   BMI 29.64 kg/m    Wt Readings from Last 3 Encounters:  05/16/23 203 lb 9.6 oz (92.4 kg)  11/16/22 198 lb 3.2 oz (  89.9 kg)  09/02/22 202 lb (91.6 kg)    Physical Exam Vitals reviewed.  Constitutional:      Appearance: Normal appearance.  HENT:     Head: Normocephalic.  Cardiovascular:     Rate and Rhythm: Normal rate and regular rhythm.  Pulmonary:     Effort: Pulmonary effort is normal.     Breath sounds: Normal breath sounds.  Musculoskeletal:        General: Normal range of motion.  Skin:    General: Skin is warm and dry.  Neurological:     General: No focal deficit present.     Mental Status: He is alert and oriented to person, place, and time. Mental status is at baseline.  Psychiatric:        Mood and Affect: Mood normal.        Behavior: Behavior normal.        Thought Content: Thought content normal.        Judgment: Judgment normal.       Results for orders placed or performed in visit on 11/16/22  Microalbumin / creatinine urine ratio   Collection Time: 11/16/22  8:04 AM  Result  Value Ref Range   Creatinine, Urine 121 20 - 320 mg/dL   Microalb, Ur 2.5 mg/dL   Microalb Creat Ratio 21 <30 mg/g creat  Lipid panel   Collection Time: 11/16/22  8:04 AM  Result Value Ref Range   Cholesterol 194 <200 mg/dL   HDL 64 > OR = 40 mg/dL   Triglycerides 57 <657 mg/dL   LDL Cholesterol (Calc) 116 (H) mg/dL (calc)   Total CHOL/HDL Ratio 3.0 <5.0 (calc)   Non-HDL Cholesterol (Calc) 130 (H) <130 mg/dL (calc)  CBC with Differential/Platelet   Collection Time: 11/16/22  8:04 AM  Result Value Ref Range   WBC 5.6 3.8 - 10.8 Thousand/uL   RBC 5.28 4.20 - 5.80 Million/uL   Hemoglobin 13.8 13.2 - 17.1 g/dL   HCT 84.6 96.2 - 95.2 %   MCV 83.1 80.0 - 100.0 fL   MCH 26.1 (L) 27.0 - 33.0 pg   MCHC 31.4 (L) 32.0 - 36.0 g/dL   RDW 84.1 32.4 - 40.1 %   Platelets 211 140 - 400 Thousand/uL   MPV 11.3 7.5 - 12.5 fL   Neutro Abs 3,746 1,500 - 7,800 cells/uL   Absolute Lymphocytes 1,221 850 - 3,900 cells/uL   Absolute Monocytes 482 200 - 950 cells/uL   Eosinophils Absolute 62 15 - 500 cells/uL   Basophils Absolute 90 0 - 200 cells/uL   Neutrophils Relative % 66.9 %   Total Lymphocyte 21.8 %   Monocytes Relative 8.6 %   Eosinophils Relative 1.1 %   Basophils Relative 1.6 %  COMPLETE METABOLIC PANEL WITH GFR   Collection Time: 11/16/22  8:04 AM  Result Value Ref Range   Glucose, Bld 116 (H) 65 - 99 mg/dL   BUN 21 7 - 25 mg/dL   Creat 0.27 2.53 - 6.64 mg/dL   eGFR 61 > OR = 60 QI/HKV/4.25Z5   BUN/Creatinine Ratio SEE NOTE: 6 - 22 (calc)   Sodium 140 135 - 146 mmol/L   Potassium 5.2 3.5 - 5.3 mmol/L   Chloride 105 98 - 110 mmol/L   CO2 28 20 - 32 mmol/L   Calcium  9.6 8.6 - 10.3 mg/dL   Total Protein 6.8 6.1 - 8.1 g/dL   Albumin 4.2 3.6 - 5.1 g/dL   Globulin 2.6 1.9 - 3.7 g/dL (calc)  AG Ratio 1.6 1.0 - 2.5 (calc)   Total Bilirubin 0.3 0.2 - 1.2 mg/dL   Alkaline phosphatase (APISO) 59 35 - 144 U/L   AST 30 10 - 35 U/L   ALT 24 9 - 46 U/L  Hemoglobin A1c   Collection Time:  11/16/22  8:04 AM  Result Value Ref Range   Hgb A1c MFr Bld 6.3 (H) <5.7 % of total Hgb   Mean Plasma Glucose 134 mg/dL   eAG (mmol/L) 7.4 mmol/L          Assessment & Plan:   Problem List Items Addressed This Visit       Cardiovascular and Mediastinum   Essential hypertension - Primary   Relevant Orders   CBC with Differential/Platelet   Comprehensive metabolic panel with GFR     Endocrine   Type 2 diabetes mellitus with hyperglycemia, without long-term current use of insulin ([name])   Relevant Orders   Comprehensive metabolic panel with GFR   Hemoglobin A1c   HM Diabetes Foot Exam (Completed)     Genitourinary   Prostate cancer ([name])   Relevant Orders   PSA   Stage 3a chronic kidney disease ([name])     Other   Mixed hyperlipidemia   Relevant Orders   Lipid panel   Obesity (BMI 30.0-34.9)   Erectile dysfunction     Assessment and Plan Assessment & Plan Wellness Visit Routine six-month follow-up with no new complaints. Blood pressure is well-controlled at 128/76 mmHg. Mood is stable without depression or anxiety. No issues with feet or vision. Continues urology follow-up for prostate cancer management. Emphasized 80/20 dietary approach for balance between healthy eating and enjoyment. - Perform blood tests to assess current health status - Review blood test results and communicate findings  Type 2 diabetes mellitus Diabetes is managed with diet and exercise. Last HbA1c was 6.3%, indicating good control. No issues with feet.  Hypertension Blood pressure is well-controlled at 128/76 mmHg. No issues at home.  Mixed hyperlipidemia Engaged in lifestyle modifications for hyperlipidemia. Last LDL was 116 mg/dL.  Chronic kidney disease Chronic kidney disease is well-managed. Last GFR was 61 mL/min/1.73 m.  History of Prostate cancer/erectile dysfunction Prostate cancer is managed by urology with an upcoming appointment scheduled.        Follow up plan: Return  in about 6 months (around 11/16/2023) for follow up.

## 2023-05-17 ENCOUNTER — Ambulatory Visit: Payer: Self-pay | Admitting: Nurse Practitioner

## 2023-05-17 LAB — CBC WITH DIFFERENTIAL/PLATELET
Absolute Lymphocytes: 1175 {cells}/uL (ref 850–3900)
Absolute Monocytes: 567 {cells}/uL (ref 200–950)
Basophils Absolute: 57 {cells}/uL (ref 0–200)
Basophils Relative: 1.1 %
Eosinophils Absolute: 88 {cells}/uL (ref 15–500)
Eosinophils Relative: 1.7 %
HCT: 43.5 % (ref 38.5–50.0)
Hemoglobin: 14.1 g/dL (ref 13.2–17.1)
MCH: 26.4 pg — ABNORMAL LOW (ref 27.0–33.0)
MCHC: 32.4 g/dL (ref 32.0–36.0)
MCV: 81.3 fL (ref 80.0–100.0)
MPV: 10.8 fL (ref 7.5–12.5)
Monocytes Relative: 10.9 %
Neutro Abs: 3312 {cells}/uL (ref 1500–7800)
Neutrophils Relative %: 63.7 %
Platelets: 221 10*3/uL (ref 140–400)
RBC: 5.35 10*6/uL (ref 4.20–5.80)
RDW: 13.3 % (ref 11.0–15.0)
Total Lymphocyte: 22.6 %
WBC: 5.2 10*3/uL (ref 3.8–10.8)

## 2023-05-17 LAB — COMPREHENSIVE METABOLIC PANEL WITH GFR
AG Ratio: 1.8 (calc) (ref 1.0–2.5)
ALT: 22 U/L (ref 9–46)
AST: 31 U/L (ref 10–35)
Albumin: 4.3 g/dL (ref 3.6–5.1)
Alkaline phosphatase (APISO): 51 U/L (ref 35–144)
BUN: 19 mg/dL (ref 7–25)
CO2: 31 mmol/L (ref 20–32)
Calcium: 9.5 mg/dL (ref 8.6–10.3)
Chloride: 104 mmol/L (ref 98–110)
Creat: 1.22 mg/dL (ref 0.70–1.28)
Globulin: 2.4 g/dL (ref 1.9–3.7)
Glucose, Bld: 134 mg/dL — ABNORMAL HIGH (ref 65–99)
Potassium: 5.2 mmol/L (ref 3.5–5.3)
Sodium: 139 mmol/L (ref 135–146)
Total Bilirubin: 0.5 mg/dL (ref 0.2–1.2)
Total Protein: 6.7 g/dL (ref 6.1–8.1)
eGFR: 64 mL/min/{1.73_m2} (ref >=60)

## 2023-05-17 LAB — HEMOGLOBIN A1C
Hgb A1c MFr Bld: 6.4 % — ABNORMAL HIGH (ref <5.7)
Mean Plasma Glucose: 137 mg/dL
eAG (mmol/L): 7.6 mmol/L

## 2023-05-17 LAB — LIPID PANEL
Cholesterol: 209 mg/dL — ABNORMAL HIGH (ref <200)
HDL: 69 mg/dL (ref >=40)
LDL Cholesterol (Calc): 126 mg/dL — ABNORMAL HIGH
Non-HDL Cholesterol (Calc): 140 mg/dL — ABNORMAL HIGH (ref <130)
Total CHOL/HDL Ratio: 3 (calc) (ref <5.0)
Triglycerides: 47 mg/dL (ref <150)

## 2023-05-17 LAB — PSA: PSA: 0.06 ng/mL (ref <=4.00)

## 2023-07-20 ENCOUNTER — Encounter: Payer: Self-pay | Admitting: Urology

## 2023-08-29 ENCOUNTER — Other Ambulatory Visit: Payer: Self-pay

## 2023-08-29 DIAGNOSIS — C61 Malignant neoplasm of prostate: Secondary | ICD-10-CM | POA: Diagnosis not present

## 2023-08-30 LAB — PSA: Prostate Specific Ag, Serum: <0.1 ng/mL (ref 0.0–4.0)

## 2023-08-31 ENCOUNTER — Ambulatory Visit (INDEPENDENT_AMBULATORY_CARE_PROVIDER_SITE_OTHER): Admitting: Urology

## 2023-08-31 VITALS — BP 178/79 | HR 58 | Ht 69.5 in | Wt 210.0 lb

## 2023-08-31 DIAGNOSIS — C61 Malignant neoplasm of prostate: Secondary | ICD-10-CM

## 2023-08-31 DIAGNOSIS — N529 Male erectile dysfunction, unspecified: Secondary | ICD-10-CM

## 2023-08-31 MED ORDER — SILDENAFIL CITRATE 100 MG PO TABS: 100.0000 mg | ORAL_TABLET | Freq: Every day | ORAL | 6 refills | Status: AC | PRN

## 2023-08-31 NOTE — Progress Notes (Signed)
   08/31/2023 8:46 AM   Elsie VEAR Kirt Mickey. 07-10-1950 [id number]  Reason for visit: Follow up high risk prostate cancer, ED, stress incontinence  HPI: He is a 73 year old African-American male who had a long history of significantly elevated PSA before finally following up with urology and undergoing a prostate biopsy on 05/18/2018 for an elevated PSA of 20.  This showed high risk prostate cancer, and staging imaging showed no evidence of metastatic disease. He underwent a robotic prostatectomy and bilateral lymphadenectomy on 08/18/2018 with final pathology showing no lymph node involvement, however Gleason score 4+5=9 stage pT3a prostate adenocarcinoma with extraprostatic extension, lymphovascular invasion, perineural invasion, and positive margins at the left apex, and bilaterally.  Carcinoma was present within the vascular bundles outside the muscular layer of the right SV.   His initial post-op PSA in October 2020 was undetectable, however PSA in January 2021 was 0.2, and confirmed at 0.2 on repeat later that week.  He underwent salvage radiation with Dr. Lenn that was completed in March 2021.    Post radiation PSA was undetectable, and remains undetectable on most recent PSA dated 08/29/23.     At baseline prior to surgery he had moderate to severe ED that was only minimally improved with PDE5 inhibitors.  He has previously tried Cialis  postoperatively with no improvement in ED, as well as penile injections with Clotilda Cornwall, PA.  He had significant pain with the penile injections and is not interested in pursuing this further.   He has mild improvement with sildenafil , but is interested in other options.  I also discussed penile prosthesis and offered referral to Dr. Lovie in [place] to discuss further, he is undecided at this time but will let us  know if he would like a referral.  He is doing well from a urination perspective.  He has mild leakage, and wears a pad with physical  activity.  He has urgency in the morning when he drinks coffee, but this is not bothersome enough to consider medications.  We discussed trial of a Cunningham clamp if he has worsening of his stress incontinence.    We discussed the risk of recurrence with his high risk disease, and the need for close PSA monitoring.  Continue yearly PSA monitoring.   Sildenafil  100 mg as needed refilled Contact information for Dr. Lovie provided [id number] implant RTC 1 year PSA prior(PA)   Redell JAYSON Burnet, MD  [facility] [address] [place], [place] [address] [phone]

## 2023-08-31 NOTE — Patient Instructions (Addendum)
 Dr Lovie is a urologist in [place] who performs penile implants.  His phone number is [phone], and website is [email or web address].  You can call and make an appointment if you are interested in learning more about penile implants for erectile dysfunction after prostate cancer surgery.   Nocturia refers to the need to wake up during the night to urinate, which can disrupt your sleep and impact your overall well-being. Fortunately, there are several strategies you can employ to help prevent or manage nocturia. It's important to consult with your healthcare provider before making any significant changes to your routine. Here are some helpful strategies to consider:  Limit Fluid Intake Before Bed: Avoid drinking large amounts of fluids in the evening, especially within a few hours of bedtime. Consume most of your daily fluid intake earlier in the day to reduce the need to urinate at night.  Monitor Your Diet: Limit your intake of caffeine and alcohol, as these substances can increase urine production and irritate the bladder.  Avoid diet, zero calorie, and artificially sweetened drinks, especially sodas, in the afternoon or evening. Be mindful of consuming foods and drinks with high water content before bedtime, such as watermelon and herbal teas.  Time Your Medications: If you're taking medications that contribute to increased urination, consult your healthcare provider about adjusting the timing of these medications to minimize their impact during the night.  Practice Double Voiding: Before going to bed, make an effort to empty your bladder twice within a short period. This can help reduce the amount of urine left in your bladder before sleep.  Bladder Training: Gradually increase the time between bathroom visits during the day to train your bladder to hold larger volumes of urine. Over time, this can help reduce the frequency of nighttime awakenings to urinate.  Elevate Your  Legs During the Day: Elevating your legs during the day can help minimize fluid retention in your lower extremities, which might reduce nighttime urination.  Pelvic Floor Exercises: Strengthening your pelvic floor muscles through Kegel exercises can help improve bladder control and potentially reduce the urge to urinate at night.  Create a Relaxing Bedtime Routine: Stress and anxiety can exacerbate nocturia. Engage in calming activities before bed, such as reading, listening to soothing music, or practicing relaxation techniques.  Stay Active: Engage in regular physical activity, but avoid intense exercise close to bedtime, as this can increase your body's demand for fluids.  Maintain a Healthy Weight: Excess weight can compress the bladder and contribute to bladder and urinary issues. Aim to achieve and maintain a healthy weight through a balanced diet and regular exercise.  Remember that every individual is unique, and the effectiveness of these strategies may vary. It's important to work with your healthcare provider to develop a plan that suits your specific needs and addresses any underlying causes of nocturia.

## 2023-09-01 ENCOUNTER — Ambulatory Visit: Payer: Self-pay | Admitting: Urology

## 2023-11-02 NOTE — Progress Notes (Signed)
 John Benson Kirt Mickey.                                          MRN: [id number]   11/02/2023   The [facility] Quality Team Specialist reviewed this patient medical record for the purposes of chart review for care gap closure. The following were reviewed: chart review for care gap closure-controlling blood pressure.    [facility]BCI Quality Team

## 2023-11-15 NOTE — Progress Notes (Signed)
 Pharmacy Quality Measure Review  This patient is appearing on a report for being at risk of failing the  Kidney Health Evaluation for Patients with Diabetes measure this calendar year.   Last documented A1c on 05/16/23  Last documented UACR on 11/16/22  Appointment with PCP scheduled on 11/16/23. Will route to PCP to obtain updated UACR at follow up appointment.   Craig Wisnewski E. Marsh, PharmD, CPP Clinical Pharmacist [facility] [phone]

## 2023-11-16 ENCOUNTER — Ambulatory Visit: Admitting: Nurse Practitioner

## 2023-11-16 ENCOUNTER — Other Ambulatory Visit: Payer: Self-pay

## 2023-11-16 ENCOUNTER — Encounter: Payer: Self-pay | Admitting: Nurse Practitioner

## 2023-11-16 VITALS — BP 124/76 | HR 69 | Temp 97.9°F | Resp 16 | Ht 69.5 in | Wt 202.7 lb

## 2023-11-16 DIAGNOSIS — Z1211 Encounter for screening for malignant neoplasm of colon: Secondary | ICD-10-CM | POA: Diagnosis not present

## 2023-11-16 DIAGNOSIS — E782 Mixed hyperlipidemia: Secondary | ICD-10-CM | POA: Diagnosis not present

## 2023-11-16 DIAGNOSIS — C61 Malignant neoplasm of prostate: Secondary | ICD-10-CM | POA: Diagnosis not present

## 2023-11-16 DIAGNOSIS — E1165 Type 2 diabetes mellitus with hyperglycemia: Secondary | ICD-10-CM

## 2023-11-16 DIAGNOSIS — N1831 Chronic kidney disease, stage 3a: Secondary | ICD-10-CM | POA: Diagnosis not present

## 2023-11-16 DIAGNOSIS — I10 Essential (primary) hypertension: Secondary | ICD-10-CM | POA: Diagnosis not present

## 2023-11-16 DIAGNOSIS — N5235 Erectile dysfunction following radiation therapy: Secondary | ICD-10-CM

## 2023-11-16 DIAGNOSIS — Z8639 Personal history of other endocrine, nutritional and metabolic disease: Secondary | ICD-10-CM

## 2023-11-16 LAB — POCT GLYCOSYLATED HEMOGLOBIN (HGB A1C): Hemoglobin A1C: 6.8 % — AB (ref 4.0–5.6)

## 2023-11-16 NOTE — Progress Notes (Signed)
 BP 124/76 (Cuff Size: Large)   Pulse 69   Temp 97.9 F (36.6 C) (Oral)   Resp 16   Ht 5' 9.5 (1.765 m)   Wt 202 lb 11.2 oz (91.9 kg)   SpO2 99%   BMI 29.50 kg/m    Subjective:    Patient ID: John Benson., male    DOB: 10-26-1950, 73 y.o.   MRN: [id number]  HPI: [name] is a 73 y.o. male  Chief Complaint  Patient presents with   Medical Management of Chronic Issues    6 month recheck   Discussed the use of AI scribe software for clinical note transcription with the patient, who gave verbal consent to proceed.  History of Present Illness John Benson is a 73 year old male with hypertension, type two diabetes, chronic kidney disease, hyperlipidemia, and obesity who presents for a six month follow-up.  Hypertension - No new symptoms or changes in health status - Monitors blood pressure at home occasionally, especially when checking his wife's readings - No abnormal blood pressure readings reported  Type 2 diabetes mellitus - Most recent hemoglobin A1c is 6.8, increased from previous readings of 6.4 (May 2025), 6.3, and 6.1 - Does not perform home blood glucose monitoring - Manages diet and feels dietary control is adequate  Chronic kidney disease - Recent estimated glomerular filtration rate (GFR) is 64 - Due for microalbumin urine testing  Hyperlipidemia - Recent low-density lipoprotein (LDL) level is 126 - Occasionally consumes unhealthy snacks at work - Generally maintains a healthy diet with fruits and vegetables  Hx of Obesity Wt Readings from Last 3 Encounters:  11/16/23 202 lb 11.2 oz (91.9 kg)  08/31/23 210 lb (95.3 kg)  05/16/23 203 lb 9.6 oz (92.4 kg)   Body mass index is 29.5 kg/m.   Prostate cancer surveillance and erectile dysfunction - History of prostate cancer with normal prostate-specific antigen (PSA) levels - Erectile dysfunction managed with sildenafil  100 mg as needed under urology care         02/10/2023     8:04 AM 11/16/2022    7:35 AM 08/16/2022    9:49 AM  Depression screen PHQ 2/9  Decreased Interest 0 0 0  Down, Depressed, Hopeless 0 0 0  PHQ - 2 Score 0 0 0  Altered sleeping 0  0  Tired, decreased energy 0  0  Change in appetite 0  0  Feeling bad or failure about yourself  0  0  Trouble concentrating 0  0  Moving slowly or fidgety/restless 0  0  Suicidal thoughts 0  0  PHQ-9 Score 0   0   Difficult doing work/chores Not difficult at all  Not difficult at all     Data saved with a previous flowsheet row definition    Relevant past medical, surgical, family and social history reviewed and updated as indicated. Interim medical history since our last visit reviewed. Allergies and medications reviewed and updated.  Review of Systems  Constitutional: Negative for fever or weight change.  Respiratory: Negative for cough and shortness of breath.   Cardiovascular: Negative for chest pain or palpitations.  Gastrointestinal: Negative for abdominal pain, no bowel changes.  Musculoskeletal: Negative for gait problem or joint swelling.  Skin: Negative for rash.  Neurological: Negative for dizziness or headache.  No other specific complaints in a complete review of systems (except as listed in HPI above).      Objective:  BP 124/76 (Cuff Size: Large)   Pulse 69   Temp 97.9 F (36.6 C) (Oral)   Resp 16   Ht 5' 9.5 (1.765 m)   Wt 202 lb 11.2 oz (91.9 kg)   SpO2 99%   BMI 29.50 kg/m    Wt Readings from Last 3 Encounters:  11/16/23 202 lb 11.2 oz (91.9 kg)  08/31/23 210 lb (95.3 kg)  05/16/23 203 lb 9.6 oz (92.4 kg)    Physical Exam VITALS: BP- 124/76 GENERAL: Alert, cooperative, well developed, no acute distress. HEENT: Normocephalic, normal oropharynx, moist mucous membranes. CHEST: Clear to auscultation bilaterally, no wheezes, rhonchi, or crackles. CARDIOVASCULAR: Normal heart rate and rhythm, S1 and S2 normal without murmurs. ABDOMEN: Soft, non-tender,  non-distended, without organomegaly, normal bowel sounds. EXTREMITIES: No cyanosis or edema. NEUROLOGICAL: Cranial nerves grossly intact, moves all extremities without gross motor or sensory deficit.  Results for orders placed or performed in visit on 11/16/23  POCT HgB A1C   Collection Time: 11/16/23  8:07 AM  Result Value Ref Range   Hemoglobin A1C 6.8 (A) 4.0 - 5.6 %   HbA1c POC (<> result, manual entry)     HbA1c, POC (prediabetic range)     HbA1c, POC (controlled diabetic range)            Assessment & Plan:   Problem List Items Addressed This Visit       Cardiovascular and Mediastinum   Essential hypertension - Primary     Endocrine   Type 2 diabetes mellitus with hyperglycemia, without long-term current use of insulin ([name])   Relevant Orders   Microalbumin / creatinine urine ratio   POCT HgB A1C (Completed)     Genitourinary   Prostate cancer ([name])   Stage 3a chronic kidney disease ([name])     Other   Mixed hyperlipidemia   Erectile dysfunction   History of obesity   Other Visit Diagnoses       Screening for colon cancer       Relevant Orders   Ambulatory referral to Gastroenterology        Assessment and Plan Assessment & Plan Type 2 diabetes mellitus A1c increased from 6.4% to 6.8%. No home blood glucose monitoring reported. - Continue to monitor A1c closely to prevent reaching 7%. - Encouraged dietary modifications to improve glycemic control.  Essential hypertension Blood pressure is well-controlled at 124/76 mmHg with no reported issues at home.  Chronic kidney disease, stage 3a, resolved Kidney function is stable with a GFR of 64 mL/min/1.73 m2. - Obtained microalbumin urine test.  Mixed hyperlipidemia LDL cholesterol is elevated at 126 mg/dL. Dietary habits include consumption of high-fat foods at work, potentially contributing to elevated cholesterol levels. - Encouraged dietary modifications to reduce cholesterol intake.  Encounter for  screening for malignant neoplasm of colon Due for colonoscopy in December. Discussed the option to discontinue screening after age 58, considering life expectancy and quality of life. - Placed referral to GI for colonoscopy.  ED/hx of prostate cancer -managed by urology -  Lab Results  Component Value Date   PSA1 <0.1 08/29/2023   PSA1 <0.1 08/25/2021   PSA1 <0.1 01/30/2021   PSA 0.06 05/16/2023   PSA 20.2 (H) 02/02/2018   PSA 19.3 (H) 10/27/2017    Hx of obesity -patient is working on lifestyle modification Filed Weights   11/16/23 0740  Weight: 202 lb 11.2 oz (91.9 kg)   Body mass index is 29.5 kg/m.       Follow up  plan: Return in about 6 months (around 05/15/2024) for follow up.

## 2023-11-17 ENCOUNTER — Telehealth: Payer: Self-pay

## 2023-11-17 ENCOUNTER — Ambulatory Visit: Payer: Self-pay | Admitting: Nurse Practitioner

## 2023-11-17 ENCOUNTER — Other Ambulatory Visit: Payer: Self-pay

## 2023-11-17 DIAGNOSIS — Z1211 Encounter for screening for malignant neoplasm of colon: Secondary | ICD-10-CM

## 2023-11-17 LAB — MICROALBUMIN / CREATININE URINE RATIO
Creatinine, Urine: 59 mg/dL (ref 20–320)
Microalb Creat Ratio: 10 mg/g{creat} (ref <30)
Microalb, Ur: 0.6 mg/dL

## 2023-11-17 MED ORDER — NA SULFATE-K SULFATE-MG SULF 17.5-3.13-1.6 GM/177ML PO SOLN: 1.0000 | Freq: Once | ORAL | 0 refills | Status: AC

## 2023-11-17 NOTE — Progress Notes (Signed)
 Gastroenterology Pre-Procedure Review  Request Date: 01/26/24 Requesting Physician: Dr. Jinny  PATIENT REVIEW QUESTIONS: The patient responded to the following health history questions as indicated:    1. Are you having any GI issues? no 2. Do you have a personal history of Polyps? no 3. Do you have a family history of Colon Cancer or Polyps? no 4. Diabetes Mellitus? YES CONTROLLED WITH DIET 5. Joint replacements in the past 12 months?no 6. Major health problems in the past 3 months?no 7. Any artificial heart valves, MVP, or defibrillator?no    MEDICATIONS & ALLERGIES:    Patient reports the following regarding taking any anticoagulation/antiplatelet therapy:   Plavix, Coumadin, Eliquis, Xarelto, Lovenox, Pradaxa, Brilinta, or Effient? no Aspirin? yes (81 MG DAILY)  Patient confirms/reports the following medications:  Current Outpatient Medications  Medication Sig Dispense Refill   BAYER ASPIRIN EC LOW DOSE 81 MG EC tablet Take 81 mg by mouth daily.      COLACE 100 MG capsule Take 100 mg by mouth daily.      Multiple Vitamin (MULTIVITAMIN) LIQD Take 10 mLs by mouth daily.     Omega-3 Fatty Acids (OMEGA-3 FISH OIL PO) Take 1 capsule by mouth daily.     sildenafil  (VIAGRA ) 100 MG tablet Take 1 tablet (100 mg total) by mouth daily as needed for erectile dysfunction. 30 tablet 6   No current facility-administered medications for this visit.    Patient confirms/reports the following allergies:  Allergies  Allergen Reactions   Testosterone  Itching and Rash    No orders of the defined types were placed in this encounter.   AUTHORIZATION INFORMATION Primary Insurance: 1D#: Group #:  Secondary Insurance: 1D#: Group #:  SCHEDULE INFORMATION: Date: 01/26/24 Time: Location: [facility]

## 2023-11-17 NOTE — Telephone Encounter (Signed)
 Gastroenterology Pre-Procedure Review  Request Date: 01/26/24 Requesting Physician: Dr. Jinny  PATIENT REVIEW QUESTIONS: The patient responded to the following health history questions as indicated:    1. Are you having any GI issues? no 2. Do you have a personal history of Polyps? no 3. Do you have a family history of Colon Cancer or Polyps? no 4. Diabetes Mellitus? no 5. Joint replacements in the past 12 months?no 6. Major health problems in the past 3 months?no 7. Any artificial heart valves, MVP, or defibrillator?no    MEDICATIONS & ALLERGIES:    Patient reports the following regarding taking any anticoagulation/antiplatelet therapy:   Plavix, Coumadin, Eliquis, Xarelto, Lovenox, Pradaxa, Brilinta, or Effient? no Aspirin? yes (81 MG DAILY)  Patient confirms/reports the following medications:  Current Outpatient Medications  Medication Sig Dispense Refill   Na Sulfate-K Sulfate-Mg Sulfate concentrate (SUPREP) 17.5-3.13-1.6 GM/177ML SOLN Take 1 kit (354 mLs total) by mouth once for 1 dose. 354 mL 0   BAYER ASPIRIN EC LOW DOSE 81 MG EC tablet Take 81 mg by mouth daily.      COLACE 100 MG capsule Take 100 mg by mouth daily.      Multiple Vitamin (MULTIVITAMIN) LIQD Take 10 mLs by mouth daily.     Omega-3 Fatty Acids (OMEGA-3 FISH OIL PO) Take 1 capsule by mouth daily.     sildenafil  (VIAGRA ) 100 MG tablet Take 1 tablet (100 mg total) by mouth daily as needed for erectile dysfunction. 30 tablet 6   No current facility-administered medications for this visit.    Patient confirms/reports the following allergies:  Allergies  Allergen Reactions   Testosterone  Itching and Rash    No orders of the defined types were placed in this encounter.   AUTHORIZATION INFORMATION Primary Insurance: 1D#: Group #:  Secondary Insurance: 1D#: Group #:  SCHEDULE INFORMATION: Date: 01/26/24 Time: Location: [facility]

## 2023-11-18 NOTE — Progress Notes (Signed)
 John Benson John Benson.                                          MRN: [id number]   11/18/2023   The [facility] Quality Team Specialist reviewed this patient medical record for the purposes of chart review for care gap closure. The following were reviewed: abstraction for care gap closure-kidney health evaluation for diabetes:eGFR  and uACR.    [facility] Quality Team

## 2023-11-18 NOTE — Progress Notes (Signed)
 John Benson Kirt Mickey.                                          MRN: [id number]   11/18/2023   The [facility] Quality Team Specialist reviewed this patient medical record for the purposes of chart review for care gap closure. The following were reviewed: abstraction for care gap closure-controlling blood pressure.    [facility] Quality Team

## 2024-01-26 ENCOUNTER — Encounter: Payer: Self-pay | Admitting: Gastroenterology

## 2024-01-26 ENCOUNTER — Ambulatory Visit
Admission: RE | Admit: 2024-01-26 | Discharge: 2024-01-26 | Disposition: A | Discharge status: Home / Self Care | Attending: Gastroenterology | Admitting: Gastroenterology

## 2024-01-26 ENCOUNTER — Ambulatory Visit: Admitting: Anesthesiology

## 2024-01-26 ENCOUNTER — Encounter
Admission: RE | Disposition: A | Discharge status: Home / Self Care | Payer: Self-pay | Source: Home / Self Care | Attending: Gastroenterology

## 2024-01-26 ENCOUNTER — Other Ambulatory Visit: Payer: Self-pay

## 2024-01-26 DIAGNOSIS — E1122 Type 2 diabetes mellitus with diabetic chronic kidney disease: Secondary | ICD-10-CM | POA: Insufficient documentation

## 2024-01-26 DIAGNOSIS — N182 Chronic kidney disease, stage 2 (mild): Secondary | ICD-10-CM | POA: Insufficient documentation

## 2024-01-26 DIAGNOSIS — I129 Hypertensive chronic kidney disease with stage 1 through stage 4 chronic kidney disease, or unspecified chronic kidney disease: Secondary | ICD-10-CM | POA: Insufficient documentation

## 2024-01-26 DIAGNOSIS — Z1211 Encounter for screening for malignant neoplasm of colon: Secondary | ICD-10-CM | POA: Insufficient documentation

## 2024-01-26 DIAGNOSIS — K640 First degree hemorrhoids: Secondary | ICD-10-CM | POA: Insufficient documentation

## 2024-01-26 HISTORY — PX: COLONOSCOPY: SHX5424

## 2024-01-26 MED ORDER — SODIUM CHLORIDE 0.9 % IV SOLN: INTRAVENOUS | Status: DC

## 2024-01-26 MED ORDER — PROPOFOL 1000 MG/100ML IV EMUL
INTRAVENOUS | Status: AC
  Filled 2024-01-26: qty 100

## 2024-01-26 MED ORDER — PROPOFOL 500 MG/50ML IV EMUL
INTRAVENOUS | Status: DC | PRN
  Administered 2024-01-26: 150 ug/kg/min via INTRAVENOUS

## 2024-01-26 MED ORDER — LIDOCAINE 2% (20 MG/ML) 5 ML SYRINGE
INTRAMUSCULAR | Status: DC | PRN
  Administered 2024-01-26: 50 mg via INTRAVENOUS

## 2024-01-26 MED ORDER — PROPOFOL 10 MG/ML IV BOLUS
INTRAVENOUS | Status: DC | PRN
  Administered 2024-01-26: 60 mg via INTRAVENOUS

## 2024-01-26 NOTE — Anesthesia Preprocedure Evaluation (Addendum)
 "                                  Anesthesia Evaluation  Patient identified by MRN, date of birth, ID band Patient awake    Reviewed: Allergy & Precautions, NPO status , Patient's Chart, lab work & pertinent test results  History of Anesthesia Complications Negative for: history of anesthetic complications  Airway Mallampati: III  TM Distance: >3 FB Neck ROM: full    Dental  (+) Missing   Pulmonary neg pulmonary ROS   Pulmonary exam normal        Cardiovascular hypertension, Normal cardiovascular exam     Neuro/Psych  Neuromuscular disease  negative psych ROS   GI/Hepatic negative GI ROS, Neg liver ROS,,,  Endo/Other  diabetes, Well Controlled, Type 2    Renal/GU negative Renal ROS  negative genitourinary   Musculoskeletal   Abdominal   Peds  Hematology negative hematology ROS (+)   Anesthesia Other Findings Past Medical History: 08/20/2018: Acute kidney failure No date: Benign essential HTN No date: Calculus of kidney No date: Cancer ([name]) No date: Carpal tunnel syndrome 07/15/2014: Carpal tunnel syndrome No date: Chronic kidney disease, stage II (mild) No date: Diabetes mellitus without complication ([name])     Comment:  controlled by diet and exercise No date: History of kidney stones No date: HTN (hypertension)     Comment:  maintained by diet and exercise No date: Hyperlipidemia No date: Hypogonadism in male No date: Obesity No date: Prostate cancer [facility])  Past Surgical History: No date: COLONOSCOPY No date: LITHOTRIPSY 08/18/2018: PELVIC LYMPH NODE DISSECTION; Bilateral     Comment:  Procedure: PELVIC LYMPH NODE DISSECTION;  Surgeon:               Francisca Redell BROCKS, MD;  Location: [facility] ORS;  Service:               Urology;  Laterality: Bilateral; 08/18/2018: ROBOT ASSISTED LAPAROSCOPIC RADICAL PROSTATECTOMY; N/A     Comment:  Procedure: XI ROBOTIC ASSISTED LAPAROSCOPIC RADICAL               PROSTATECTOMY;  Surgeon: Francisca Redell BROCKS,  MD;  Location:              [facility] ORS;  Service: Urology;  Laterality: N/A;  BMI    Body Mass Index: 29.98 kg/m      Reproductive/Obstetrics negative OB ROS                              Anesthesia Physical Anesthesia Plan  ASA: 2  Anesthesia Plan: General   Post-op Pain Management: Minimal or no pain anticipated   Induction: Intravenous  PONV Risk Score and Plan: 1 and Propofol  infusion and TIVA  Airway Management Planned: Natural Airway and Nasal Cannula  Additional Equipment:   Intra-op Plan:   Post-operative Plan:   Informed Consent: I have reviewed the patients History and Physical, chart, labs and discussed the procedure including the risks, benefits and alternatives for the proposed anesthesia with the patient or authorized representative who has indicated his/her understanding and acceptance.     Dental Advisory Given  Plan Discussed with: Anesthesiologist, CRNA and Surgeon  Anesthesia Plan Comments: (Patient consented for risks of anesthesia including but not limited to:  - adverse reactions to medications - risk of airway placement if required - damage to eyes, teeth,  lips or other oral mucosa - nerve damage due to positioning  - sore throat or hoarseness - Damage to heart, brain, nerves, lungs, other parts of body or loss of life  Patient voiced understanding and assent.)         Anesthesia Quick Evaluation  "

## 2024-01-26 NOTE — H&P (Signed)
 "  Rogelia Copping, MD [facility] [address], [address] [place], [place] [address] Phone: [phone] Fax : [phone]  Primary Care Physician:  Gareth Mliss FALCON, FNP Primary Gastroenterologist:  Dr. Copping  Pre-Procedure History & Physical: HPI:  John Stai. is a 74 y.o. male is here for a screening colonoscopy.   Past Medical History:  Diagnosis Date   Acute kidney failure 08/20/2018   Benign essential HTN    Calculus of kidney    Cancer ([name])    Carpal tunnel syndrome    Carpal tunnel syndrome 07/15/2014   Chronic kidney disease, stage II (mild)    Diabetes mellitus without complication ([name])    controlled by diet and exercise   History of kidney stones    HTN (hypertension)    maintained by diet and exercise   Hyperlipidemia    Hypogonadism in male    Obesity    Prostate cancer [facility])     Past Surgical History:  Procedure Laterality Date   COLONOSCOPY     LITHOTRIPSY     PELVIC LYMPH NODE DISSECTION Bilateral 08/18/2018   Procedure: PELVIC LYMPH NODE DISSECTION;  Surgeon: Francisca Redell BROCKS, MD;  Location: [facility] ORS;  Service: Urology;  Laterality: Bilateral;   ROBOT ASSISTED LAPAROSCOPIC RADICAL PROSTATECTOMY N/A 08/18/2018   Procedure: XI ROBOTIC ASSISTED LAPAROSCOPIC RADICAL PROSTATECTOMY;  Surgeon: Francisca Redell BROCKS, MD;  Location: [facility] ORS;  Service: Urology;  Laterality: N/A;    Prior to Admission medications  Medication Sig Start Date End Date Taking? Authorizing Provider  BAYER ASPIRIN EC LOW DOSE 81 MG EC tablet Take 81 mg by mouth daily.  01/29/19  Yes [provider]  COLACE 100 MG capsule Take 100 mg by mouth daily.  01/29/19  Yes [provider]  Multiple Vitamin (MULTIVITAMIN) LIQD Take 10 mLs by mouth daily.   Yes [provider]  Omega-3 Fatty Acids (OMEGA-3 FISH OIL PO) Take 1 capsule by mouth daily.   Yes [provider]  sildenafil  (VIAGRA ) 100 MG tablet Take 1 tablet (100 mg total) by mouth daily as needed for erectile  dysfunction. 08/31/23   Francisca Redell BROCKS, MD    Allergies as of 11/17/2023 - Review Complete 11/16/2023  Allergen Reaction Noted   Testosterone  Itching and Rash 06/24/2014    Family History  Problem Relation Age of Onset   Diabetes Mother    Cancer Mother        breast   Vision loss Mother    Hypertension Father    Dementia Father    Diabetes Brother    Early death Brother    Pancreatic disease Brother    Early death Sister    Kidney disease Sister    Kidney cancer Neg Hx    Prostate cancer Neg Hx     Social History   Socioeconomic History   Marital status: Married    Spouse name: Not on file   Number of children: Not on file   Years of education: Not on file   Highest education level: Bachelor's degree (e.g., BA, AB, BS)  Occupational History   Not on file  Tobacco Use   Smoking status: Never    Passive exposure: Never   Smokeless tobacco: Never  Vaping Use   Vaping status: Never Used  Substance and Sexual Activity   Alcohol use: No   Drug use: No   Sexual activity: Yes  Other Topics Concern   Not on file  Social History Narrative   Not on  file   Social Drivers of Health   Tobacco Use: Low Risk (01/26/2024)   Patient History    Smoking Tobacco Use: Never    Smokeless Tobacco Use: Never    Passive Exposure: Never  Financial Resource Strain: Low Risk (11/13/2023)   Overall Financial Resource Strain (CARDIA)    Difficulty of Paying Living Expenses: Not hard at all  Food Insecurity: Unknown (11/13/2023)   Epic    Worried About Programme Researcher, Broadcasting/film/video in the Last Year: Never true    [employer] of Food in the Last Year: Not on file  Transportation Needs: No Transportation Needs (11/13/2023)   Epic    Lack of Transportation (Medical): No    Lack of Transportation (Non-Medical): No  Physical Activity: Sufficiently Active (02/10/2023)   Exercise Vital Sign    Days of Exercise per Week: 7 days    Minutes of Exercise per Session: 60 min  Stress: No Stress Concern  Present (02/10/2023)   [employer] of Occupational Health - Occupational Stress Questionnaire    Feeling of Stress : Only a little  Social Connections: Moderately Integrated (11/13/2023)   Social Connection and Isolation Panel    Frequency of Communication with Friends and Family: Once a week    Frequency of Social Gatherings with Friends and Family: Once a week    Attends Religious Services: More than 4 times per year    Active Member of [employer] or Organizations: Yes    Attends Engineer, Structural: More than 4 times per year    Marital Status: Married  Catering Manager Violence: Not At Risk (02/10/2023)   Humiliation, Afraid, Rape, and Kick questionnaire    Fear of Current or Ex-Partner: No    Emotionally Abused: No    Physically Abused: No    Sexually Abused: No  Depression (PHQ2-9): Low Risk (02/10/2023)   Depression (PHQ2-9)    PHQ-2 Score: 0  Alcohol Screen: Low Risk (02/10/2023)   Alcohol Screen    Last Alcohol Screening Score (AUDIT): 0  Housing: Low Risk (11/13/2023)   Epic    Unable to Pay for Housing in the Last Year: No    Number of Times Moved in the Last Year: 0    Homeless in the Last Year: No  Utilities: Not At Risk (02/10/2023)   AHC Utilities    Threatened with loss of utilities: No  Health Literacy: Adequate Health Literacy (02/10/2023)   B1300 Health Literacy    Frequency of need for help with medical instructions: Never    Review of Systems: See HPI, otherwise negative ROS  Physical Exam: BP (!) 143/75   Pulse 62   Temp (!) 96.7 F (35.9 C) (Temporal)   Resp 16   Ht 5' 9.5 (1.765 m)   Wt 93.4 kg   SpO2 100%   BMI 29.98 kg/m  General:   Alert,  pleasant and cooperative in NAD Head:  Normocephalic and atraumatic. Neck:  Supple; no masses or thyromegaly. Lungs:  Clear throughout to auscultation.    Heart:  Regular rate and rhythm. Abdomen:  Soft, nontender and nondistended. Normal bowel sounds, without guarding, and without rebound.    Neurologic:  Alert and  oriented x4;  grossly normal neurologically.  Impression/Plan: John VEAR Kirt Mickey. is now here to undergo a screening colonoscopy.  Risks, benefits, and alternatives regarding colonoscopy have been reviewed with the patient.  Questions have been answered.  All parties agreeable. "

## 2024-01-26 NOTE — Transfer of Care (Signed)
 Immediate Anesthesia Transfer of Care Note  Patient: John Benson.  Procedure(s) Performed: COLONOSCOPY  Patient Location: Endoscopy Unit  Anesthesia Type:General  Level of Consciousness: drowsy  Airway & Oxygen Therapy: Patient Spontanous Breathing  Post-op Assessment: Report given to RN and Post -op Vital signs reviewed and stable  Post vital signs: Reviewed and stable  Last Vitals:  Vitals Value Taken Time  BP 91/53 01/26/24 08:03  Temp    Pulse 57 01/26/24 08:03  Resp 16 01/26/24 08:03  SpO2 100 % 01/26/24 08:03  Vitals shown include unfiled device data.  Last Pain:  Vitals:   01/26/24 0710  TempSrc: Temporal  PainSc: 0-No pain         Complications: No notable events documented.

## 2024-01-26 NOTE — Anesthesia Postprocedure Evaluation (Signed)
"   Anesthesia Post Note  Patient: John Benson.  Procedure(s) Performed: COLONOSCOPY  Patient location during evaluation: Endoscopy Anesthesia Type: General Level of consciousness: awake and alert Pain management: pain level controlled Vital Signs Assessment: post-procedure vital signs reviewed and stable Respiratory status: spontaneous breathing, nonlabored ventilation, respiratory function stable and patient connected to nasal cannula oxygen Cardiovascular status: blood pressure returned to baseline and stable Postop Assessment: no apparent nausea or vomiting Anesthetic complications: no   No notable events documented.   Last Vitals:  Vitals:   01/26/24 0813 01/26/24 0823  BP: 114/69   Pulse:    Resp: 16   Temp:    SpO2: 100% 100%    Last Pain:  Vitals:   01/26/24 0823  TempSrc:   PainSc: 0-No pain                 [name]      "

## 2024-01-26 NOTE — Op Note (Signed)
 [facility]enter[facility] Patient Name: John Benson Procedure Date: 01/26/2024 6:53 AM MRN: [id number] Account #: [id number] Date of Birth: Jun 05, 1950 Admit Type: Outpatient Age: 74 Room: [facility] ENDO ROOM 4 Gender: Male Note Status: Finalized Instrument Name: Colon Scope [id number] Procedure:             Colonoscopy Indications:           Screening for colorectal malignant neoplasm Providers:             Rogelia Copping MD, MD Referring MD:          Mliss FALCON. Gareth (Referring MD) Medicines:             Propofol  per Anesthesia Complications:         No immediate complications. Procedure:             Pre-Anesthesia Assessment:                        - Prior to the procedure, a History and Physical was                         performed, and patient medications and allergies were                         reviewed. The patient's tolerance of previous                         anesthesia was also reviewed. The risks and benefits                         of the procedure and the sedation options and risks                         were discussed with the patient. All questions were                         answered, and informed consent was obtained. Prior                         Anticoagulants: The patient has taken no anticoagulant                         or antiplatelet agents. ASA Grade Assessment: II - A                         patient with mild systemic disease. After reviewing                         the risks and benefits, the patient was deemed in                         satisfactory condition to undergo the procedure.                        After obtaining informed consent, the colonoscope was                         passed under direct vision. Throughout the procedure,  the patient's blood pressure, pulse, and oxygen                         saturations were monitored continuously. The                         Colonoscope was introduced through the  anus and                         advanced to the the cecum, identified by appendiceal                         orifice and ileocecal valve. The colonoscopy was                         performed without difficulty. The patient tolerated                         the procedure well. The quality of the bowel                         preparation was excellent. Findings:      The perianal and digital rectal examinations were normal.      Non-bleeding internal hemorrhoids were found during retroflexion. The       hemorrhoids were Grade I (internal hemorrhoids that do not prolapse). Impression:            - Non-bleeding internal hemorrhoids.                        - No specimens collected. Recommendation:        - Discharge patient to home.                        - Resume previous diet.                        - Continue present medications.                        - Repeat colonoscopy in 10 years for screening                         purposes. Procedure Code(s):     --- Professional ---                        [phone], Colonoscopy, flexible; diagnostic, including                         collection of specimen(s) by brushing or washing, when                         performed (separate procedure) Diagnosis Code(s):     --- Professional ---                        Z12.11, Encounter for screening for malignant neoplasm                         of colon CPT copyright 2022 American Medical Association. All rights reserved. The  codes documented in this report are preliminary and upon coder review may  be revised to meet current compliance requirements. Rogelia Copping MD, MD 01/26/2024 8:01:25 AM This report has been signed electronically. Number of Addenda: 0 Note Initiated On: 01/26/2024 6:53 AM Scope Withdrawal Time: 0 hours 9 minutes 15 seconds  Total Procedure Duration: 0 hours 11 minutes 37 seconds  Estimated Blood Loss:  Estimated blood loss: none.      [facility]

## 2024-02-16 ENCOUNTER — Ambulatory Visit: Payer: PPO

## 2024-05-15 ENCOUNTER — Ambulatory Visit: Admitting: Nurse Practitioner

## 2024-08-22 ENCOUNTER — Other Ambulatory Visit

## 2024-08-29 ENCOUNTER — Ambulatory Visit: Admitting: Urology
